# Patient Record
Sex: Male | Born: 1950 | Race: Black or African American | Hispanic: No | Marital: Married | Smoking: Current some day smoker
Health system: Southern US, Community
[De-identification: ages and names within clinical notes are randomized; demographics above are authoritative.]

## PROBLEM LIST (undated history)

## (undated) DIAGNOSIS — I2699 Other pulmonary embolism without acute cor pulmonale: Secondary | ICD-10-CM

## (undated) DIAGNOSIS — J45909 Unspecified asthma, uncomplicated: Secondary | ICD-10-CM

## (undated) DIAGNOSIS — M1990 Unspecified osteoarthritis, unspecified site: Secondary | ICD-10-CM

## (undated) DIAGNOSIS — I10 Essential (primary) hypertension: Secondary | ICD-10-CM

## (undated) DIAGNOSIS — K219 Gastro-esophageal reflux disease without esophagitis: Secondary | ICD-10-CM

## (undated) DIAGNOSIS — Z973 Presence of spectacles and contact lenses: Secondary | ICD-10-CM

## (undated) DIAGNOSIS — L309 Dermatitis, unspecified: Secondary | ICD-10-CM

## (undated) DIAGNOSIS — E785 Hyperlipidemia, unspecified: Secondary | ICD-10-CM

## (undated) DIAGNOSIS — M316 Other giant cell arteritis: Secondary | ICD-10-CM

## (undated) HISTORY — PX: WISDOM TOOTH EXTRACTION: SHX21

## (undated) HISTORY — PX: HERNIA REPAIR: SHX51

## (undated) HISTORY — DX: Hyperlipidemia, unspecified: E78.5

## (undated) HISTORY — DX: Other giant cell arteritis: M31.6

## (undated) HISTORY — PX: EYE SURGERY: SHX253

## (undated) HISTORY — DX: Dermatitis, unspecified: L30.9

## (undated) HISTORY — PX: TONSILLECTOMY: SUR1361

## (undated) HISTORY — DX: Gastro-esophageal reflux disease without esophagitis: K21.9

## (undated) HISTORY — DX: Essential (primary) hypertension: I10

---

## 1983-10-26 HISTORY — PX: TRACHEAL SURGERY: SHX1096

## 1998-10-25 HISTORY — PX: KNEE SURGERY: SHX244

## 1999-10-26 HISTORY — PX: UMBILICAL HERNIA REPAIR: SHX196

## 2001-10-16 ENCOUNTER — Ambulatory Visit (HOSPITAL_COMMUNITY): Admission: RE | Admit: 2001-10-16 | Discharge: 2001-10-16 | Payer: Self-pay | Admitting: Gastroenterology

## 2002-10-25 DIAGNOSIS — G4730 Sleep apnea, unspecified: Secondary | ICD-10-CM

## 2002-10-25 HISTORY — DX: Sleep apnea, unspecified: G47.30

## 2003-10-26 HISTORY — PX: NASAL SEPTUM SURGERY: SHX37

## 2003-11-28 ENCOUNTER — Ambulatory Visit (HOSPITAL_BASED_OUTPATIENT_CLINIC_OR_DEPARTMENT_OTHER): Admission: RE | Admit: 2003-11-28 | Discharge: 2003-11-28 | Payer: Self-pay | Admitting: Otolaryngology

## 2004-11-27 ENCOUNTER — Ambulatory Visit (HOSPITAL_COMMUNITY): Admission: RE | Admit: 2004-11-27 | Discharge: 2004-11-28 | Payer: Self-pay | Admitting: Otolaryngology

## 2005-04-19 ENCOUNTER — Ambulatory Visit: Admission: RE | Admit: 2005-04-19 | Discharge: 2005-04-19 | Payer: Self-pay | Admitting: Family Medicine

## 2005-04-19 ENCOUNTER — Ambulatory Visit (HOSPITAL_BASED_OUTPATIENT_CLINIC_OR_DEPARTMENT_OTHER): Admission: RE | Admit: 2005-04-19 | Discharge: 2005-04-19 | Payer: Self-pay | Admitting: Otolaryngology

## 2005-04-25 ENCOUNTER — Ambulatory Visit: Payer: Self-pay | Admitting: Internal Medicine

## 2010-10-25 HISTORY — PX: TOTAL KNEE ARTHROPLASTY: SHX125

## 2010-10-25 HISTORY — PX: JOINT REPLACEMENT: SHX530

## 2010-10-29 LAB — DIFFERENTIAL
Basophils Absolute: 0 10*3/uL (ref 0.0–0.1)
Basophils Relative: 1 % (ref 0–1)
Eosinophils Absolute: 0.3 10*3/uL (ref 0.0–0.7)
Eosinophils Relative: 7 % — ABNORMAL HIGH (ref 0–5)
Lymphocytes Relative: 24 % (ref 12–46)
Lymphs Abs: 1.1 10*3/uL (ref 0.7–4.0)
Monocytes Absolute: 0.4 10*3/uL (ref 0.1–1.0)
Monocytes Relative: 8 % (ref 3–12)
Neutro Abs: 2.8 10*3/uL (ref 1.7–7.7)
Neutrophils Relative %: 61 % (ref 43–77)

## 2010-10-29 LAB — COMPREHENSIVE METABOLIC PANEL
ALT: 29 U/L (ref 0–53)
AST: 27 U/L (ref 0–37)
Albumin: 4 g/dL (ref 3.5–5.2)
Alkaline Phosphatase: 64 U/L (ref 39–117)
BUN: 16 mg/dL (ref 6–23)
CO2: 26 mEq/L (ref 19–32)
Calcium: 10 mg/dL (ref 8.4–10.5)
Chloride: 107 mEq/L (ref 96–112)
Creatinine, Ser: 1.48 mg/dL (ref 0.4–1.5)
GFR calc Af Amer: 59 mL/min — ABNORMAL LOW (ref >60)
GFR calc non Af Amer: 48 mL/min — ABNORMAL LOW (ref >60)
Glucose, Bld: 87 mg/dL (ref 70–99)
Potassium: 4.2 mEq/L (ref 3.5–5.1)
Sodium: 139 mEq/L (ref 135–145)
Total Bilirubin: 1.1 mg/dL (ref 0.3–1.2)
Total Protein: 7.6 g/dL (ref 6.0–8.3)

## 2010-10-29 LAB — URINALYSIS, ROUTINE W REFLEX MICROSCOPIC
Bilirubin Urine: NEGATIVE
Hemoglobin, Urine: NEGATIVE
Ketones, ur: NEGATIVE mg/dL
Nitrite: NEGATIVE
Protein, ur: NEGATIVE mg/dL
Specific Gravity, Urine: 1.019 (ref 1.005–1.030)
Urine Glucose, Fasting: NEGATIVE mg/dL
Urobilinogen, UA: 1 mg/dL (ref 0.0–1.0)
pH: 7 (ref 5.0–8.0)

## 2010-10-29 LAB — PROTIME-INR
INR: 0.99 (ref 0.00–1.49)
Prothrombin Time: 13.3 seconds (ref 11.6–15.2)

## 2010-10-29 LAB — CBC
HCT: 43.3 % (ref 39.0–52.0)
Hemoglobin: 14.6 g/dL (ref 13.0–17.0)
MCH: 31.1 pg (ref 26.0–34.0)
MCHC: 33.7 g/dL (ref 30.0–36.0)
MCV: 92.1 fL (ref 78.0–100.0)
Platelets: 182 10*3/uL (ref 150–400)
RBC: 4.7 MIL/uL (ref 4.22–5.81)
RDW: 13.1 % (ref 11.5–15.5)
WBC: 4.6 10*3/uL (ref 4.0–10.5)

## 2010-10-29 LAB — APTT: aPTT: 29 seconds (ref 24–37)

## 2010-11-04 ENCOUNTER — Inpatient Hospital Stay (HOSPITAL_COMMUNITY)
Admission: RE | Admit: 2010-11-04 | Discharge: 2010-11-08 | Payer: Self-pay | Source: Home / Self Care | Attending: Orthopedic Surgery | Admitting: Orthopedic Surgery

## 2010-11-09 LAB — COMPREHENSIVE METABOLIC PANEL
ALT: 19 U/L (ref 0–53)
AST: 23 U/L (ref 0–37)
Albumin: 2.8 g/dL — ABNORMAL LOW (ref 3.5–5.2)
Alkaline Phosphatase: 53 U/L (ref 39–117)
BUN: 12 mg/dL (ref 6–23)
CO2: 29 mEq/L (ref 19–32)
Calcium: 9.1 mg/dL (ref 8.4–10.5)
Chloride: 97 mEq/L (ref 96–112)
Creatinine, Ser: 1.37 mg/dL (ref 0.4–1.5)
GFR calc Af Amer: >60 mL/min (ref >60)
GFR calc non Af Amer: 53 mL/min — ABNORMAL LOW (ref >60)
Glucose, Bld: 114 mg/dL — ABNORMAL HIGH (ref 70–99)
Potassium: 3.8 mEq/L (ref 3.5–5.1)
Sodium: 134 mEq/L — ABNORMAL LOW (ref 135–145)
Total Bilirubin: 1.5 mg/dL — ABNORMAL HIGH (ref 0.3–1.2)
Total Protein: 6.4 g/dL (ref 6.0–8.3)

## 2010-11-09 LAB — DIFFERENTIAL
Basophils Absolute: 0 10*3/uL (ref 0.0–0.1)
Basophils Relative: 0 % (ref 0–1)
Eosinophils Absolute: 0.1 10*3/uL (ref 0.0–0.7)
Eosinophils Relative: 1 % (ref 0–5)
Lymphocytes Relative: 4 % — ABNORMAL LOW (ref 12–46)
Lymphs Abs: 0.5 10*3/uL — ABNORMAL LOW (ref 0.7–4.0)
Monocytes Absolute: 1.2 10*3/uL — ABNORMAL HIGH (ref 0.1–1.0)
Monocytes Relative: 9 % (ref 3–12)
Neutro Abs: 11 10*3/uL — ABNORMAL HIGH (ref 1.7–7.7)
Neutrophils Relative %: 86 % — ABNORMAL HIGH (ref 43–77)

## 2010-11-09 LAB — CBC
HCT: 29.3 % — ABNORMAL LOW (ref 39.0–52.0)
HCT: 25.2 % — ABNORMAL LOW (ref 39.0–52.0)
Hemoglobin: 10.2 g/dL — ABNORMAL LOW (ref 13.0–17.0)
Hemoglobin: 8.4 g/dL — ABNORMAL LOW (ref 13.0–17.0)
MCH: 32 pg (ref 26.0–34.0)
MCH: 31 pg (ref 26.0–34.0)
MCHC: 34.8 g/dL (ref 30.0–36.0)
MCHC: 33.3 g/dL (ref 30.0–36.0)
MCV: 91.8 fL (ref 78.0–100.0)
MCV: 93 fL (ref 78.0–100.0)
Platelets: 122 10*3/uL — ABNORMAL LOW (ref 150–400)
Platelets: 156 10*3/uL (ref 150–400)
RBC: 3.19 MIL/uL — ABNORMAL LOW (ref 4.22–5.81)
RBC: 2.71 MIL/uL — ABNORMAL LOW (ref 4.22–5.81)
RDW: 13.1 % (ref 11.5–15.5)
RDW: 13.2 % (ref 11.5–15.5)
WBC: 12.7 10*3/uL — ABNORMAL HIGH (ref 4.0–10.5)
WBC: 10.8 10*3/uL — ABNORMAL HIGH (ref 4.0–10.5)

## 2010-11-09 LAB — URINE MICROSCOPIC-ADD ON

## 2010-11-09 LAB — PROTIME-INR
INR: 1.08 (ref 0.00–1.49)
INR: 1.65 — ABNORMAL HIGH (ref 0.00–1.49)
INR: 1.56 — ABNORMAL HIGH (ref 0.00–1.49)
INR: 1.37 (ref 0.00–1.49)
Prothrombin Time: 14.2 seconds (ref 11.6–15.2)
Prothrombin Time: 19.7 seconds — ABNORMAL HIGH (ref 11.6–15.2)
Prothrombin Time: 18.9 seconds — ABNORMAL HIGH (ref 11.6–15.2)
Prothrombin Time: 17.1 seconds — ABNORMAL HIGH (ref 11.6–15.2)

## 2010-11-09 LAB — URINALYSIS, ROUTINE W REFLEX MICROSCOPIC
Bilirubin Urine: NEGATIVE
Ketones, ur: NEGATIVE mg/dL
Leukocytes, UA: NEGATIVE
Nitrite: NEGATIVE
Protein, ur: NEGATIVE mg/dL
Specific Gravity, Urine: 1.024 (ref 1.005–1.030)
Urine Glucose, Fasting: NEGATIVE mg/dL
Urobilinogen, UA: 0.2 mg/dL (ref 0.0–1.0)
pH: 7.5 (ref 5.0–8.0)

## 2010-11-09 LAB — URINE CULTURE
Colony Count: NO GROWTH
Culture  Setup Time: 201201140141
Culture: NO GROWTH
Special Requests: NEGATIVE

## 2010-11-09 LAB — HEMOGLOBIN AND HEMATOCRIT, BLOOD
HCT: 36 % — ABNORMAL LOW (ref 39.0–52.0)
HCT: 30.3 % — ABNORMAL LOW (ref 39.0–52.0)
HCT: 27.2 % — ABNORMAL LOW (ref 39.0–52.0)
Hemoglobin: 12 g/dL — ABNORMAL LOW (ref 13.0–17.0)
Hemoglobin: 10.1 g/dL — ABNORMAL LOW (ref 13.0–17.0)
Hemoglobin: 9.2 g/dL — ABNORMAL LOW (ref 13.0–17.0)

## 2010-11-09 LAB — ABO/RH: ABO/RH(D): O POS

## 2010-11-09 LAB — TYPE AND SCREEN
ABO/RH(D): O POS
Antibody Screen: NEGATIVE

## 2010-11-09 LAB — TSH: TSH: 0.779 u[IU]/mL (ref 0.350–4.500)

## 2010-11-19 LAB — SURGICAL PCR SCREEN: MRSA, PCR: NEGATIVE

## 2010-11-29 NOTE — Consult Note (Signed)
NAME:  Jesse [name], Jesse [name] NO.:  [id number]  MEDICAL RECORD NO.:  [id number]          PATIENT TYPE:  INP  LOCATION:  1612                         FACILITY:  [facility]  PHYSICIAN:  Jesse Scott, MD     DATE OF BIRTH:  09-12-1951  DATE OF CONSULTATION:  11/06/2010 DATE OF DISCHARGE:                                CONSULTATION   PRIMARY CARE PHYSICIAN:  C. Duane Lope, MD  REFERRING PHYSICIAN:  Georges Lynch. Gioffre, MD  REASON FOR CONSULTATION:  Tachycardia and transient hypoxemia.  CHIEF COMPLAINT:  Right knee pain.  HISTORY OF PRESENT ILLNESS:  Jesse [name] is a pleasant 60 year old African American male patient with history of hypertension, remote pulmonary embolism after arthroscopy who completed 6 months of anticoagulation in 2000 and 2001, who had right total knee arthroplasty by Jesse [name] on the 11th of January 2012 after medical clearance.  Today, the [facility] were asked to consult because of tachycardia and transient hypoxemia.  The patient indicates that he had significant right knee pain yesterday, which was almost 10/10 and hence was receiving significant amount of pain medications, which made him "oozy" through this morning.  He, however, denies having any chest pain, dyspnea, palpitations  at any time.  He indicates that his right knee pain is significantly better to 4/10.  He was out of bed to chair and ambulated to the bathroom with physical therapy and felt slightly dizzy on ambulation, but did not pass out.  The patient was noted to have an oxygen saturation of 87% on room air with a heart rate of 116 while in bed and after sitting at the edge of the bed, his sats were 90% and heart rate went up to 130.  Subsequently, they moved him to be a recliner when his oxygen saturations were 87% with a heart rate of 121. These findings were discussed with the orthopedic nurse practitioner who consulted the [facility] for evaluation and management.   As indicated above, apart from mild right knee pain, which is significantly better than yesterday,the patient denies any complaints.  He indicates that he is urinating frequently.  He indicates his appetite is fair and has been drinking liquids.  He has passed flatus, but has not had a BM. The patient denies any cough or nausea or vomiting or dysuria.  PAST MEDICAL HISTORY: 1. Hypertension. 2. Pulmonary embolism after arthroscopy in 2000 for which the patient     completed 6 months of anticoagulation. 3. Gastroesophageal reflux disease, apparently resolved. 4. Obstructive sleep apnea, resolved after he had a nasal septoplasty     in 2005. 5. Diverticulosis on colonoscopy. 6. Mild pulmonary sarcoid in 1985.  PAST SURGICAL HISTORY: 1. Nasal septoplasty in 2005. 2. Strabismus surgery in 1959. 3. Tonsillectomy in 1969. 4. Teeth extraction followed by emergency tracheostomy with for what     looks like retropharyngeal abscess in 1986. 5. Right knee meniscectomy in 2000. 6. Umbilical hernia repair in 2001. 7. Colonoscopy in 2010, which revealed diverticulosis.  ALLERGIES:  The patient is allergic to ADHESIVE TAPES, but does tolerate paper tapes.  HOME MEDICATIONS: 1. Aspirin enteric coated  81 mg p.o. daily. 2. Zyrtec 10 mg p.o. daily p.r.n. 3. Aleve 440 mg p.o. daily. 4. Hydrochlorothiazide 12.5 mg p.o. daily.  Current medications in the hospital include: Colace, Ferrous sulfate,Heparin for DVT prophylaxis, Hydrochlorothiazide, Coumadin, Tylenol, aluminum hydroxide suspension, Dulcolax,Benadryl p.r.n, Other p.r.n. medications such as Cepacol, Robaxin, Reglan,  Zofran, Percocet, Bowel regimens.  FAMILY HISTORY:  The patient's father died at age 33 years.  He had prostate cancer.  The patient's mother died at age 60 years and she had ALS.  SOCIAL HISTORY:  The patient is married.  He is independent of activities of daily living prior to surgery.  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  He is occasionally smoked and has quit more than a year ago. He drinks social alcohol socially including scotch.  There is no history of drug abuse.  ADVANCE DIRECTIVES:  The patient is a full code.  REVIEW OF SYSTEMS:  All systems reviewed and apart from history of presenting illness is negative.  PHYSICAL EXAMINATION:  GENERAL:  Jesse [name] is a moderately built and nourished male, the patient who is in no obvious distress. VITAL SIGNS:  Temperature 99.7 degrees Fahrenheit, pulse of 121 per minute, respirations 20 per minute, blood pressure 110/75 mmHg, and saturating at 94% on room air. HEENT:  Nontraumatic normocephalic.  Pupils equally reacting to light and accommodation.  Oral mucosa is mildly dry. NECK:  Supple.  No JVD or carotid bruit. LYMPHATICS:  No lymphadenopathy. RESPIRATORY:  Clear to auscultation.  No increased work of breathing. CARDIOVASCULAR:  S1 and S2 sounds heard.  Regular tachycardic.  No murmurs, rubs, gallops, or clicks. ABDOMEN:  Nondistended, nontender, soft, and bowel sounds normally heard.  No organomegaly or mass appreciated. CENTRAL NERVOUS SYSTEM:  The patient is awake, alert, oriented x3 with no focal neurological deficits. EXTREMITIES:  With right lower extremity is in continuous passive motion.  All other extremities are grade 5/5 power with no cyanosis, clubbing, or edema.  Peripheral pulse is symmetrically felt.  The back cannot be examined secondary to patient being in the CPM.  LABORATORY DATA:  1. EKG: sinus tachycardia at 134 beats per minute with PR     interval 166, and QTC of 433 milliseconds.  Axis seems to be     normal.  Question partial right bundle-branch block, but otherwise     no acute changes. 2. Blood tests, INR is 1.65.  Hemoglobin is 10.1, hematocrit 30.3. Urinalysis on January 5th was negative and the comprehensive metabolic panel on January 5th had shown a  creatinine of 1.48, but otherwise unremarkable.  His hemoglobin on January 5th was 14.6. 3. CT Angiogram Chest:  IMPRESSION:    1.  No evidence of pulmonary embolism.   2.  Mild bilateral hilar and mediastinal lymphadenopathy with   central calcification.  Bilateral noncalcified pleural plaque, and   multiple less than 1 cm indeterminate bilateral pulmonary nodules.   Differential diagnosis includes a calcified old granulomatous   disease, sarcoidosis, pneumoconiosis such as silicosis or coal   worker's pneumoconiosis, and treated lymphoma.   ASSESSMENT AND PLAN: 1. Postoperative acute blood loss anemia.  Monitor daily CBCs.     Transfuse if hemoglobin is less than or equal to 6.9 gm/dL. 2. Sinus tachycardia, asymptomatic.  This is multifactorial secondary     to right knee pain, acute blood loss anemia.  The patient is     asymptomatic and a CT angiogram of the chest  is negative for     pulmonary embolism. The patient may also be mildly volume depleted.     We will briefly hydrate with IV fluids and hold his     hydrochlorothiazide and monitor. The patient has no overt features of     sepsis.  He has a low grade temperature, which can be attributed to     the recent surgery. Obviously if he spikes a high temperature then           consider a sepsis workup. 3. Transient hypoxemia secondary to sedation from his pain     medications.  Again, CT angiogram is negative for pulmonary     embolism.  The patient does not demonstrate any features of heart     failure.  Currently, the patient is oxygenating at 95% on room air.     We will provide incentive spirometry and minimize pain medications if     the patient becomes sedated 4. Hypertension, which is controlled, but will hold his     hydrochlorothiazide temporarily. 5. Status post right total knee arthroplasty, postoperative day #2.     The patient is on prophylactic anticoagulation as per orthopedics MD.     Management per the primary  team. 6. The patient is a full code status.   7. Pulmonary nodules and lymphadenopathy on CT chest; possibly from Sarcoidosis, but consider outpatient followup and evaluation by his primary care physician, as deemed necessary.  Time taken in coordinating this consult note was an hour.     Jesse Scott, MD     AH/MEDQ  D:  11/06/2010  T:  11/06/2010  Job:  [id number]  cc:   Jesse Lynch. Jesse [name], M.D. Fax: [phone]  Electronically Signed by Jesse Scott MD on 11/29/2010 11:41:01 AM

## 2011-03-12 NOTE — Op Note (Signed)
[facility]  Patient:    Jesse [name], Jesse [name] Visit Number: [id number] MRN: [id number]          Service Type: END Location: ENDO Attending Physician:  Louie Bun Dictated by:   Everardo All Madilyn Fireman, M.D. Proc. Date: 10/16/01 Admit Date:  10/16/2001   CC:         Dellis Anes. Idell Pickles, M.D.   Operative Report  PROCEDURE:  Colonoscopy.  INDICATIONS:  Occasional rectal bleeding a 60 year old patient with no previous colon screening.  PROCEDURE:  The patient was placed in the left lateral decubitus position and placed on the pulse monitor with continuous low flow oxygen delivered by nasal cannula. He was sedated with 80 mg IV Demerol and 6 mg IV Versed. The Olympus video colonoscope was inserted into the rectum and advanced to the cecum, confirmed by transillumination at McBurneys point and visualization of the ileocecal valve and appendiceal orifice. The prep was good. The cecum, ascending, transverse, descending, and sigmoid colon all appeared normal with no masses, polyps, diverticula or any other mucosal abnormalities. The rectum appeared normal but under the anus where only small internal hemorrhoids were seen on the retroflex view with no stigma of hemorrhage. The colonoscope was then withdrawn and the patient returned to the recovery room in stable condition. He tolerated the procedure well and there were no immediate complications.  IMPRESSION: 1. Small internal hemorrhoids. 2. Otherwise normal colonoscopy.  PLAN:  Treat hemorrhoids symptomatically and plan flexible sigmoidoscopy and Hemoccults in five years. Dictated by:   Everardo All Madilyn Fireman, M.D. Attending Physician:  Louie Bun DD:  10/16/01 TD:  10/17/01 Job: [id number]

## 2011-03-12 NOTE — Procedures (Signed)
NAME:  [name], Jesse               ACCOUNT NO.:  [id number]   MEDICAL RECORD NO.:  [id number]          PATIENT TYPE:  OUT   LOCATION:  SLEEP CENTER                 FACILITY:  [facility]   PHYSICIAN:  Clinton D. Maple Hudson, M.D. DATE OF BIRTH:  03-30-1951   DATE OF STUDY:  04/19/2005                              NOCTURNAL POLYSOMNOGRAM   REFERRING PHYSICIAN:  Onalee Hua L. Annalee Genta, M.D.   INDICATION FOR STUDY:  Hypersomnia with sleep apnea.   Epworth Sleepiness Score 9/24, BMI 31, weight 210 pounds.   Baseline diagnostic NPSG on November 28, 2003, recorded an RDI of 22 per hour  with CPAP titration to 7 CWP (RDI 5.5 per hour).  The patient is now post  surgery.   SLEEP ARCHITECTURE:  Total sleep time 339 minutes, sleep efficiency 90%.  Stage I 2%, stage II 765, stages III and IV were absent, REM 21% of total  sleep time.  Sleep latency 9 minutes, REM latency 58 minutes.  Awake after  sleep onset 18 minutes, arousal index 16.   RESPIRATORY DATA:  Respiratory disturbance index (RDI, AHI) 32.2 obstructive  events per hour indicating moderate obstructive sleep apnea/hypopnea  syndrome.  This includes 1 obstructive apnea, 1 central apnea, and 59  hypopneas before CPAP.  All sleep and all events were recorded while supine.  REM RDI 14.  CPAP titration to 11 CWP, RDI zero per hour, using a medium  Comfort Gel mask with heated humidifier.   OXYGEN DATA:  Moderate snoring with oxygen desaturation to a nadir of 63%.  After CPAP control, snoring was eliminated, and oxygen saturation held 95 to  98% on room air.   CARDIAC DATA:  Normal sinus rhythm with sinus bradycardia to 58 per minute.   MOVEMENT/PARASOMNIA:  Occasional leg jerk, insignificant.   IMPRESSION/RECOMMENDATION:  1.  Moderate obstructive sleep apnea/hypopnea syndrome, RDI 32 per hour with      moderate snoring and oxygen desaturation to 63%.  2.  Successful CPAP titration to 11 CWP, RDI zero per hour, using a medium      Comfort Gel  mask with heated      humidifier.  3.  Baseline diagnostic NPSG December 26, 2003, RDI 22 per hour.      Clinton D. Maple Hudson, M.D.  Diplomat    CDY/MEDQ  D:  04/25/2005 11:25:56  T:  04/25/2005 18:07:28  Job:  [id number]   cc:   Onalee Hua L. Annalee Genta, M.D.  [address] [place]  [place] [address]  Fax: [phone]

## 2011-03-12 NOTE — Op Note (Signed)
NAME:  [name] [name]               ACCOUNT NO.:  [id number]   MEDICAL RECORD NO.:  [id number]          PATIENT TYPE:  OIB   LOCATION:  2550                         FACILITY:  [facility]   PHYSICIAN:  Kinnie Scales. Annalee Genta, M.D.DATE OF BIRTH:  October 30, 1950   DATE OF PROCEDURE:  11/27/2004  DATE OF DISCHARGE:                                 OPERATIVE REPORT   PREOPERATIVE DIAGNOSES:  1.  Deviated nasal septum with nasal obstruction.  2.  Inferior turbinate hypertrophy.  3.  Mild obstructive sleep apnea.   POSTOPERATIVE DIAGNOSES:  1.  Deviated nasal septum with nasal obstruction.  2.  Inferior turbinate hypertrophy.  3.  Mild obstructive sleep apnea.   INDICATION FOR PROCEDURE:  1.  Deviated nasal septum with nasal obstruction.  2.  Inferior turbinate hypertrophy.  3.  Mild obstructive sleep apnea.   SURGICAL PROCEDURE:  1.  Nasal septoplasty.  2.  Bilateral inferior turbinate reduction.   SURGEON:  Kinnie Scales. Annalee Genta, M.D.   ANESTHESIA:  General endotracheal.   COMPLICATIONS:  None.   BLOOD LOSS:  Less than 50 mL.   DISPOSITION:  Patient transferred from the operating room to the recovery  room in stable condition.   BRIEF HISTORY:  [name] [name] is a 60 year old black male who is referred for  evaluation of nasal airway obstruction.  He had undergone sleep study which  showed mild obstructive sleep apnea.  The patient was unable to tolerate  CPAP use because of severely deviated septum and nasal airway obstruction  and given his history and examination, I recommended that we undertake nasal  septoplasty and turbinate reduction to improve nasal airway.  The patient  had a prior history of tracheostomy after dental extraction for dental  abscess; airway is stable and no history of stridor or respiratory distress.  Given the patient's history, examination and findings, the above surgical  procedures were recommended.  The risks, benefits and possible complications  of each of these  procedures were discussed in detail with the patient, who  understood and concurred with our plan for surgery, which was scheduled as  above.   SURGICAL PROCEDURE:  The patient was brought to the operating room at [facility] OR on November 27, 2004 and placed in a supine position on  the operating table.  General endotracheal anesthesia was established  without difficulty and the patient was adequately anesthetized.  He was  injected with a total of 8 mL of 1% lidocaine and 1:100,000 dilution of  epinephrine injected in the submucosal fashion along the nasal septum and  inferior turbinates bilaterally.  The patient was then prepped and draped in  a sterile fashion and surgical procedure was begun after packing the nose  with Afrin-soaked cottonoid pledgets, which we left in place for  approximately 10 minutes to allow for vasoconstriction and anesthesia.  The  patient's nasal cavity was examined; he was found to have a severely  deviated nasal septum with right septal spurring and near-complete  obstruction.  He also had very large inferior turbinate hypertrophy.  Beginning on the patient's right-hand side, a hemitransfixion  incision was  created and a mucoperichondrial flap was elevated from anterior to posterior  along the right-hand side.  Bony-cartilaginous junction was crossed in the  midline and a mucoperiosteal flap was elevated along the patient's left-hand  side.  Deviated bone and cartilage in the mid and posterior aspects of the  nasal septum were resected.  A large bony septal spur was resected using a 4-  mm osteotome, preserving the overlying mucosa.  The mid-septal cartilage was  morcelized and returned to the mucoperichondrial pocket and the  mucoperichondrial flaps were reapproximated with a 4-0 gut suture on a Keith  needle in a horizontal mattress suture fashion.  With the septum brought to  the midline, bilateral Doyle nasal septal splints were placed  after the  application of Bactroban ointment and sutured in position with a 3-0  Ethibond.   Attention was then turned to the patient's inferior turbinates, where  bilateral inferior turbinate reduction was undertaken.  Bipolar intramural  cautery was then used in a submucosal fashion.  Two passes were made in each  inferior turbinate at a setting of 12 watts in order to cauterize the  submucosal tissue.  The anterior and inferior aspect of the inferior  turbinates was then partially resected using through-cutting forceps to  remove excessive mucosa and bone.  The patient tolerated the procedure  without difficulty.  His nasal cavity and nasopharynx were then irrigated  and suctioned.  An orogastric tube was passed and stomach contents were  aspirated.  The patient was awakened from his anesthetic, he was extubated  and then was transferred from the operating room to the recovery room in  stable condition.  There were no complications.  Blood loss less than 50 mL.      DLS/MEDQ  D:  81/19/1478  T:  11/27/2004  Job:  [id number]

## 2011-03-12 NOTE — Discharge Summary (Signed)
NAME:  Jesse [name], Jesse [name]               ACCOUNT NO.:  [id number]   MEDICAL RECORD NO.:  [id number]          PATIENT TYPE:  OIB   LOCATION:  3302                         FACILITY:  [facility]   PHYSICIAN:  Kinnie Scales. Annalee Genta, M.D.DATE OF BIRTH:  1951-04-02   DATE OF ADMISSION:  11/27/2004  DATE OF DISCHARGE:  11/28/2004                                 DISCHARGE SUMMARY   ADMISSION DIAGNOSES:  1.  Obstructive sleep apnea.  2.  Nasal airway obstruction.  3.  Hypertension.   DISCHARGE DIAGNOSES:  1.  Obstructive sleep apnea.  2.  Nasal airway obstruction.  3.  Hypertension.   SURGICAL PROCEDURES THIS ADMISSION:  1.  Nasal septoplasty.  2.  Bilateral interior turbinate reduction performed on November 27, 2004.   The patient discharged home in stable condition in the company of his  family.   DISCHARGE MEDICATIONS:  1.  Darvocet-N 100 1-2 p.o. q.4-6 hours p.r.n. dispense 30 without refills.  2.  Augmentin 500 mg p.o. b.i.d. for 10 days.  3.  Hydrochlorothiazide 25 mg p.o. daily.  4.  Nexium 40 mg p.o. daily.   The patient is discharged home in stable condition in the company of his  family.   DIET:  No restrictions.   ACTIVITY:  Limited to no lifting, straining or nose blowing.   WOUND CARE:  Saline nasal spray four sprays in each nostril each hour while  awake.   FOLLOW UP:  The patient will follow up in my office next week for  postoperative outpatient care.   BRIEF HISTORY:  Mr. Skibinski is a 60 year old black male who was referred for  evaluation of nasal airway obstruction and obstructive sleep apnea.  The  patient attempted to wear CPAP, but was unable to wear the device secondary  to nasal airway obstructions.  Examination revealed a severely deviated  septum with turbinate hypertrophy.  Given his history, examination, and  findings, I recommended we undertake nasal septoplasty.   Turbinate reduction under general anesthesia.  The patient risks, benefits  and possible  complications of the procedure were discussed in detail.  The  patient and his family understood and concurred with our planned admission  for November 27, 2004.   HOSPITAL COURSE:  The patient was admitted to the ENT service under Dr.  Thurmon Fair care on November 27, 2004 and was taken to the Operating Room at  [facility] and under general anesthesia underwent uneventful  septoplasty and turbinate reduction.  The patient was then transferred from  the operating room to recovery and after appropriate recovery period, was  transferred to Unit 3300 for postoperative monitoring.   The patients postoperative course was uneventful, he was tolerating normal  oral diet without difficulty, complained of mild congestion with minimal  nasal discharge.  The patients oxygen saturation was stable throughout the  night on continuous pulse oximetry and cardiac monitoring, there was no  evidence of obstructive sleep apnea on humidified room air.  The patient was  discharged home in stable condition in the company of his family.  He has  normal bowel or bladder function, normal  ambulation.  He will follow up in  my office in one week for postoperative care.      DLS/MEDQ  D:  16/07/9603  T:  11/28/2004  Job:  [id number]

## 2012-11-23 ENCOUNTER — Encounter (INDEPENDENT_AMBULATORY_CARE_PROVIDER_SITE_OTHER): Payer: Self-pay | Admitting: General Surgery

## 2012-11-23 ENCOUNTER — Encounter (INDEPENDENT_AMBULATORY_CARE_PROVIDER_SITE_OTHER): Payer: Self-pay | Admitting: Surgery

## 2012-11-23 ENCOUNTER — Ambulatory Visit (INDEPENDENT_AMBULATORY_CARE_PROVIDER_SITE_OTHER): Payer: BC Managed Care – PPO | Admitting: Surgery

## 2012-11-23 VITALS — BP 128/82 | HR 80 | Temp 97.8°F | Resp 16 | Ht 69.5 in | Wt 196.0 lb

## 2012-11-23 DIAGNOSIS — K611 Rectal abscess: Secondary | ICD-10-CM

## 2012-11-23 DIAGNOSIS — K612 Anorectal abscess: Secondary | ICD-10-CM

## 2012-11-23 DIAGNOSIS — L0231 Cutaneous abscess of buttock: Secondary | ICD-10-CM | POA: Insufficient documentation

## 2012-11-23 DIAGNOSIS — L03317 Cellulitis of buttock: Secondary | ICD-10-CM

## 2012-11-23 MED ORDER — DOXYCYCLINE HYCLATE 100 MG PO TABS: 100.0000 mg | ORAL_TABLET | Freq: Two times a day (BID) | ORAL | Status: DC

## 2012-11-23 MED ORDER — HYDROCODONE-ACETAMINOPHEN 5-325 MG PO TABS: 1.0000 | ORAL_TABLET | ORAL | Status: DC | PRN

## 2012-11-23 NOTE — Progress Notes (Signed)
CC:  Chief Complaint  Patient presents with  . Abscess    rectal    HPI: This patient has a right buttock abscess that developed over a few days. It was I&D at his primary physician office 2 days ago and he was started on Augmentin. A slightly larger I&D was done today but felt that surgical consultation needed. He notes he feels a bit better but the area is still painful and hard.  PMH:  Past Medical History  Diagnosis Date  . HTN (hypertension)    Meds:  Current Outpatient Prescriptions  Medication Sig Dispense Refill  . amoxicillin-clavulanate (AUGMENTIN) 875-125 MG per tablet Take 1 tablet by mouth 2 (two) times daily.      Marland Kitchen aspirin 325 MG tablet Take 325 mg by mouth daily.      . hydrochlorothiazide (HYDRODIURIL) 25 MG tablet Take 25 mg by mouth daily.      . Misc Natural Products (PROSTATE SUPPORT PO) Take by mouth daily.      . Multiple Vitamin (MULTIVITAMIN) tablet Take 1 tablet by mouth daily.      Marland Kitchen doxycycline (VIBRA-TABS) 100 MG tablet Take 1 tablet (100 mg total) by mouth 2 (two) times daily.  14 tablet  2  . HYDROcodone-acetaminophen (NORCO) 5-325 MG per tablet Take 1 tablet by mouth every 4 (four) hours as needed for pain.  10 tablet  0   Allergy:  Allergies  Allergen Reactions  . Oxycodone Itching     EXAM:  VS: BP 128/82  Pulse 80  Temp 97.8 F (36.6 C) (Temporal)  Resp 16  Ht 5' 9.5" (1.765 m)  Wt 196 lb (88.905 kg)  BMI 28.53 kg/m2 General: Alert, NAD Skin: Open area Right buttock with some old blood extruding. Five cm area of induration noted, mildly tender.   IMP: Buttock abscess, partially drained, I am suspicious this is MRSA  PLAN: Recommended additional drainage. He agrees  PROCEDURE: The area is anesthetized with 10 cc 1% xylo with Epi. 10 minute wait for Epi effect. Took elliptical area of skiin and entered an abscess cavity. Some old clot present, no much pus. Area packed.   He will begin sitz and repck tomorrow. Will start Doxy and stop  augmentin. Gave him Rx for some Vicodin

## 2012-11-25 ENCOUNTER — Telehealth (INDEPENDENT_AMBULATORY_CARE_PROVIDER_SITE_OTHER): Payer: Self-pay | Admitting: General Surgery

## 2012-11-25 NOTE — Telephone Encounter (Signed)
He called and asked if he could shower.  He took the packing out of the right buttock wound today.  I told him he could shower and recommend it do it and clean the wound twice a day then place a pad in his underwear.

## 2012-11-26 LAB — WOUND CULTURE

## 2012-12-21 ENCOUNTER — Ambulatory Visit (INDEPENDENT_AMBULATORY_CARE_PROVIDER_SITE_OTHER): Payer: BC Managed Care – PPO | Admitting: General Surgery

## 2012-12-21 ENCOUNTER — Encounter (INDEPENDENT_AMBULATORY_CARE_PROVIDER_SITE_OTHER): Payer: BC Managed Care – PPO | Admitting: General Surgery

## 2012-12-21 ENCOUNTER — Encounter (INDEPENDENT_AMBULATORY_CARE_PROVIDER_SITE_OTHER): Payer: Self-pay | Admitting: General Surgery

## 2012-12-21 VITALS — BP 136/82 | HR 86 | Temp 98.3°F | Resp 18 | Ht 69.5 in | Wt 197.0 lb

## 2012-12-21 DIAGNOSIS — L0231 Cutaneous abscess of buttock: Secondary | ICD-10-CM

## 2012-12-21 MED ORDER — SULFAMETHOXAZOLE-TRIMETHOPRIM 400-80 MG PO TABS: 1.0000 | ORAL_TABLET | Freq: Two times a day (BID) | ORAL | Status: AC

## 2012-12-21 NOTE — Progress Notes (Signed)
Subjective:     Patient ID: Jesse [name], male   DOB: 18-Jun-1951, 62 y.o.   MRN: [id number]  HPI Patient is a 62 year old male who presents with around 4 days of perirectal pain on the right. He had a perianal abscess drained a while back by Dr. Jamey Ripa.  He has been placing ointment on it to try to get this to come surface. He did get a small amount of drainage today, but he still has a lot of pain. He is having trouble sitting on that side. He denies fevers and chills. The area that is swollen is inferior to the one that occurred before.  Review of Systems  All other systems reviewed and are negative.       Objective:   Physical Exam  Constitutional: He is oriented to person, place, and time. He appears well-developed and well-nourished. No distress.  HENT:  Head: Normocephalic and atraumatic.  Eyes: Conjunctivae are normal. Pupils are equal, round, and reactive to light. No scleral icterus.  Neck: Normal range of motion. Neck supple.  Cardiovascular: Normal rate and regular rhythm.   Pulmonary/Chest: Effort normal. No respiratory distress.  Abdominal: Soft.  Genitourinary:     3-4 cm induration.  Punctate area of drainage.  Musculoskeletal: Normal range of motion.  Neurological: He is alert and oriented to person, place, and time. Coordination normal.  Skin: Skin is warm and dry. He is not diaphoretic.  Psychiatric: He has a normal mood and affect. His behavior is normal. Judgment and thought content normal.   The area in question was cleaned, anesthetized, and incised with #11 blade. Small amount of pus expressed. The wound was probed with a hemostat.  1/4" Nugauze packed into wound.       Assessment/Plan:     Abscess of right buttock I&D, culture, Bactrim.    Follow up in 2-4 weeks. Advised the patient to remove the packing in 48 hours.

## 2012-12-21 NOTE — Assessment & Plan Note (Addendum)
I&D, culture, Bactrim.    Follow up in 2-4 weeks. Advised the patient to remove the packing in 48 hours.

## 2012-12-21 NOTE — Patient Instructions (Signed)
Remove packing in 48 hours.  Bathe/shower daily.  Soak wound.    Take 10 days antibiotics.  We will let you know about culture.  Follow up 2-4 weeks.

## 2012-12-24 LAB — WOUND CULTURE

## 2012-12-26 ENCOUNTER — Telehealth (INDEPENDENT_AMBULATORY_CARE_PROVIDER_SITE_OTHER): Payer: Self-pay

## 2012-12-26 NOTE — Telephone Encounter (Signed)
Pt notified of culture results.  He is doing much better.  I recommended he call if any changes, or he has any questions.  Reminded of f/u appt.

## 2013-01-11 ENCOUNTER — Telehealth (INDEPENDENT_AMBULATORY_CARE_PROVIDER_SITE_OTHER): Payer: Self-pay

## 2013-01-11 ENCOUNTER — Ambulatory Visit (INDEPENDENT_AMBULATORY_CARE_PROVIDER_SITE_OTHER): Payer: BC Managed Care – PPO | Admitting: General Surgery

## 2013-01-11 ENCOUNTER — Encounter (INDEPENDENT_AMBULATORY_CARE_PROVIDER_SITE_OTHER): Payer: Self-pay | Admitting: General Surgery

## 2013-01-11 VITALS — BP 106/86 | HR 72 | Temp 98.6°F | Resp 16 | Ht 69.0 in | Wt 198.2 lb

## 2013-01-11 DIAGNOSIS — L0231 Cutaneous abscess of buttock: Secondary | ICD-10-CM

## 2013-01-11 NOTE — Patient Instructions (Signed)
Follow up as needed.  If you start to feel a recurrent area, do neosporin and warm water baths.

## 2013-01-11 NOTE — Telephone Encounter (Signed)
LMOV pt's appt moved to 3:15 today w/ Dr. Donell Beers.  Message left on home VM and cell VM.

## 2013-01-16 NOTE — Assessment & Plan Note (Signed)
Appears to be resolved.    Follow up as needed.  No further therapy at this time.

## 2013-01-16 NOTE — Progress Notes (Signed)
HISTORY: Pt returns for follow up after I&D of perirectal abscess.  He is doing better.  He denies pain.  He has a small firm area in the site of the I&D.  He has no further drainage    EXAM: General:  Alert and oriented.   Incision:  Well healed.  Minimal palpable knot at incision.  Non tender.     ASSESSMENT AND PLAN:   Abscess of right buttock Appears to be resolved.    Follow up as needed.  No further therapy at this time.        Maudry Diego, MD Surgical Oncology, General & Endocrine Surgery [facility]  Gretel Acre, MD Gretel Acre, MD

## 2013-02-22 HISTORY — PX: ABCESS DRAINAGE: SHX399

## 2013-03-01 ENCOUNTER — Other Ambulatory Visit: Payer: Self-pay | Admitting: Family Medicine

## 2013-03-01 DIAGNOSIS — R109 Unspecified abdominal pain: Secondary | ICD-10-CM

## 2013-03-02 ENCOUNTER — Other Ambulatory Visit: Payer: Self-pay | Admitting: Family Medicine

## 2013-03-02 ENCOUNTER — Ambulatory Visit
Admission: RE | Admit: 2013-03-02 | Discharge: 2013-03-02 | Disposition: A | Discharge status: Home / Self Care | Payer: BC Managed Care – PPO | Source: Ambulatory Visit | Attending: Family Medicine | Admitting: Family Medicine

## 2013-03-02 DIAGNOSIS — R109 Unspecified abdominal pain: Secondary | ICD-10-CM

## 2013-03-05 ENCOUNTER — Encounter (INDEPENDENT_AMBULATORY_CARE_PROVIDER_SITE_OTHER): Payer: Self-pay | Admitting: Surgery

## 2013-03-06 ENCOUNTER — Ambulatory Visit (INDEPENDENT_AMBULATORY_CARE_PROVIDER_SITE_OTHER): Payer: BC Managed Care – PPO | Admitting: Surgery

## 2013-03-06 ENCOUNTER — Encounter (INDEPENDENT_AMBULATORY_CARE_PROVIDER_SITE_OTHER): Payer: Self-pay | Admitting: Surgery

## 2013-03-06 VITALS — BP 152/98 | HR 68 | Temp 97.6°F | Ht 69.5 in | Wt 196.4 lb

## 2013-03-06 DIAGNOSIS — K4090 Unilateral inguinal hernia, without obstruction or gangrene, not specified as recurrent: Secondary | ICD-10-CM | POA: Insufficient documentation

## 2013-03-06 NOTE — Progress Notes (Signed)
Patient ID: Jesse [name], male   DOB: 04-30-1951, 62 y.o.   MRN: [id number]  Chief Complaint  Patient presents with  . New Evaluation    eval RIH    HPI Jesse [name] is a 62 y.o. male.  Referred by Dr. Ihor Dow for evaluation of right inguinal hernia  HPI This is a healthy 62 year old male who presents with a one-month history of pressure in both groins with fullness of the right groin. This seems to improve after he urinates. He underwent workup for a kidney stone and this was negative. However a CT scan of the abdomen and pelvis without contrast on 03/02/13 did show a right inguinal hernia containing fat. He presents now to discuss surgical repair.  He denies any obstructive symptoms. Past Medical History  Diagnosis Date  . HTN (hypertension)   . GERD (gastroesophageal reflux disease)   . TA (temporal arteritis)   . Eczema     Past Surgical History  Procedure Laterality Date  . Eye surgery  age 32    left  . Nasal septum surgery  2005  . Knee surgery  2000    right  . Tonsillectomy  62 years old  . Umbilical hernia repair  2001  . Hernia repair    . Tracheal surgery      Family History  Problem Relation Age of Onset  . Prostate cancer Father   . Cancer Father     prostate  . Heart disease Father   . ALS Mother   . Lymphoma Son     Social History History  Substance Use Topics  . Smoking status: Light Tobacco Smoker    Last Attempt to Quit: 01/24/2012  . Smokeless tobacco: Not on file     Comment: smokes few cigs each year  . Alcohol Use: Yes     Comment: occasionally    Allergies  Allergen Reactions  . Oxycodone Itching    Current Outpatient Prescriptions  Medication Sig Dispense Refill  . albuterol (PROVENTIL HFA;VENTOLIN HFA) 108 (90 BASE) MCG/ACT inhaler Inhale 2 puffs into the lungs every 6 (six) hours as needed for wheezing.      . cetirizine (ZYRTEC) 10 MG tablet Take 10 mg by mouth daily.      . hydrochlorothiazide (HYDRODIURIL) 25 MG tablet Take  25 mg by mouth daily.      . Misc Natural Products (PROSTATE SUPPORT PO) Take by mouth daily.      . Multiple Vitamin (MULTIVITAMIN) tablet Take 1 tablet by mouth daily.       No current facility-administered medications for this visit.    Review of Systems Review of Systems  Constitutional: Negative for fever, chills and unexpected weight change.  HENT: Negative for hearing loss, congestion, sore throat, trouble swallowing and voice change.   Eyes: Negative for visual disturbance.  Respiratory: Negative for cough and wheezing.   Cardiovascular: Negative for chest pain, palpitations and leg swelling.  Gastrointestinal: Negative for nausea, vomiting, abdominal pain, diarrhea, constipation, blood in stool, abdominal distention, anal bleeding and rectal pain.  Genitourinary: Negative for hematuria and difficulty urinating.  Musculoskeletal: Negative for arthralgias.  Skin: Negative for rash and wound.  Neurological: Negative for seizures, syncope, weakness and headaches.  Hematological: Negative for adenopathy. Does not bruise/bleed easily.  Psychiatric/Behavioral: Negative for confusion.    Blood pressure 152/98, pulse 68, temperature 97.6 F (36.4 C), temperature source Temporal, height 5' 9.5" (1.765 m), weight 196 lb 6.4 oz (89.086 kg), SpO2 98.00%.  Physical Exam  Physical Exam WDWN in NAD HEENT:  EOMI, sclera anicteric Neck:  No masses, no thyromegaly Lungs:  CTA bilaterally; normal respiratory effort CV:  Regular rate and rhythm; no murmurs Abd:  +bowel sounds, soft, non-tender, no masses GU;  Bilateral descended testes; no testicular masses; right inguinal hernia - reducible; No sign of left inguinal hernia Ext:  Well-perfused; no edema Skin:  Warm, dry; no sign of jaundice  Data Reviewed CT scan 03/02/13  Assessment    Right inguinal hernia - reducible     Plan    Right inguinal hernia repair with mesh.  The surgical procedure has been discussed with the patient.   Potential risks, benefits, alternative treatments, and expected outcomes have been explained.  All of the patient's questions at this time have been answered.  The likelihood of reaching the patient's treatment goal is good.  The patient understand the proposed surgical procedure and wishes to proceed.         Vaunda Gutterman K. 03/06/2013, 12:48 PM

## 2013-05-21 ENCOUNTER — Encounter (HOSPITAL_BASED_OUTPATIENT_CLINIC_OR_DEPARTMENT_OTHER): Payer: Self-pay | Admitting: *Deleted

## 2013-05-21 NOTE — Progress Notes (Signed)
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

## 2013-05-23 ENCOUNTER — Encounter (HOSPITAL_BASED_OUTPATIENT_CLINIC_OR_DEPARTMENT_OTHER): Payer: Self-pay | Admitting: *Deleted

## 2013-05-23 ENCOUNTER — Encounter (HOSPITAL_BASED_OUTPATIENT_CLINIC_OR_DEPARTMENT_OTHER)
Admission: RE | Admit: 2013-05-23 | Discharge: 2013-05-23 | Disposition: A | Discharge status: Home / Self Care | Payer: BC Managed Care – PPO | Source: Ambulatory Visit | Attending: Surgery | Admitting: Surgery

## 2013-05-24 NOTE — H&P (Signed)
Jesse [name] is a 62 y.o. male. Referred by Dr. Ihor Dow for evaluation of right inguinal hernia  HPI  This is a healthy 62 year old male who presents with a one-month history of pressure in both groins with fullness of the right groin. This seems to improve after he urinates. He underwent workup for a kidney stone and this was negative. However a CT scan of the abdomen and pelvis without contrast on 03/02/13 did show a right inguinal hernia containing fat. He presents now to discuss surgical repair. He denies any obstructive symptoms.  Past Medical History   Diagnosis  Date   .  HTN (hypertension)    .  GERD (gastroesophageal reflux disease)    .  TA (temporal arteritis)    .  Eczema     Past Surgical History   Procedure  Laterality  Date   .  Eye surgery   age 22     left   .  Nasal septum surgery   2005   .  Knee surgery   2000     right   .  Tonsillectomy   62 years old   .  Umbilical hernia repair   2001   .  Hernia repair     .  Tracheal surgery      Family History   Problem  Relation  Age of Onset   .  Prostate cancer  Father    .  Cancer  Father      prostate   .  Heart disease  Father    .  ALS  Mother    .  Lymphoma  Son    Social History  History   Substance Use Topics   .  Smoking status:  Light Tobacco Smoker     Last Attempt to Quit:  01/24/2012   .  Smokeless tobacco:  Not on file      Comment: smokes few cigs each year   .  Alcohol Use:  Yes      Comment: occasionally    Allergies   Allergen  Reactions   .  Oxycodone  Itching    Current Outpatient Prescriptions   Medication  Sig  Dispense  Refill   .  albuterol (PROVENTIL HFA;VENTOLIN HFA) 108 (90 BASE) MCG/ACT inhaler  Inhale 2 puffs into the lungs every 6 (six) hours as needed for wheezing.     .  cetirizine (ZYRTEC) 10 MG tablet  Take 10 mg by mouth daily.     .  hydrochlorothiazide (HYDRODIURIL) 25 MG tablet  Take 25 mg by mouth daily.     .  Misc Natural Products (PROSTATE SUPPORT PO)  Take by  mouth daily.     .  Multiple Vitamin (MULTIVITAMIN) tablet  Take 1 tablet by mouth daily.      No current facility-administered medications for this visit.   Review of Systems  Review of Systems  Constitutional: Negative for fever, chills and unexpected weight change.  HENT: Negative for hearing loss, congestion, sore throat, trouble swallowing and voice change.  Eyes: Negative for visual disturbance.  Respiratory: Negative for cough and wheezing.  Cardiovascular: Negative for chest pain, palpitations and leg swelling.  Gastrointestinal: Negative for nausea, vomiting, abdominal pain, diarrhea, constipation, blood in stool, abdominal distention, anal bleeding and rectal pain.  Genitourinary: Negative for hematuria and difficulty urinating.  Musculoskeletal: Negative for arthralgias.  Skin: Negative for rash and wound.  Neurological: Negative for seizures, syncope, weakness and headaches.  Hematological: Negative  for adenopathy. Does not bruise/bleed easily.  Psychiatric/Behavioral: Negative for confusion.  Blood pressure 152/98, pulse 68, temperature 97.6 F (36.4 C), temperature source Temporal, height 5' 9.5" (1.765 m), weight 196 lb 6.4 oz (89.086 kg), SpO2 98.00%.  Physical Exam  Physical Exam  WDWN in NAD  HEENT: EOMI, sclera anicteric  Neck: No masses, no thyromegaly  Lungs: CTA bilaterally; normal respiratory effort  CV: Regular rate and rhythm; no murmurs  Abd: +bowel sounds, soft, non-tender, no masses  GU; Bilateral descended testes; no testicular masses; right inguinal hernia - reducible;  No sign of left inguinal hernia  Ext: Well-perfused; no edema  Skin: Warm, dry; no sign of jaundice  Data Reviewed  CT scan 03/02/13  Assessment  Right inguinal hernia - reducible  Plan  Right inguinal hernia repair with mesh. The surgical procedure has been discussed with the patient. Potential risks, benefits, alternative treatments, and expected outcomes have been explained. All of  the patient's questions at this time have been answered. The likelihood of reaching the patient's treatment goal is good. The patient understand the proposed surgical procedure and wishes to proceed.    Wilmon Arms. Corliss Skains, MD, [facility]  General/ Trauma Surgery  05/24/2013 10:15 PM

## 2013-05-25 ENCOUNTER — Encounter (HOSPITAL_BASED_OUTPATIENT_CLINIC_OR_DEPARTMENT_OTHER): Payer: Self-pay

## 2013-05-25 ENCOUNTER — Ambulatory Visit (HOSPITAL_BASED_OUTPATIENT_CLINIC_OR_DEPARTMENT_OTHER): Payer: BC Managed Care – PPO | Admitting: Anesthesiology

## 2013-05-25 ENCOUNTER — Ambulatory Visit (HOSPITAL_BASED_OUTPATIENT_CLINIC_OR_DEPARTMENT_OTHER)
Admission: RE | Admit: 2013-05-25 | Discharge: 2013-05-25 | Disposition: A | Discharge status: Home / Self Care | Payer: BC Managed Care – PPO | Source: Ambulatory Visit | Attending: Surgery | Admitting: Surgery

## 2013-05-25 ENCOUNTER — Encounter (HOSPITAL_BASED_OUTPATIENT_CLINIC_OR_DEPARTMENT_OTHER)
Admission: RE | Disposition: A | Discharge status: Home / Self Care | Payer: Self-pay | Source: Ambulatory Visit | Attending: Surgery

## 2013-05-25 ENCOUNTER — Encounter (HOSPITAL_BASED_OUTPATIENT_CLINIC_OR_DEPARTMENT_OTHER): Payer: Self-pay | Admitting: Anesthesiology

## 2013-05-25 DIAGNOSIS — F17200 Nicotine dependence, unspecified, uncomplicated: Secondary | ICD-10-CM | POA: Insufficient documentation

## 2013-05-25 DIAGNOSIS — Z79899 Other long term (current) drug therapy: Secondary | ICD-10-CM | POA: Insufficient documentation

## 2013-05-25 DIAGNOSIS — M316 Other giant cell arteritis: Secondary | ICD-10-CM | POA: Insufficient documentation

## 2013-05-25 DIAGNOSIS — K4090 Unilateral inguinal hernia, without obstruction or gangrene, not specified as recurrent: Secondary | ICD-10-CM | POA: Insufficient documentation

## 2013-05-25 DIAGNOSIS — D176 Benign lipomatous neoplasm of spermatic cord: Secondary | ICD-10-CM | POA: Insufficient documentation

## 2013-05-25 DIAGNOSIS — L259 Unspecified contact dermatitis, unspecified cause: Secondary | ICD-10-CM | POA: Insufficient documentation

## 2013-05-25 DIAGNOSIS — I10 Essential (primary) hypertension: Secondary | ICD-10-CM | POA: Insufficient documentation

## 2013-05-25 DIAGNOSIS — Z885 Allergy status to narcotic agent status: Secondary | ICD-10-CM | POA: Insufficient documentation

## 2013-05-25 DIAGNOSIS — K219 Gastro-esophageal reflux disease without esophagitis: Secondary | ICD-10-CM | POA: Insufficient documentation

## 2013-05-25 HISTORY — DX: Unspecified osteoarthritis, unspecified site: M19.90

## 2013-05-25 HISTORY — DX: Unspecified asthma, uncomplicated: J45.909

## 2013-05-25 HISTORY — PX: INSERTION OF MESH: SHX5868

## 2013-05-25 HISTORY — PX: INGUINAL HERNIA REPAIR: SHX194

## 2013-05-25 HISTORY — DX: Other pulmonary embolism without acute cor pulmonale: I26.99

## 2013-05-25 HISTORY — DX: Presence of spectacles and contact lenses: Z97.3

## 2013-05-25 SURGERY — REPAIR, HERNIA, INGUINAL, ADULT
Anesthesia: Regional | Site: Groin | Laterality: Right | Wound class: Clean

## 2013-05-25 MED ORDER — MORPHINE SULFATE 2 MG/ML IJ SOLN: 2.0000 mg | INTRAMUSCULAR | Status: DC | PRN

## 2013-05-25 MED ORDER — FENTANYL CITRATE 0.05 MG/ML IJ SOLN
50.0000 ug | INTRAMUSCULAR | Status: DC | PRN
  Administered 2013-05-25: 100 ug via INTRAVENOUS

## 2013-05-25 MED ORDER — CEFAZOLIN SODIUM-DEXTROSE 2-3 GM-% IV SOLR
2.0000 g | INTRAVENOUS | Status: AC
  Administered 2013-05-25: 2 g via INTRAVENOUS

## 2013-05-25 MED ORDER — HYDROCODONE-ACETAMINOPHEN 5-325 MG PO TABS: 1.0000 | ORAL_TABLET | ORAL | Status: DC | PRN

## 2013-05-25 MED ORDER — DEXAMETHASONE SODIUM PHOSPHATE 4 MG/ML IJ SOLN
INTRAMUSCULAR | Status: DC | PRN
  Administered 2013-05-25: 10 mg via INTRAVENOUS
  Administered 2013-05-25: 5 mg via INTRAVENOUS

## 2013-05-25 MED ORDER — BUPIVACAINE-EPINEPHRINE 0.25% -1:200000 IJ SOLN
INTRAMUSCULAR | Status: DC | PRN
  Administered 2013-05-25: 10 mL

## 2013-05-25 MED ORDER — BUPIVACAINE-EPINEPHRINE PF 0.5-1:200000 % IJ SOLN
INTRAMUSCULAR | Status: DC | PRN
  Administered 2013-05-25: 30 mL

## 2013-05-25 MED ORDER — MIDAZOLAM HCL 2 MG/2ML IJ SOLN
1.0000 mg | INTRAMUSCULAR | Status: DC | PRN
  Administered 2013-05-25: 2 mg via INTRAVENOUS

## 2013-05-25 MED ORDER — FENTANYL CITRATE 0.05 MG/ML IJ SOLN
INTRAMUSCULAR | Status: DC | PRN
  Administered 2013-05-25: 50 ug via INTRAVENOUS

## 2013-05-25 MED ORDER — CHLORHEXIDINE GLUCONATE 4 % EX LIQD: 1.0000 "application " | Freq: Once | CUTANEOUS | Status: DC

## 2013-05-25 MED ORDER — LACTATED RINGERS IV SOLN
INTRAVENOUS | Status: DC
  Administered 2013-05-25 (×2): via INTRAVENOUS

## 2013-05-25 MED ORDER — HYDROMORPHONE HCL PF 1 MG/ML IJ SOLN
0.2500 mg | INTRAMUSCULAR | Status: DC | PRN
  Administered 2013-05-25 (×2): 0.5 mg via INTRAVENOUS

## 2013-05-25 MED ORDER — LIDOCAINE HCL (CARDIAC) 20 MG/ML IV SOLN
INTRAVENOUS | Status: DC | PRN
  Administered 2013-05-25: 40 mg via INTRAVENOUS

## 2013-05-25 MED ORDER — PROPOFOL 10 MG/ML IV BOLUS
INTRAVENOUS | Status: DC | PRN
  Administered 2013-05-25: 200 mg via INTRAVENOUS

## 2013-05-25 MED ORDER — ONDANSETRON HCL 4 MG/2ML IJ SOLN: 4.0000 mg | INTRAMUSCULAR | Status: DC | PRN

## 2013-05-25 SURGICAL SUPPLY — 52 items
APL SKNCLS STERI-STRIP NONHPOA (GAUZE/BANDAGES/DRESSINGS) ×1
BENZOIN TINCTURE PRP APPL 2/3 (GAUZE/BANDAGES/DRESSINGS) ×2 IMPLANT
BLADE HEX COATED 2.75 (ELECTRODE) ×2 IMPLANT
BLADE SURG 15 STRL LF DISP TIS (BLADE) ×1 IMPLANT
BLADE SURG 15 STRL SS (BLADE) ×2
BLADE SURG ROTATE 9660 (MISCELLANEOUS) IMPLANT
CANISTER SUCTION 1200CC (MISCELLANEOUS) IMPLANT
CHLORAPREP W/TINT 26ML (MISCELLANEOUS) ×2 IMPLANT
CLOTH BEACON ORANGE TIMEOUT ST (SAFETY) ×2 IMPLANT
COVER MAYO STAND STRL (DRAPES) ×2 IMPLANT
COVER TABLE BACK 60X90 (DRAPES) ×2 IMPLANT
DECANTER SPIKE VIAL GLASS SM (MISCELLANEOUS) ×2 IMPLANT
DRAIN PENROSE 1/2X12 LTX STRL (WOUND CARE) ×2 IMPLANT
DRAPE LAPAROTOMY TRNSV 102X78 (DRAPE) ×2 IMPLANT
DRAPE UTILITY XL STRL (DRAPES) ×2 IMPLANT
DRSG TEGADERM 4X4.75 (GAUZE/BANDAGES/DRESSINGS) ×2 IMPLANT
ELECT REM PT RETURN 9FT ADLT (ELECTROSURGICAL) ×2
ELECTRODE REM PT RTRN 9FT ADLT (ELECTROSURGICAL) ×1 IMPLANT
GAUZE SPONGE 4X4 12PLY STRL LF (GAUZE/BANDAGES/DRESSINGS) ×2 IMPLANT
GLOVE BIO SURGEON STRL SZ7 (GLOVE) ×2 IMPLANT
GLOVE BIOGEL PI IND STRL 7.5 (GLOVE) ×1 IMPLANT
GLOVE BIOGEL PI INDICATOR 7.5 (GLOVE) ×1
GLOVE ECLIPSE 6.5 STRL STRAW (GLOVE) ×1 IMPLANT
GOWN PREVENTION PLUS XLARGE (GOWN DISPOSABLE) ×4 IMPLANT
MESH PARIETEX PROGRIP RIGHT (Mesh General) ×1 IMPLANT
NDL HYPO 25X1 1.5 SAFETY (NEEDLE) ×1 IMPLANT
NEEDLE HYPO 25X1 1.5 SAFETY (NEEDLE) ×2 IMPLANT
NS IRRIG 1000ML POUR BTL (IV SOLUTION) IMPLANT
PACK BASIN DAY SURGERY FS (CUSTOM PROCEDURE TRAY) ×2 IMPLANT
PAIN PUMP ON-Q 100MLX2ML 2.5IN (PAIN MANAGEMENT) IMPLANT
PENCIL BUTTON HOLSTER BLD 10FT (ELECTRODE) ×2 IMPLANT
SLEEVE SCD COMPRESS KNEE MED (MISCELLANEOUS) ×2 IMPLANT
SPONGE INTESTINAL PEANUT (DISPOSABLE) ×2 IMPLANT
SPONGE LAP 18X18 X RAY DECT (DISPOSABLE) IMPLANT
STRIP CLOSURE SKIN 1/2X4 (GAUZE/BANDAGES/DRESSINGS) ×2 IMPLANT
SUT MON AB 4-0 PC3 18 (SUTURE) ×2 IMPLANT
SUT PDS AB 0 CT 36 (SUTURE) IMPLANT
SUT PROLENE 2 0 SH DA (SUTURE) ×2 IMPLANT
SUT SILK 2 0 SH (SUTURE) IMPLANT
SUT SILK 3 0 SH 30 (SUTURE) IMPLANT
SUT SILK 3 0 TIES 17X18 (SUTURE)
SUT SILK 3-0 18XBRD TIE BLK (SUTURE) IMPLANT
SUT VIC AB 0 SH 27 (SUTURE) IMPLANT
SUT VIC AB 2-0 SH 27 (SUTURE) ×2
SUT VIC AB 2-0 SH 27XBRD (SUTURE) ×1 IMPLANT
SUT VIC AB 3-0 SH 27 (SUTURE) ×2
SUT VIC AB 3-0 SH 27X BRD (SUTURE) ×1 IMPLANT
SYR CONTROL 10ML LL (SYRINGE) ×2 IMPLANT
TOWEL OR 17X24 6PK STRL BLUE (TOWEL DISPOSABLE) ×2 IMPLANT
TOWEL OR NON WOVEN STRL DISP B (DISPOSABLE) ×2 IMPLANT
TUBE CONNECTING 20X1/4 (TUBING) IMPLANT
YANKAUER SUCT BULB TIP NO VENT (SUCTIONS) IMPLANT

## 2013-05-25 NOTE — Interval H&P Note (Signed)
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 After consideration of risks, benefits and other options for treatment, the patient has consented to  Procedure(s): RIGHT INGUINAL HERNIA REPAIR  WITH MESH (Right) INSERTION OF MESH (Right) as a surgical intervention .  The patient's history has been reviewed, patient examined, no change in status, stable for surgery.  I have reviewed the patient's chart and labs.  Questions were answered to the patient's satisfaction.     Charlottie Peragine K.

## 2013-05-25 NOTE — Anesthesia Preprocedure Evaluation (Signed)
Anesthesia Evaluation  Patient identified by MRN, date of birth, ID band Patient awake    Reviewed: Allergy & Precautions, H&P , NPO status , Patient's Chart, lab work & pertinent test results  Airway Mallampati: III TM Distance: >3 FB Neck ROM: Full    Dental no notable dental hx. (+) Teeth Intact and Dental Advisory Given   Pulmonary asthma ,  breath sounds clear to auscultation  Pulmonary exam normal       Cardiovascular hypertension, On Medications + Peripheral Vascular Disease Rhythm:Regular Rate:Normal     Neuro/Psych negative neurological ROS  negative psych ROS   GI/Hepatic Neg liver ROS, GERD-  Controlled,  Endo/Other  negative endocrine ROS  Renal/GU negative Renal ROS  negative genitourinary   Musculoskeletal   Abdominal   Peds  Hematology negative hematology ROS (+)   Anesthesia Other Findings   Reproductive/Obstetrics negative OB ROS                           Anesthesia Physical Anesthesia Plan  ASA: II  Anesthesia Plan: General and Regional   Post-op Pain Management:    Induction: Intravenous  Airway Management Planned: LMA  Additional Equipment:   Intra-op Plan:   Post-operative Plan: Extubation in OR  Informed Consent: I have reviewed the patients History and Physical, chart, labs and discussed the procedure including the risks, benefits and alternatives for the proposed anesthesia with the patient or authorized representative who has indicated his/her understanding and acceptance.   Dental advisory given  Plan Discussed with: CRNA  Anesthesia Plan Comments:         Anesthesia Quick Evaluation

## 2013-05-25 NOTE — Transfer of Care (Signed)
Immediate Anesthesia Transfer of Care Note  Patient: Jesse [name]  Procedure(s) Performed: Procedure(s): RIGHT INGUINAL HERNIA REPAIR  WITH MESH (Right) INSERTION OF MESH (Right)  Patient Location: PACU  Anesthesia Type:GA combined with regional for post-op pain  Level of Consciousness: awake, sedated and responds to stimulation  Airway & Oxygen Therapy: Patient Spontanous Breathing and Patient connected to face mask oxygen  Post-op Assessment: Report given to PACU RN, Post -op Vital signs reviewed and stable and Patient moving all extremities  Post vital signs: Reviewed and stable  Complications: No apparent anesthesia complications

## 2013-05-25 NOTE — Anesthesia Postprocedure Evaluation (Signed)
  Anesthesia Post-op Note  Patient: Jesse [name]  Procedure(s) Performed: Procedure(s): RIGHT INGUINAL HERNIA REPAIR  WITH MESH (Right) INSERTION OF MESH (Right)  Patient Location: PACU  Anesthesia Type:GA combined with regional for post-op pain  Level of Consciousness: awake and alert   Airway and Oxygen Therapy: Patient Spontanous Breathing  Post-op Pain: mild  Post-op Assessment: Post-op Vital signs reviewed, Patient's Cardiovascular Status Stable, Respiratory Function Stable, Patent Airway and No signs of Nausea or vomiting  Post-op Vital Signs: Reviewed and stable  Complications: No apparent anesthesia complications

## 2013-05-25 NOTE — Anesthesia Procedure Notes (Addendum)
Anesthesia Regional Block:  TAP block  Pre-Anesthetic Checklist: ,, timeout performed, Correct Patient, Correct Site, Correct Laterality, Correct Procedure, Correct Position, site marked, Risks and benefits discussed, pre-op evaluation, post-op pain management  Laterality: Right  Prep: Maximum Sterile Barrier Precautions used and chloraprep       Needles:  Injection technique: Single-shot  Needle Type: Echogenic Stimulator Needle          Additional Needles:  Procedures: ultrasound guided (picture in chart) TAP block Narrative:  Start time: 05/25/2013 6:55 AM End time: 05/25/2013 7:17 AM Injection made incrementally with aspirations every 5 mL. Anesthesiologist: Fitzgerald,MD  Additional Notes: 2% Lidocaine skin wheel.  TAP block Procedure Name: LMA Insertion Date/Time: 05/25/2013 7:44 AM Performed by: Meyer Russel Pre-anesthesia Checklist: Patient identified, Emergency Drugs available, Suction available and Patient being monitored Patient Re-evaluated:Patient Re-evaluated prior to inductionOxygen Delivery Method: Circle System Utilized Preoxygenation: Pre-oxygenation with 100% oxygen Intubation Type: IV induction Ventilation: Mask ventilation without difficulty LMA: LMA inserted LMA Size: 5.0 Number of attempts: 1 Airway Equipment and Method: bite block Placement Confirmation: positive ETCO2 and breath sounds checked- equal and bilateral Tube secured with: Tape Dental Injury: Teeth and Oropharynx as per pre-operative assessment

## 2013-05-25 NOTE — Op Note (Signed)
Hernia, Open, Procedure Note  Indications: The patient presented with a history of a right, reducible inguinal hernia.    Pre-operative Diagnosis: right reducible inguinal hernia Post-operative Diagnosis: same  Surgeon: Wynona Luna.   Assistants: none  Anesthesia: General LMA anesthesia and TAP block  ASA Class: 2  Procedure Details  The patient was seen again in the Holding Room. The risks, benefits, complications, treatment options, and expected outcomes were discussed with the patient. The possibilities of reaction to medication, pulmonary aspiration, perforation of viscus, bleeding, recurrent infection, the need for additional procedures, and development of a complication requiring transfusion or further operation were discussed with the patient and/or family. The likelihood of success in repairing the hernia and returning the patient to their previous functional status is good.  There was concurrence with the proposed plan, and informed consent was obtained. The site of surgery was properly noted/marked. The patient was taken to the Operating Room, identified as Lieutenant Diego, and the procedure verified as right inguinal hernia repair. A Time Out was held and the above information confirmed.  The patient was placed in the supine position and underwent induction of anesthesia. The lower abdomen and groin was prepped with Chloraprep and draped in the standard fashion, and 0.5% Marcaine with epinephrine was used to anesthetize the skin over the mid-portion of the inguinal canal. An oblique incision was made. Dissection was carried down through the subcutaneous tissue with cautery to the external oblique fascia.  We opened the external oblique fascia along the direction of its fibers to the external ring.  The spermatic cord was circumferentially dissected bluntly and retracted with a Penrose drain.  The floor of the inguinal canal was inspected and was intact.  We skeletonized the spermatic  cord and reduced a fairly large indirect hernia sac and cord lipoma.  We used right side Progrip mesh.  This was secured with 2-0 Prolene at the pubic tubercle. The mesh was deployed and tucked under the external oblique fascia.  The external oblique fascia was reapproximated with 2-0 Vicryl.  3-0 Vicryl was used to close the subcutaneous tissues and 4-0 Monocryl was used to close the skin in subcuticular fashion.  Benzoin and steri-strips were used to seal the incision.  A clean dressing was applied.  The patient was then extubated and brought to the recovery room in stable condition.  All sponge, instrument, and needle counts were correct prior to closure and at the conclusion of the case.   Estimated Blood Loss: Minimal                 Complications: None; patient tolerated the procedure well.         Disposition: PACU - hemodynamically stable.         Condition: stable  Wilmon Arms. Corliss Skains, MD, [facility]  General/ Trauma Surgery  05/25/2013 8:48 AM

## 2013-05-25 NOTE — Progress Notes (Signed)
Assisted Dr. Sampson Goon with right, ultrasound guided block. Side rails up, monitors on throughout procedure. See vital signs in flow sheet. Tolerated Procedure well.

## 2013-05-29 ENCOUNTER — Encounter (HOSPITAL_BASED_OUTPATIENT_CLINIC_OR_DEPARTMENT_OTHER): Payer: Self-pay | Admitting: Surgery

## 2013-06-08 ENCOUNTER — Encounter (INDEPENDENT_AMBULATORY_CARE_PROVIDER_SITE_OTHER): Payer: Self-pay | Admitting: Surgery

## 2013-06-08 ENCOUNTER — Ambulatory Visit (INDEPENDENT_AMBULATORY_CARE_PROVIDER_SITE_OTHER): Payer: BC Managed Care – PPO | Admitting: Surgery

## 2013-06-08 VITALS — BP 124/82 | HR 78 | Temp 98.1°F | Resp 18 | Ht 69.0 in | Wt 197.5 lb

## 2013-06-08 DIAGNOSIS — K4090 Unilateral inguinal hernia, without obstruction or gangrene, not specified as recurrent: Secondary | ICD-10-CM

## 2013-06-08 NOTE — Progress Notes (Signed)
Status post right inguinal hernia repair with mesh on 05/25/13. The patient had a large indirect hernia. He is doing quite well. Minimal swelling. His incision is healing well no sign of infection. No sign of recurrent hernia. Appetite bowel movements are normal. We discussed his level of activity. He should refrain from any heavy lifting or strenuous activity for at least 2 more weeks. Followup as needed.  Jesse [name]. Corliss Skains, MD, [facility]  General/ Trauma Surgery  06/08/2013 9:57 AM

## 2014-04-29 ENCOUNTER — Encounter: Payer: Self-pay | Admitting: *Deleted

## 2014-05-07 ENCOUNTER — Encounter: Payer: Self-pay | Admitting: *Deleted

## 2014-05-07 ENCOUNTER — Ambulatory Visit (INDEPENDENT_AMBULATORY_CARE_PROVIDER_SITE_OTHER): Payer: BC Managed Care – PPO | Admitting: Cardiology

## 2014-05-07 ENCOUNTER — Encounter: Payer: Self-pay | Admitting: Cardiology

## 2014-05-07 VITALS — BP 120/64 | HR 75 | Ht 69.0 in | Wt 199.0 lb

## 2014-05-07 DIAGNOSIS — R011 Cardiac murmur, unspecified: Secondary | ICD-10-CM

## 2014-05-07 DIAGNOSIS — I4510 Unspecified right bundle-branch block: Secondary | ICD-10-CM

## 2014-05-07 NOTE — Patient Instructions (Signed)
Your physician has requested that you have an echocardiogram. Echocardiography is a painless test that uses sound waves to create images of your heart. It provides your doctor with information about the size and shape of your heart and how well your heart's chambers and valves are working. This procedure takes approximately one hour. There are no restrictions for this procedure. July 15,2015 at [place] recommends that you schedule a follow-up appointment as needed with Dr Aundra Dubin.

## 2014-05-08 ENCOUNTER — Ambulatory Visit (HOSPITAL_COMMUNITY): Payer: BC Managed Care – PPO | Attending: Cardiovascular Disease | Admitting: Cardiology

## 2014-05-08 DIAGNOSIS — R011 Cardiac murmur, unspecified: Secondary | ICD-10-CM

## 2014-05-08 DIAGNOSIS — I379 Nonrheumatic pulmonary valve disorder, unspecified: Secondary | ICD-10-CM | POA: Insufficient documentation

## 2014-05-08 DIAGNOSIS — I4510 Unspecified right bundle-branch block: Secondary | ICD-10-CM | POA: Insufficient documentation

## 2014-05-08 DIAGNOSIS — I079 Rheumatic tricuspid valve disease, unspecified: Secondary | ICD-10-CM | POA: Insufficient documentation

## 2014-05-08 NOTE — Progress Notes (Signed)
Echo performed. 

## 2014-05-08 NOTE — Progress Notes (Signed)
Patient ID: Jesse [name], male   DOB: September 18, 1951, 63 y.o.   MRN: [id number] PCP: Dr. Orland Penman  63 yo with history of HTN and PE presents for evaluation of heart murmur. Patient had a heart murmur heard initially by his PCP at a recent physical.  He had not been told in the past that he has a murmur.  Symptomatically, he has been doing well.  No exertional chest pain or dyspnea.  Rare palpitations.  No lightheadedness or syncope.  He golfs regularly.  He had a PE in 2001 in the setting of knee surgery.  He took coumadin for 6 months.  He was also given a diagnosis of sarcoidosis in 1985, apparently after a chest X-ray.  He never had a tissue diagnosis and has had no pulmonary symptoms.    ECG: NSR, RBBB, LAFB  PMH: 1. Allergic rhinitis 2. HTN 3. GERD 4. ?Pulmonary sarcoidosis: Questionable diagnosis in 1985, never biopsied. 5. PE: In 2001, after arthroscopy.  He took coumadin x 6 months.  6. Umbilical hernia repair 7. Right inguinal hernia repair.  8. H/o peri-rectal abscess 9. Right TKR  SH: Nonsmoker, married, retired principal at [employer]  FH: Father with CAD, CABG at 64  ROS: All systems reviewed and negative except as per HPI.   Current Outpatient Prescriptions  Medication Sig Dispense Refill  . albuterol (PROVENTIL HFA;VENTOLIN HFA) 108 (90 BASE) MCG/ACT inhaler Inhale 2 puffs into the lungs every 6 (six) hours as needed for wheezing (TAKES IN [place]).       Marland Kitchen aspirin 325 MG EC tablet Take 325 mg by mouth daily.      . cetirizine (ZYRTEC) 10 MG tablet Take 10 mg by mouth daily.      . fluticasone (FLOVENT HFA) 110 MCG/ACT inhaler Inhale 1 puff into the lungs as needed (TAKES IN [place]).       . hydrochlorothiazide (HYDRODIURIL) 25 MG tablet Take 25 mg by mouth daily.      . Misc Natural Products (PROSTATE SUPPORT PO) Take by mouth daily.      . Multiple Vitamin (MULTIVITAMIN) tablet Take 1 tablet by mouth daily.       No current facility-administered medications for  this visit.    BP 120/64  Pulse 75  Ht 5\' 9"  (1.753 m)  Wt 199 lb (90.266 kg)  BMI 29.37 kg/m2 General: NAD Neck: No JVD, no thyromegaly or thyroid nodule.  Lungs: Clear to auscultation bilaterally with normal respiratory effort. CV: Nondisplaced PMI.  Heart regular S1/S2, widely split S2, no S3/S4, 1/6 early SEM RUSB.  No peripheral edema.  No carotid bruit.  Normal pedal pulses.  Abdomen: Soft, nontender, no hepatosplenomegaly, no distention.  Skin: Intact without lesions or rashes.  Neurologic: Alert and oriented x 3.  Psych: Normal affect. Extremities: No clubbing or cyanosis.  HEENT: Normal.   Assessment/Plan: 1. Murmur: Patient has an aortic-area systolic murmur.  It is heard in early systole and is not particularly loud.  No concerning symptoms.  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  I will have him get an echo.  2. RBBB: Patient had a prior echo with incomplete RBBB.  I suspect this is benign.  However, will assess for any cardiac abnormalities with echo.   Loralie Champagne 05/08/2014

## 2014-05-13 ENCOUNTER — Telehealth: Payer: Self-pay | Admitting: Cardiology

## 2014-05-13 NOTE — Telephone Encounter (Signed)
New message ° ° ° ° ° °Returning a nurses call to get echo results °

## 2014-05-13 NOTE — Telephone Encounter (Signed)
Spoke with patient about recent echo results 

## 2015-06-26 HISTORY — PX: COLONOSCOPY: SHX174

## 2016-02-25 ENCOUNTER — Encounter (HOSPITAL_COMMUNITY): Payer: Self-pay | Admitting: Emergency Medicine

## 2016-02-25 ENCOUNTER — Emergency Department (HOSPITAL_COMMUNITY): Payer: Medicare Other

## 2016-02-25 ENCOUNTER — Emergency Department (HOSPITAL_COMMUNITY)
Admission: EM | Admit: 2016-02-25 | Discharge: 2016-02-25 | Disposition: A | Discharge status: Home / Self Care | Payer: Medicare Other | Attending: Emergency Medicine | Admitting: Emergency Medicine

## 2016-02-25 DIAGNOSIS — F17200 Nicotine dependence, unspecified, uncomplicated: Secondary | ICD-10-CM | POA: Diagnosis not present

## 2016-02-25 DIAGNOSIS — Z7982 Long term (current) use of aspirin: Secondary | ICD-10-CM | POA: Diagnosis not present

## 2016-02-25 DIAGNOSIS — Z79899 Other long term (current) drug therapy: Secondary | ICD-10-CM | POA: Insufficient documentation

## 2016-02-25 DIAGNOSIS — R05 Cough: Secondary | ICD-10-CM

## 2016-02-25 DIAGNOSIS — Z86711 Personal history of pulmonary embolism: Secondary | ICD-10-CM | POA: Insufficient documentation

## 2016-02-25 DIAGNOSIS — Z7952 Long term (current) use of systemic steroids: Secondary | ICD-10-CM | POA: Diagnosis not present

## 2016-02-25 DIAGNOSIS — I10 Essential (primary) hypertension: Secondary | ICD-10-CM | POA: Diagnosis not present

## 2016-02-25 DIAGNOSIS — Z872 Personal history of diseases of the skin and subcutaneous tissue: Secondary | ICD-10-CM | POA: Insufficient documentation

## 2016-02-25 DIAGNOSIS — R062 Wheezing: Secondary | ICD-10-CM

## 2016-02-25 DIAGNOSIS — Z8719 Personal history of other diseases of the digestive system: Secondary | ICD-10-CM | POA: Diagnosis not present

## 2016-02-25 DIAGNOSIS — R059 Cough, unspecified: Secondary | ICD-10-CM

## 2016-02-25 DIAGNOSIS — T7840XA Allergy, unspecified, initial encounter: Secondary | ICD-10-CM

## 2016-02-25 DIAGNOSIS — J309 Allergic rhinitis, unspecified: Secondary | ICD-10-CM | POA: Insufficient documentation

## 2016-02-25 DIAGNOSIS — J9801 Acute bronchospasm: Secondary | ICD-10-CM

## 2016-02-25 DIAGNOSIS — M1990 Unspecified osteoarthritis, unspecified site: Secondary | ICD-10-CM | POA: Insufficient documentation

## 2016-02-25 MED ORDER — ALBUTEROL SULFATE (2.5 MG/3ML) 0.083% IN NEBU: 5.0000 mg | INHALATION_SOLUTION | Freq: Once | RESPIRATORY_TRACT | Status: AC

## 2016-02-25 MED ORDER — PREDNISONE 20 MG PO TABS
40.0000 mg | ORAL_TABLET | Freq: Once | ORAL | Status: AC
  Administered 2016-02-25: 40 mg via ORAL
  Filled 2016-02-25: qty 2

## 2016-02-25 MED ORDER — ALBUTEROL SULFATE (2.5 MG/3ML) 0.083% IN NEBU
5.0000 mg | INHALATION_SOLUTION | Freq: Once | RESPIRATORY_TRACT | Status: AC
  Administered 2016-02-25: 5 mg via RESPIRATORY_TRACT
  Filled 2016-02-25: qty 6

## 2016-02-25 MED ORDER — PREDNISONE 20 MG PO TABS: 40.0000 mg | ORAL_TABLET | Freq: Every day | ORAL | Status: DC

## 2016-02-25 MED ORDER — ALBUTEROL SULFATE (2.5 MG/3ML) 0.083% IN NEBU
INHALATION_SOLUTION | RESPIRATORY_TRACT | Status: AC
  Administered 2016-02-25: 5 mg via RESPIRATORY_TRACT
  Filled 2016-02-25: qty 6

## 2016-02-25 NOTE — ED Notes (Signed)
Pt. reports persistent dry cough , chest congestion , wheezing and SOB onset yesterday unrelieved by MDI , pt; stated his allergies are acting up - worked at yard yesterday .

## 2016-02-25 NOTE — ED Provider Notes (Signed)
CSN: [id number]     Arrival date & time 02/25/16  0404 History   First MD Initiated Contact with Patient 02/25/16 [phone]     Chief Complaint  Patient presents with  . Cough  . Wheezing  . Allergies      Patient is a 65 y.o. male presenting with cough and wheezing. The history is provided by the patient.  Cough Associated symptoms: shortness of breath and wheezing   Associated symptoms: no chest pain, no headaches and no rash   Wheezing Associated symptoms: cough and shortness of breath   Associated symptoms: no chest pain, no chest tightness, no headaches and no rash    patient presents with cough and wheezing. He's had for last few days. 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 Has a history of seasonal allergies states stomach area always gets worse. Also history of asthma. No relief with his albuterol. Poorly got rather short of breath last night. In waiting room had albuterol and feels somewhat better now. No chest pain. Slight congestion with his coughing episode and breathing treatment. No fevers or chills no abdominal pain. No swelling in his legs. He does occasionally smoke.  Past Medical History  Diagnosis Date  . HTN (hypertension)   . GERD (gastroesophageal reflux disease)   . Eczema   . Asthma   . Arthritis   . Pulmonary emboli ([place])     post op knee scope for a few months  . Wears glasses   . TA (temporal arteritis) [facility])    Past Surgical History  Procedure Laterality Date  . Nasal septum surgery  2005  . Knee surgery  2000    right  . Tonsillectomy  65 years old  . Umbilical hernia repair  2001  . Hernia repair    . Tracheal surgery  1985    emergency trach from throat infection  . Wisdom tooth extraction    . Eye surgery  age 6    left  . Joint replacement  10/2010    rt total knee  . Abcess drainage  5/14    i/d-rectal  . Inguinal hernia repair Right 05/25/2013    Procedure: RIGHT INGUINAL HERNIA REPAIR  WITH MESH;  Surgeon: Imogene Burn. Georgette Dover, MD;  Location:  [place];  Service: General;  Laterality: Right;  . Insertion of mesh Right 05/25/2013    Procedure: INSERTION OF MESH;  Surgeon: Imogene Burn. Georgette Dover, MD;  Location: [place];  Service: General;  Laterality: Right;   Family History  Problem Relation Age of Onset  . Prostate cancer Father   . Heart disease Father   . ALS Mother   . Lymphoma Son    Social History  Substance Use Topics  . Smoking status: Light Tobacco Smoker    Last Attempt to Quit: 01/24/2012  . Smokeless tobacco: None     Comment: smokes few cigs each year  . Alcohol Use: Yes     Comment: occasionally    Review of Systems  Constitutional: Negative for activity change and appetite change.  HENT: Negative for voice change.   Eyes: Negative for pain.  Respiratory: Positive for cough, shortness of breath and wheezing. Negative for chest tightness.   Cardiovascular: Negative for chest pain and leg swelling.  Gastrointestinal: Negative for nausea, vomiting, abdominal pain and diarrhea.  Genitourinary: Negative for flank pain.  Musculoskeletal: Negative for back pain and neck stiffness.  Skin: Negative for rash.  Neurological: Negative for weakness, numbness and headaches.  Psychiatric/Behavioral: Negative for behavioral problems.      Allergies  Adhesive and Oxycodone  Home Medications   Prior to Admission medications   Medication Sig Start Date End Date Taking? Authorizing Provider  albuterol (PROVENTIL HFA;VENTOLIN HFA) 108 (90 BASE) MCG/ACT inhaler Inhale 2 puffs into the lungs every 6 (six) hours as needed for wheezing (TAKES IN [place]).    Yes Historical Provider, MD  aspirin 325 MG EC tablet Take 325 mg by mouth daily.   Yes Historical Provider, MD  cetirizine (ZYRTEC) 10 MG tablet Take 10 mg by mouth daily.   Yes Historical Provider, MD  fluticasone (FLOVENT HFA) 110 MCG/ACT inhaler Inhale 1 puff into the lungs as needed (TAKES IN [place]).    Yes Historical Provider, MD   hydrochlorothiazide (HYDRODIURIL) 25 MG tablet Take 25 mg by mouth daily.   Yes Historical Provider, MD  predniSONE (DELTASONE) 20 MG tablet Take 2 tablets (40 mg total) by mouth daily. 02/26/16   Davonna Belling, MD   BP 150/113 mmHg  Pulse 76  Temp(Src) 97.8 F (36.6 C) (Oral)  Resp 16  Ht 5\' 10"  (1.778 m)  Wt 199 lb (90.266 kg)  BMI 28.55 kg/m2  SpO2 94% Physical Exam  Constitutional: He appears well-developed and well-nourished.  HENT:  Head: Normocephalic and atraumatic.  Neck: No JVD present.  Cardiovascular: Normal rate and regular rhythm.   Pulmonary/Chest: Effort normal. He has wheezes.  Mild wheezing without rales or respiratory distress.  Abdominal: Soft. Bowel sounds are normal. He exhibits no distension. There is no rebound.  Musculoskeletal: He exhibits no edema.  Neurological: He is alert.  Skin: Skin is warm and dry.  Psychiatric: He has a normal mood and affect.  Nursing note and vitals reviewed.   ED Course  Procedures (including critical care time) Labs Review Labs Reviewed - No data to display  Imaging Review Dg Chest 2 View  02/25/2016  CLINICAL DATA:  Cough and wheeze after working in the yard yesterday. EXAM: CHEST  2 VIEW COMPARISON:  CT chest 11/06/2010.  Chest 10/29/2010 FINDINGS: Normal heart size and pulmonary vascularity. Multiple small pulmonary nodules demonstrated mostly in the upper lungs. Appearance is similar to previous study and previous chest CT. No focal airspace disease or consolidation. No blunting of costophrenic angles. No pneumothorax. Surgical clips at the thoracic inlet. IMPRESSION: Multiple pulmonary nodules, similar to previous study. Electronically Signed   By: Lucienne Capers M.D.   On: 02/25/2016 05:33   I have personally reviewed and evaluated these images and lab results as part of my medical decision-making.   EKG Interpretation None      MDM   Final diagnoses:  Allergy, initial encounter  Bronchospasm     Patient was shortness of breath and cough. History of asthma. Is wheezing now. X-ray reassuring. Likely allergic with combination of his asthma. Not hypoxic here. Feels better after breathing treatments. Will discharge home with a short course of steroids.    Davonna Belling, MD 02/25/16 [phone]

## 2016-02-25 NOTE — Discharge Instructions (Signed)
Allergies An allergy is an abnormal reaction to a substance by the body's defense system (immune system). Allergies can develop at any age. WHAT CAUSES ALLERGIES? An allergic reaction happens when the immune system mistakenly reacts to a normally harmless substance, called an allergen, as if it were harmful. The immune system releases antibodies to fight the substance. Antibodies eventually release a chemical called histamine into the bloodstream. The release of histamine is meant to protect the body from infection, but it also causes discomfort. An allergic reaction can be triggered by:  Eating an allergen.  Inhaling an allergen.  Touching an allergen. WHAT TYPES OF ALLERGIES ARE THERE? There are many types of allergies. Common types include:  Seasonal allergies. People with this type of allergy are usually allergic to substances that are only present during certain seasons, such as molds and pollens.  Food allergies.  Drug allergies.  Insect allergies.  Animal dander allergies. WHAT ARE SYMPTOMS OF ALLERGIES? Possible allergy symptoms include:  Swelling of the lips, face, tongue, mouth, or throat.  Sneezing, coughing, or wheezing.  Nasal congestion.  Tingling in the mouth.  Rash.  Itching.  Itchy, red, swollen areas of skin (hives).  Watery eyes.  Vomiting.  Diarrhea.  Dizziness.  Lightheadedness.  Fainting.  Trouble breathing or swallowing.  Chest tightness.  Rapid heartbeat. HOW ARE ALLERGIES DIAGNOSED? Allergies are diagnosed with a medical and family history and one or more of the following:  Skin tests.  Blood tests.  A food diary. A food diary is a record of all the foods and drinks you have in a day and of all the symptoms you experience.  The results of an elimination diet. An elimination diet involves eliminating foods from your diet and then adding them back in one by one to find out if a certain food causes an allergic reaction. HOW ARE  ALLERGIES TREATED? There is no cure for allergies, but allergic reactions can be treated with medicine. Severe reactions usually need to be treated at a hospital. HOW CAN REACTIONS BE PREVENTED? The best way to prevent an allergic reaction is by avoiding the substance you are allergic to. Allergy shots and medicines can also help prevent reactions in some cases. People with severe allergic reactions may be able to prevent a life-threatening reaction called anaphylaxis with a medicine given right after exposure to the allergen.   This information is not intended to replace advice given to you by your health care provider. Make sure you discuss any questions you have with your health care provider.   Document Released: 01/04/2003 Document Revised: 11/01/2014 Document Reviewed: 07/23/2014 Elsevier Interactive Patient Education 2016 [employer].  Asthma, Acute Bronchospasm Acute bronchospasm caused by asthma is also referred to as an asthma attack. Bronchospasm means your air passages become narrowed. The narrowing is caused by inflammation and tightening of the muscles in the air tubes (bronchi) in your lungs. This can make it hard to breathe or cause you to wheeze and cough. CAUSES Possible triggers are:  Animal dander from the skin, hair, or feathers of animals.  Dust mites contained in house dust.  Cockroaches.  Pollen from trees or grass.  Mold.  Cigarette or tobacco smoke.  Air pollutants such as dust, household cleaners, hair sprays, aerosol sprays, paint fumes, strong chemicals, or strong odors.  Cold air or weather changes. Cold air may trigger inflammation. Winds increase molds and pollens in the air.  Strong emotions such as crying or laughing hard.  Stress.  Certain medicines such  as aspirin or beta-blockers.  Sulfites in foods and drinks, such as dried fruits and wine.  Infections or inflammatory conditions, such as a flu, cold, or inflammation of the nasal membranes  (rhinitis).  Gastroesophageal reflux disease (GERD). GERD is a condition where stomach acid backs up into your esophagus.  Exercise or strenuous activity. SIGNS AND SYMPTOMS   Wheezing.  Excessive coughing, particularly at night.  Chest tightness.  Shortness of breath. DIAGNOSIS  Your health care provider will ask you about your medical history and perform a physical exam. A chest X-ray or blood testing may be performed to look for other causes of your symptoms or other conditions that may have triggered your asthma attack. TREATMENT  Treatment is aimed at reducing inflammation and opening up the airways in your lungs. Most asthma attacks are treated with inhaled medicines. These include quick relief or rescue medicines (such as bronchodilators) and controller medicines (such as inhaled corticosteroids). These medicines are sometimes given through an inhaler or a nebulizer. Systemic steroid medicine taken by mouth or given through an IV tube also can be used to reduce the inflammation when an attack is moderate or severe. Antibiotic medicines are only used if a bacterial infection is present.  HOME CARE INSTRUCTIONS   Rest.  Drink plenty of liquids. This helps the mucus to remain thin and be easily coughed up. Only use caffeine in moderation and do not use alcohol until you have recovered from your illness.  Do not smoke. Avoid being exposed to secondhand smoke.  You play a critical role in keeping yourself in good health. Avoid exposure to things that cause you to wheeze or to have breathing problems.  Keep your medicines up-to-date and available. Carefully follow your health care provider's treatment plan.  Take your medicine exactly as prescribed.  When pollen or pollution is bad, keep windows closed and use an air conditioner or go to places with air conditioning.  Asthma requires careful medical care. See your health care provider for a follow-up as advised. If you are more  than [redacted] weeks pregnant and you were prescribed any new medicines, let your obstetrician know about the visit and how you are doing. Follow up with your health care provider as directed.  After you have recovered from your asthma attack, make an appointment with your outpatient doctor to talk about ways to reduce the likelihood of future attacks. If you do not have a doctor who manages your asthma, make an appointment with a primary care doctor to discuss your asthma. SEEK IMMEDIATE MEDICAL CARE IF:   You are getting worse.  You have trouble breathing. If severe, call your local emergency services (911 in the [place]  You develop chest pain or discomfort.  You are vomiting.  You are not able to keep fluids down.  You are coughing up yellow, green, brown, or bloody sputum.  You have a fever and your symptoms suddenly get worse.  You have trouble swallowing. MAKE SURE YOU:   Understand these instructions.  Will watch your condition.  Will get help right away if you are not doing well or get worse.   This information is not intended to replace advice given to you by your health care provider. Make sure you discuss any questions you have with your health care provider.   Document Released: 01/26/2007 Document Revised: 10/16/2013 Document Reviewed: 04/18/2013 Elsevier Interactive Patient Education [employer].

## 2018-09-04 ENCOUNTER — Encounter: Payer: Self-pay | Admitting: Podiatry

## 2018-09-04 ENCOUNTER — Ambulatory Visit: Payer: Medicare Other | Admitting: Podiatry

## 2018-09-04 ENCOUNTER — Ambulatory Visit (INDEPENDENT_AMBULATORY_CARE_PROVIDER_SITE_OTHER): Payer: Medicare Other

## 2018-09-04 VITALS — BP 137/90 | HR 75 | Resp 18

## 2018-09-04 DIAGNOSIS — R200 Anesthesia of skin: Secondary | ICD-10-CM

## 2018-09-04 DIAGNOSIS — M7741 Metatarsalgia, right foot: Secondary | ICD-10-CM | POA: Diagnosis not present

## 2018-09-04 DIAGNOSIS — M7742 Metatarsalgia, left foot: Secondary | ICD-10-CM

## 2018-09-04 DIAGNOSIS — M79671 Pain in right foot: Secondary | ICD-10-CM

## 2018-09-04 DIAGNOSIS — B351 Tinea unguium: Secondary | ICD-10-CM

## 2018-09-05 ENCOUNTER — Ambulatory Visit: Payer: Self-pay

## 2018-09-05 DIAGNOSIS — B351 Tinea unguium: Secondary | ICD-10-CM

## 2018-09-05 NOTE — Progress Notes (Signed)
Pt presents with mycotic infection of nails 1-5 bilateral.  All other systems are negative  Laser therapy administered to affected nails and tolerated well. All safety precautions were in place.  2nd treatment.  Follow up in 4 weeks     

## 2018-09-06 NOTE — Progress Notes (Signed)
Subjective:   Patient ID: Jesse [name], male   DOB: 67 y.o.   MRN: [id number]   HPI 67 year old male presents the office today for concerns of a slight numbness type feeling to the ball of his right foot.  He says it goes across the ball of his foot he feels that he is walking on a pad.  He states that this is mild he is starting to get on the left foot as well but not as significant.  Denies any significant pain.  Is been ongoing about 6 to 7 months.  Denies any numbness or tingling or any burning or sharp pains.  The pain does not wake him up at night.  He is also concerned as his toenails becoming thickened discolored mostly to his big toenails.  Denies any pain in the nails and denies any redness or drainage or any swelling.  He has no other concerns.   Review of Systems  All other systems reviewed and are negative.  Past Medical History:  Diagnosis Date  . Arthritis   . Asthma   . Eczema   . GERD (gastroesophageal reflux disease)   . HTN (hypertension)   . Pulmonary emboli ([place])    post op knee scope for a few months  . TA (temporal arteritis) ([place])   . Wears glasses     Past Surgical History:  Procedure Laterality Date  . ABCESS DRAINAGE  5/14   i/d-rectal  . EYE SURGERY  age 55   left  . HERNIA REPAIR    . INGUINAL HERNIA REPAIR Right 05/25/2013   Procedure: RIGHT INGUINAL HERNIA REPAIR  WITH MESH;  Surgeon: Imogene Burn. Georgette Dover, MD;  Location: [place];  Service: General;  Laterality: Right;  . INSERTION OF MESH Right 05/25/2013   Procedure: INSERTION OF MESH;  Surgeon: Imogene Burn. Georgette Dover, MD;  Location: [place];  Service: General;  Laterality: Right;  . JOINT REPLACEMENT  10/2010   rt total knee  . KNEE SURGERY  2000   right  . NASAL SEPTUM SURGERY  2005  . TONSILLECTOMY  67 years old  . [place]   emergency trach from throat infection  . UMBILICAL HERNIA REPAIR  2001  . WISDOM TOOTH EXTRACTION       Current Outpatient  Medications:  .  NON FORMULARY, [facility]  Onychomycosis Nail Lacquer -  Fluconazole 2%, Terbinafine 1% DMSO/UNDECYLENIC ACID 25% Apply to affected nail once daily Qty. 120 gm 3 refills, Disp: , Rfl:  .  albuterol (PROVENTIL HFA;VENTOLIN HFA) 108 (90 BASE) MCG/ACT inhaler, Inhale 2 puffs into the lungs every 6 (six) hours as needed for wheezing (TAKES IN [place]). , Disp: , Rfl:  .  amoxicillin (AMOXIL) 500 MG tablet, TAKE 4 TABLETS BY MOUTH 1 HOUR PRIOR TO PROCEDURE, Disp: , Rfl: 1 .  aspirin 325 MG EC tablet, Take 325 mg by mouth daily., Disp: , Rfl:  .  cetirizine (ZYRTEC) 10 MG tablet, Take 10 mg by mouth daily., Disp: , Rfl:  .  fluticasone (FLOVENT HFA) 110 MCG/ACT inhaler, Inhale 1 puff into the lungs as needed (TAKES IN [place]). , Disp: , Rfl:  .  hydrochlorothiazide (HYDRODIURIL) 25 MG tablet, Take 25 mg by mouth daily., Disp: , Rfl:  .  predniSONE (DELTASONE) 20 MG tablet, Take 2 tablets (40 mg total) by mouth daily., Disp: 4 tablet, Rfl: 0  Allergies  Allergen Reactions  . Adhesive [Tape] Other (See Comments)  Skin bumps use paper tape  . Oxycodone Itching          Objective:  Physical Exam  General: AAO x3, NAD  Dermatological: Bilateral hallux nails are mildly hypertrophic, dystrophic with yellow to brown discoloration.  No pain in the nails there is no surrounding redness or drainage or any signs of infection.  No open lesions.  Vascular: Dorsalis Pedis artery and Posterior Tibial artery pedal pulses are 2/4 bilateral with immedate capillary fill time. There is no pain with calf compression, swelling, warmth, erythema.   Neruologic: Grossly intact via light touch bilateral. Protective threshold with Semmes Wienstein monofilament intact to all pedal sites bilateral.   Musculoskeletal: Mild prominence of metatarsal heads plantarly with atrophy of the fat pad.  No area pinpoint bony tenderness or pain to vibratory sensation.  No palpable neuroma identified.  There  is mild fluid submetatarsal 2 area there is no erythema or increase in warmth.  This appears to be almost like a bursitis.  Muscular strength 5/5 in all groups tested bilateral.  Gait: Unassisted, Nonantalgic.      Assessment:   Metatarsalgia right side worse than left, onychomycosis     Plan:  -Treatment options discussed including all alternatives, risks, and complications -Etiology of symptoms were discussed -X-rays were obtained and reviewed with the patient.  No evidence of acute fracture or stress fracture.  Discussed metatarsal position. -Dispensed metatarsal offloading pads.  Discussed shoe modifications and we can consider inserts if needed. -Ordered a compound cream for onychomycosis.  Discussed other treatment options as well please insert the topical.  Also he wants to do laser.  I will have him follow-up with Janett Billow for lasering of his toenails.  No promises or guarantees were given discussed success rates.    Trula Slade DPM

## 2018-10-03 ENCOUNTER — Ambulatory Visit: Payer: Self-pay

## 2018-10-03 DIAGNOSIS — B351 Tinea unguium: Secondary | ICD-10-CM

## 2018-10-05 NOTE — Progress Notes (Signed)
Pt presents with mycotic infection of nails 1-5 bilateral.  All other systems are negative  Laser therapy administered to affected nails and tolerated well. All safety precautions were in place.  3rd treatment.  Follow up in 4 weeks     

## 2018-10-30 ENCOUNTER — Ambulatory Visit: Payer: Self-pay

## 2018-10-30 DIAGNOSIS — B351 Tinea unguium: Secondary | ICD-10-CM

## 2018-11-07 NOTE — Progress Notes (Signed)
Pt presents with mycotic infection of nails 1-5 bilateral  All other systems are negative  Laser therapy administered to affected nails and tolerated well. All safety precautions were in place. 4th treatment.  Follow up in 4 weeks     

## 2018-11-27 ENCOUNTER — Ambulatory Visit (INDEPENDENT_AMBULATORY_CARE_PROVIDER_SITE_OTHER): Payer: Medicare Other

## 2018-11-27 DIAGNOSIS — B351 Tinea unguium: Secondary | ICD-10-CM

## 2018-11-28 NOTE — Progress Notes (Signed)
Pt presents with mycotic infection of nails 1-5 bilateral All other systems are negative  Laser therapy administered to affected nails and tolerated well. All safety precautions were in place. 5th treatment.  Follow up in 4 weeks     

## 2018-12-25 ENCOUNTER — Other Ambulatory Visit: Payer: Medicare Other

## 2018-12-26 ENCOUNTER — Ambulatory Visit: Payer: Self-pay

## 2018-12-26 DIAGNOSIS — B351 Tinea unguium: Secondary | ICD-10-CM

## 2018-12-26 DIAGNOSIS — M7741 Metatarsalgia, right foot: Secondary | ICD-10-CM

## 2018-12-26 DIAGNOSIS — M7742 Metatarsalgia, left foot: Principal | ICD-10-CM

## 2018-12-26 NOTE — Progress Notes (Signed)
Pt presents with mycotic infection of nails 1-5 bilateral  All other systems are negative  Laser therapy administered to affected nails and tolerated well. All safety precautions were in place.  6th treatment.  Follow up in 4 weeks     

## 2019-01-26 ENCOUNTER — Other Ambulatory Visit: Payer: Medicare Other

## 2019-01-30 ENCOUNTER — Other Ambulatory Visit: Payer: Medicare Other

## 2019-02-12 ENCOUNTER — Other Ambulatory Visit: Payer: Medicare Other

## 2019-02-16 ENCOUNTER — Other Ambulatory Visit: Payer: Self-pay

## 2019-02-16 ENCOUNTER — Ambulatory Visit: Payer: Self-pay

## 2019-02-16 DIAGNOSIS — B351 Tinea unguium: Secondary | ICD-10-CM

## 2019-02-16 DIAGNOSIS — M7741 Metatarsalgia, right foot: Secondary | ICD-10-CM

## 2019-02-16 DIAGNOSIS — M7742 Metatarsalgia, left foot: Principal | ICD-10-CM

## 2019-02-16 NOTE — Progress Notes (Signed)
Pt presents with mycotic infection of nails 1-5 bilateral  All other systems are negative  Laser therapy administered to affected nails and tolerated well. All safety precautions were in place. Follow up with Dr Jacqualyn Posey in 2 months

## 2019-04-19 ENCOUNTER — Ambulatory Visit: Payer: Medicare Other | Admitting: Podiatry

## 2019-04-19 ENCOUNTER — Encounter: Payer: Self-pay | Admitting: Podiatry

## 2019-04-19 ENCOUNTER — Other Ambulatory Visit: Payer: Self-pay

## 2019-04-19 VITALS — Temp 97.8°F

## 2019-04-19 DIAGNOSIS — M7742 Metatarsalgia, left foot: Secondary | ICD-10-CM

## 2019-04-19 DIAGNOSIS — M7741 Metatarsalgia, right foot: Secondary | ICD-10-CM | POA: Diagnosis not present

## 2019-04-19 DIAGNOSIS — B351 Tinea unguium: Secondary | ICD-10-CM | POA: Diagnosis not present

## 2019-04-23 NOTE — Progress Notes (Signed)
Subjective: 68 year old male presents the office today for follow-up evaluation of nail fungus.  He states that overall the nails have improved.  There is much better with a wear but not completely resolved.  He has no pain in the nails no redness or drainage or any swelling.  He still gets some discomfort in the balls of his feet.  Some occasional numbness but no radiating pain.  Pain does not wake him up at night. Denies any systemic complaints such as fevers, chills, nausea, vomiting. No acute changes since last appointment, and no other complaints at this time.   Objective: AAO x3, NAD DP/PT pulses palpable bilaterally, CRT less than 3 seconds Overall nails are improved there still some discoloration mostly toward the distal aspect.  There is no pain in the nails there is no redness or drainage or any swelling. Subjectively there is discomfort submetatarsal area bilaterally there is no specific area tenderness identified today.  There is no neuroma palpable.  Prominence the metatarsal heads plantarly with atrophy of fat pad. No open lesions or pre-ulcerative lesions.  No pain with calf compression, swelling, warmth, erythema  Assessment: Onychomycosis, metatarsalgia  Plan: -All treatment options discussed with the patient including all alternatives, risks, complications.  -Dispensed formula 3.  I would continue with treating the nail fungus. -Dispensed gel metatarsal offloading pads per discussion modifications as well.take pressure off the metatarsal area.  If there is any worsening numbness or any radiating pain or any progression to let me know.  We will monitor. -Patient encouraged to call the office with any questions, concerns, change in symptoms.   Trula Slade DPM

## 2019-09-24 ENCOUNTER — Ambulatory Visit
Admission: RE | Admit: 2019-09-24 | Discharge: 2019-09-24 | Disposition: A | Discharge status: Home / Self Care | Payer: Medicare Other | Source: Ambulatory Visit | Attending: Family Medicine | Admitting: Family Medicine

## 2019-09-24 ENCOUNTER — Other Ambulatory Visit: Payer: Self-pay | Admitting: Family Medicine

## 2019-09-24 DIAGNOSIS — R109 Unspecified abdominal pain: Secondary | ICD-10-CM

## 2019-11-18 ENCOUNTER — Ambulatory Visit: Payer: Medicare PPO | Attending: Internal Medicine

## 2019-11-18 DIAGNOSIS — Z23 Encounter for immunization: Secondary | ICD-10-CM

## 2019-11-19 NOTE — Progress Notes (Signed)
   Covid-19 Vaccination Clinic  Name:  [name]    MRN: [id number] DOB: 09-04-1951  11/18/2019  Mr. Linehan was observed post Covid-19 immunization for 15 minutes without incidence. He was provided with Vaccine Information Sheet and instruction to access the V-Safe system.   Mr. Rough was instructed to call 911 with any severe reactions post vaccine: Marland Kitchen Difficulty breathing  . Swelling of your face and throat  . A fast heartbeat  . A bad rash all over your body  . Dizziness and weakness    Immunizations Administered    Name Date Dose VIS Date Route   Moderna COVID-19 Vaccine 11/18/2019  3:55 PM 0.5 mL 09/25/2019 Intramuscular   Manufacturer: Levan Hurst   Lot: [id number]   LydiaPO:[id number]      Documented on behalf of: K. Quentin Cornwall

## 2019-12-16 ENCOUNTER — Ambulatory Visit: Payer: Medicare PPO | Attending: Internal Medicine

## 2019-12-16 DIAGNOSIS — Z23 Encounter for immunization: Secondary | ICD-10-CM | POA: Insufficient documentation

## 2019-12-16 NOTE — Progress Notes (Signed)
   Covid-19 Vaccination Clinic  Name:  Jesse [name]    MRN: [id number] DOB: 1951/03/18  12/16/2019  Jesse [name] was observed post Covid-19 immunization for 15 minutes without incidence. He was provided with Vaccine Information Sheet and instruction to access the V-Safe system.   Jesse [name] was instructed to call 911 with any severe reactions post vaccine: Marland Kitchen Difficulty breathing  . Swelling of your face and throat  . A fast heartbeat  . A bad rash all over your body  . Dizziness and weakness    Immunizations Administered    Name Date Dose VIS Date Route   Moderna COVID-19 Vaccine 12/16/2019 12:25 PM 0.5 mL 09/25/2019 Intramuscular   Manufacturer: Moderna   Lot: [id number]   [place]uburnVO:[id number]

## 2020-02-25 ENCOUNTER — Encounter (HOSPITAL_COMMUNITY): Payer: Self-pay

## 2020-02-25 NOTE — Patient Instructions (Signed)
DUE TO COVID-19 ONLY ONE VISITOR IS ALLOWED TO COME WITH YOU AND STAY IN THE WAITING ROOM ONLY DURING PRE OP AND PROCEDURE DAY OF SURGERY. THE 2 VISITORS MAY VISIT WITH YOU AFTER SURGERY IN YOUR PRIVATE ROOM DURING VISITING HOURS ONLY!  YOU NEED TO HAVE A COVID 19 TEST ON_5/6______ @__11 :00_____, THIS TEST MUST BE DONE BEFORE SURGERY, COME  [address], [place] , [address].  ([place]) ONCE YOUR COVID TEST IS COMPLETED, PLEASE BEGIN THE QUARANTINE INSTRUCTIONS AS OUTLINED IN YOUR HANDOUT.                Cordella Register   Your procedure is scheduled on: 03/03/20   Report to [facility] Main  Entrance   Report to Short Stay at 5:55 AM     Call this number if you have problems the morning of surgery [phone]    . BRUSH YOUR TEETH MORNING OF SURGERY AND RINSE YOUR MOUTH OUT, NO CHEWING GUM CANDY OR MINTS.   Do not eat food After Midnight.   YOU MAY HAVE CLEAR LIQUIDS FROM MIDNIGHT UNTIL 4:30 AM.   At 4:30 AM Please finish the prescribed Pre-Surgery  drink.   Nothing by mouth after you finish the  drink !   Take these medicines the morning of surgery with A SIP OF WATER: Zyrtec, Use your eydrops and use your inhalers and bring them to the hospital with you                                 You may not have any metal on your body including              piercings  Do not wear jewelry, lotions, powders or  deodorant                     Men may shave face and neck.   Do not bring valuables to the hospital. [place].  Contacts, dentures or bridgework may not be worn into surgery.      Patients discharged the day of surgery will not be allowed to drive home.   IF YOU ARE HAVING SURGERY AND GOING HOME THE SAME DAY, YOU MUST HAVE AN ADULT TO DRIVE YOU HOME AND BE WITH YOU FOR 24 HOURS. YOU MAY GO HOME BY TAXI OR UBER OR ORTHERWISE, BUT AN ADULT MUST ACCOMPANY YOU HOME AND STAY WITH YOU FOR 24  HOURS.  Name and phone number of your driver:  Special Instructions: N/A              Please read over the following fact sheets you were given: _____________________________________________________________________             [facility] - Preparing for Surgery Before surgery, you can play an important role.   Because skin is not sterile, your skin needs to be as free of germs as possible .  You can reduce the number of germs on your skin by washing with CHG (chlorahexidine gluconate) soap before surgery.   CHG is an antiseptic cleaner which kills germs and bonds with the skin to continue killing germs even after washing. Please DO NOT use if you have an allergy to CHG or antibacterial soaps.   If your skin becomes reddened/irritated stop using the  CHG and inform your nurse when you arrive at Short Stay. You may shave your face/neck.  Please follow these instructions carefully:  1.  Shower with CHG Soap the night before surgery and the  morning of Surgery.  2.  If you choose to wash your hair, wash your hair first as usual with your  normal  shampoo.  3.  After you shampoo, rinse your hair and body thoroughly to remove the  shampoo.                                        4.  Use CHG as you would any other liquid soap.  You can apply chg directly  to the skin and wash                       Gently with a scrungie or clean washcloth.  5.  Apply the CHG Soap to your body ONLY FROM THE NECK DOWN.   Do not use on face/ open                           Wound or open sores. Avoid contact with eyes, ears mouth and genitals (private parts).                       Wash face,  Genitals (private parts) with your normal soap.             6.  Wash thoroughly, paying special attention to the area where your surgery  will be performed.  7.  Thoroughly rinse your body with warm water from the neck down.  8.  DO NOT shower/wash with your normal soap after using and rinsing off  the CHG Soap.             9.   Pat yourself dry with a clean towel.            10.  Wear clean pajamas.            11.  Place clean sheets on your bed the night of your first shower and do not  sleep with pets. Day of Surgery : Do not apply any lotions/deodorants the morning of surgery.  Please wear clean clothes to the hospital/surgery center.  FAILURE TO FOLLOW THESE INSTRUCTIONS MAY RESULT IN THE CANCELLATION OF YOUR SURGERY PATIENT SIGNATURE_________________________________  NURSE SIGNATURE__________________________________  ________________________________________________________________________   Adam Phenix  An incentive spirometer is a tool that can help keep your lungs clear and active. This tool measures how well you are filling your lungs with each breath. Taking long deep breaths may help reverse or decrease the chance of developing breathing (pulmonary) problems (especially infection) following:  A long period of time when you are unable to move or be active. BEFORE THE PROCEDURE   If the spirometer includes an indicator to show your best effort, your nurse or respiratory therapist will set it to a desired goal.  If possible, sit up straight or lean slightly forward. Try not to slouch.  Hold the incentive spirometer in an upright position. INSTRUCTIONS FOR USE  1. Sit on the edge of your bed if possible, or sit up as far as you can in bed or on a chair. 2. Hold the incentive spirometer in an upright position. 3. Breathe out normally. 4. Place  the mouthpiece in your mouth and seal your lips tightly around it. 5. Breathe in slowly and as deeply as possible, raising the piston or the ball toward the top of the column. 6. Hold your breath for 3-5 seconds or for as long as possible. Allow the piston or ball to fall to the bottom of the column. 7. Remove the mouthpiece from your mouth and breathe out normally. 8. Rest for a few seconds and repeat Steps 1 through 7 at least 10 times every 1-2  hours when you are awake. Take your time and take a few normal breaths between deep breaths. 9. The spirometer may include an indicator to show your best effort. Use the indicator as a goal to work toward during each repetition. 10. After each set of 10 deep breaths, practice coughing to be sure your lungs are clear. If you have an incision (the cut made at the time of surgery), support your incision when coughing by placing a pillow or rolled up towels firmly against it. Once you are able to get out of bed, walk around indoors and cough well. You may stop using the incentive spirometer when instructed by your caregiver.  RISKS AND COMPLICATIONS  Take your time so you do not get dizzy or light-headed.  If you are in pain, you may need to take or ask for pain medication before doing incentive spirometry. It is harder to take a deep breath if you are having pain. AFTER USE  Rest and breathe slowly and easily.  It can be helpful to keep track of a log of your progress. Your caregiver can provide you with a simple table to help with this. If you are using the spirometer at home, follow these instructions: [place] IF:   You are having difficultly using the spirometer.  You have trouble using the spirometer as often as instructed.  Your pain medication is not giving enough relief while using the spirometer.  You develop fever of 100.5 F (38.1 C) or higher. SEEK IMMEDIATE MEDICAL CARE IF:   You cough up bloody sputum that had not been present before.  You develop fever of 102 F (38.9 C) or greater.  You develop worsening pain at or near the incision site. MAKE SURE YOU:   Understand these instructions.  Will watch your condition.  Will get help right away if you are not doing well or get worse. Document Released: 02/21/2007 Document Revised: 01/03/2012 Document Reviewed: 04/24/2007 [facility] Patient Information 2014 [place],  [place].   ________________________________________________________________________

## 2020-02-26 ENCOUNTER — Encounter (HOSPITAL_COMMUNITY)
Admission: RE | Admit: 2020-02-26 | Discharge: 2020-02-26 | Disposition: A | Discharge status: Home / Self Care | Payer: Medicare PPO | Source: Ambulatory Visit | Attending: Orthopedic Surgery | Admitting: Orthopedic Surgery

## 2020-02-26 ENCOUNTER — Other Ambulatory Visit: Payer: Self-pay

## 2020-02-26 ENCOUNTER — Encounter (HOSPITAL_COMMUNITY): Payer: Self-pay

## 2020-02-26 DIAGNOSIS — Z01818 Encounter for other preprocedural examination: Secondary | ICD-10-CM | POA: Insufficient documentation

## 2020-02-26 DIAGNOSIS — I10 Essential (primary) hypertension: Secondary | ICD-10-CM | POA: Insufficient documentation

## 2020-02-26 LAB — COMPREHENSIVE METABOLIC PANEL
ALT: 22 U/L (ref 0–44)
AST: 22 U/L (ref 15–41)
Albumin: 4.1 g/dL (ref 3.5–5.0)
Alkaline Phosphatase: 67 U/L (ref 38–126)
Anion gap: 5 (ref 5–15)
BUN: 16 mg/dL (ref 8–23)
CO2: 29 mmol/L (ref 22–32)
Calcium: 9.3 mg/dL (ref 8.9–10.3)
Chloride: 102 mmol/L (ref 98–111)
Creatinine, Ser: 1.22 mg/dL (ref 0.61–1.24)
GFR calc Af Amer: >60 mL/min (ref >60)
GFR calc non Af Amer: >60 mL/min (ref >60)
Glucose, Bld: 97 mg/dL (ref 70–99)
Potassium: 3.6 mmol/L (ref 3.5–5.1)
Sodium: 136 mmol/L (ref 135–145)
Total Bilirubin: 1.3 mg/dL — ABNORMAL HIGH (ref 0.3–1.2)
Total Protein: 7.5 g/dL (ref 6.5–8.1)

## 2020-02-26 LAB — CBC
HCT: 43.3 % (ref 39.0–52.0)
Hemoglobin: 14.6 g/dL (ref 13.0–17.0)
MCH: 31.3 pg (ref 26.0–34.0)
MCHC: 33.7 g/dL (ref 30.0–36.0)
MCV: 92.7 fL (ref 80.0–100.0)
Platelets: 163 K/uL (ref 150–400)
RBC: 4.67 MIL/uL (ref 4.22–5.81)
RDW: 13.2 % (ref 11.5–15.5)
WBC: 4.1 K/uL (ref 4.0–10.5)
nRBC: 0 % (ref 0.0–0.2)

## 2020-02-26 LAB — APTT: aPTT: 27 seconds (ref 24–36)

## 2020-02-26 LAB — PROTIME-INR
INR: 0.9 (ref 0.8–1.2)
Prothrombin Time: 12.2 seconds (ref 11.4–15.2)

## 2020-02-26 LAB — SURGICAL PCR SCREEN
MRSA, PCR: NEGATIVE
Staphylococcus aureus: POSITIVE — AB

## 2020-02-26 NOTE — H&P (Signed)
TOTAL KNEE ADMISSION H&P  Patient is being admitted for left total knee arthroplasty.  Subjective:  Chief Complaint:left knee pain.  HPI: Jesse [name], 69 y.o. male, has a history of pain and functional disability in the left knee due to arthritis and has failed non-surgical conservative treatments for greater than 12 weeks to includecorticosteriod injections, viscosupplementation injections and activity modification.  Onset of symptoms was gradual, starting 4 years ago with gradually worsening course since that time. The patient noted prior procedures on the knee to include  menisectomy on the left knee(s).  Patient currently rates pain in the left knee(s) at 7 out of 10 with activity. Patient has worsening of pain with activity and weight bearing and pain that interferes with activities of daily living.  Patient has evidence of joint space narrowing by imaging studies. There is no active infection.  Patient Active Problem List   Diagnosis Date Noted  . Murmur, cardiac 05/08/2014  . RBBB 05/08/2014  . Right inguinal hernia 03/06/2013  . Abscess of right buttock 11/23/2012   Past Medical History:  Diagnosis Date  . Arthritis    knees  . Asthma    seasonal  . Eczema   . GERD (gastroesophageal reflux disease)   . HTN (hypertension)   . Pulmonary emboli ([place])    post op knee scope for a few months  . TA (temporal arteritis) ([employer])   . Wears glasses     Past Surgical History:  Procedure Laterality Date  . ABCESS DRAINAGE  5/14   i/d-rectal  . EYE SURGERY  age 5   left  . HERNIA REPAIR    . INGUINAL HERNIA REPAIR Right 05/25/2013   Procedure: RIGHT INGUINAL HERNIA REPAIR  WITH MESH;  Surgeon: Imogene Burn. Georgette Dover, MD;  Location: [facility];  Service: General;  Laterality: Right;  . INSERTION OF MESH Right 05/25/2013   Procedure: INSERTION OF MESH;  Surgeon: Imogene Burn. Georgette Dover, MD;  Location: [facility];  Service: General;  Laterality: Right;  . JOINT  REPLACEMENT  10/2010   rt total knee  . KNEE SURGERY  2000   right  . NASAL SEPTUM SURGERY  2005  . TONSILLECTOMY  69 years old  . [place]   emergency trach from throat infection  . UMBILICAL HERNIA REPAIR  2001  . WISDOM TOOTH EXTRACTION      No current facility-administered medications for this encounter.   Current Outpatient Medications  Medication Sig Dispense Refill Last Dose  . albuterol (PROVENTIL HFA;VENTOLIN HFA) 108 (90 BASE) MCG/ACT inhaler Inhale 2 puffs into the lungs every 6 (six) hours as needed for wheezing.      Marland Kitchen amoxicillin (AMOXIL) 500 MG tablet Take 2,000 mg by mouth See admin instructions. Take 2000 mg 1 hour prior to dental work  1   . aspirin 325 MG EC tablet Take 325 mg by mouth daily.     Marland Kitchen atorvastatin (LIPITOR) 10 MG tablet Take 10 mg by mouth daily.     . Brimonidine Tartrate (LUMIFY) 0.025 % SOLN Place 1 drop into both eyes daily as needed (redness).     . cetirizine (ZYRTEC) 10 MG tablet Take 10 mg by mouth daily.     . fluocinonide cream (LIDEX) [id number] % Apply 1 application topically 2 (two) times daily as needed (outbreaks).     . fluticasone (FLOVENT HFA) 110 MCG/ACT inhaler Inhale 1 puff into the lungs daily as needed (allergies).      Marland Kitchen  hydrochlorothiazide (HYDRODIURIL) 25 MG tablet Take 25 mg by mouth daily.     . Multiple Vitamin (MULTIVITAMIN WITH MINERALS) TABS tablet Take 1 tablet by mouth 2 (two) times a week.     . naproxen sodium (ALEVE) 220 MG tablet Take 220 mg by mouth daily as needed (pain).     . Propylene Glycol 0.6 % SOLN Place 1 drop into both eyes daily as needed (dryness).     . sildenafil (REVATIO) 20 MG tablet Take 60 mg by mouth daily as needed for erectile dysfunction.      Allergies  Allergen Reactions  . Adhesive [Tape] Other (See Comments)    Skin bumps use paper tape  . Oxycodone Itching    Social History   Tobacco Use  . Smoking status: Light Tobacco Smoker    Last attempt to quit: 01/24/2012    Years  since quitting: 8.0  . Smokeless tobacco: Never Used  . Tobacco comment: smokes few cigs each year  Substance Use Topics  . Alcohol use: Yes    Comment: occasionally    Family History  Problem Relation Age of Onset  . Prostate cancer Father   . Heart disease Father   . ALS Mother   . Lymphoma Son      Review of Systems  Constitutional: Negative for chills and fever.  Respiratory: Negative for cough, chest tightness and shortness of breath.   Cardiovascular: Negative for chest pain.  Gastrointestinal: Negative for nausea and vomiting.  Musculoskeletal: Positive for arthralgias.    Objective:  Physical Exam Patient is a 68 year old male.  Well nourished and well developed. General: Alert and oriented x3, cooperative and pleasant, no acute distress. Head: normocephalic, atraumatic, neck supple. Eyes: EOMI. Respiratory: breath sounds clear in all fields, no wheezing, rales, or rhonchi. Cardiovascular: Regular rate and rhythm, no murmurs, gallops or rubs. Abdomen: non-tender to palpation and soft, normoactive bowel sounds.  Musculoskeletal: Left knee exam: Slight varus deformity. Tenderness medial greater than lateral. Range of motion is from 5 to 125 degrees. Marked crepitus with range of motion. Knee is stable.  Calves soft and nontender. Motor function intact in LE. Strength 5/5 LE bilaterally. Neuro: Distal pulses 2+. Sensation to light touch intact in LE. Vital signs in last 24 hours: Temp:  [98.4 F (36.9 C)] 98.4 F (36.9 C) (05/04 1022) Pulse Rate:  [85] 85 (05/04 1022) Resp:  [16] 16 (05/04 1022) BP: (136)/(97) 136/97 (05/04 1022) SpO2:  [100 %] 100 % (05/04 1022) Weight:  [88.5 kg] 88.5 kg (05/04 1022)  Labs:   Estimated body mass index is 29.65 kg/m as calculated from the following:   Height as of 02/26/20: 5\' 8"  (1.727 m).   Weight as of 02/26/20: 88.5 kg.   Imaging Review Plain radiographs demonstrate severe degenerative joint disease of the left  knee(s). The overall alignment isneutral. The bone quality appears to be adequate for age and reported activity level.  Assessment/Plan:  End stage arthritis, left knee   The patient history, physical examination, clinical judgment of the provider and imaging studies are consistent with end stage degenerative joint disease of the left knee(s) and total knee arthroplasty is deemed medically necessary. The treatment options including medical management, injection therapy arthroscopy and arthroplasty were discussed at length. The risks and benefits of total knee arthroplasty were presented and reviewed. The risks due to aseptic loosening, infection, stiffness, patella tracking problems, thromboembolic complications and other imponderables were discussed. The patient acknowledged the explanation, agreed to proceed with the  plan and consent was signed. Patient is being admitted for inpatient treatment for surgery, pain control, PT, OT, prophylactic antibiotics, VTE prophylaxis, progressive ambulation and ADL's and discharge planning. The patient is planning to be discharged home.   Therapy Plans: outpatient therapy at [facility] Disposition: Home with wife Planned DVT Prophylaxis: Xarelto 10mg  daily (hx PE) DME needed: walker PCP: Dr. London Pepper TXA: topical Allergies: Oxycodone - itching Anesthesia Concerns: none BMI: 28.5 Not diabetic.  Other: Planning on same day discharge. Hydrocodone does well for him. Hx of PE, s/p right knee scope in 2001, takes ASA 325 daily.  Patient's anticipated LOS is less than 2 midnights, meeting these requirements: - Younger than 49 - Lives within 1 hour of care - Has a competent adult at home to recover with post-op recover - NO history of  - Chronic pain requiring opiods  - Diabetes  - Coronary Artery Disease  - Heart failure  - Heart attack  - Stroke  - DVT/VTE  - Cardiac arrhythmia  - Respiratory Failure/COPD  - Renal failure  -  Anemia  - Advanced Liver disease  - Patient was instructed on what medications to stop prior to surgery. - Follow-up visit in 2 weeks with Dr. Wynelle Link - Begin physical therapy following surgery - Pre-operative lab work as pre-surgical testing - Prescriptions will be provided in hospital at time of discharge  Griffith Citron, PA-C Orthopedic Surgery [facility] [place] [phone]

## 2020-02-26 NOTE — Progress Notes (Signed)
PCP - Dr. Leia Alf Cardiologist - none  Chest x-ray - 11/02/19 EKG - 02/26/20 Stress Test - no ECHO - 2015 Cardiac Cath - no  Sleep Study - yes CPAP - no  Fasting Blood Sugar - NA Checks Blood Sugar _____ times a day  Blood Thinner Instructions:ASA Aspirin Instructions:Dr. Aluisio said to stop it 7days prior to DOS Last Dose:5/3  Anesthesia review:   Patient denies shortness of breath, fever, cough and chest pain at PAT appointment  yes Patient verbalized understanding of instructions that were given to them at the PAT appointment. Patient was also instructed that they will need to review over the PAT instructions again at home before surgery. yes

## 2020-02-28 ENCOUNTER — Other Ambulatory Visit (HOSPITAL_COMMUNITY)
Admission: RE | Admit: 2020-02-28 | Discharge: 2020-02-28 | Disposition: A | Discharge status: Home / Self Care | Payer: Medicare PPO | Source: Ambulatory Visit | Attending: Orthopedic Surgery | Admitting: Orthopedic Surgery

## 2020-02-28 DIAGNOSIS — Z20822 Contact with and (suspected) exposure to covid-19: Secondary | ICD-10-CM | POA: Diagnosis not present

## 2020-02-28 DIAGNOSIS — Z01812 Encounter for preprocedural laboratory examination: Secondary | ICD-10-CM | POA: Insufficient documentation

## 2020-02-28 LAB — SARS CORONAVIRUS 2 (TAT 6-24 HRS): SARS Coronavirus 2: NEGATIVE

## 2020-03-02 MED ORDER — BUPIVACAINE LIPOSOME 1.3 % IJ SUSP
20.0000 mL | Freq: Once | INTRAMUSCULAR | Status: DC
  Filled 2020-03-02: qty 20

## 2020-03-02 NOTE — Anesthesia Preprocedure Evaluation (Addendum)
Anesthesia Evaluation  Patient identified by MRN, date of birth, ID band Patient awake    Reviewed: Allergy & Precautions, NPO status , Patient's Chart, lab work & pertinent test results  History of Anesthesia Complications Negative for: history of anesthetic complications  Airway Mallampati: I  TM Distance: >3 FB Neck ROM: Full    Dental  (+) Dental Advisory Given   Pulmonary COPD, Current Smoker and Patient abstained from smoking.,  02/28/2020 SARS coronavirus NEG   breath sounds clear to auscultation       Cardiovascular hypertension, Pt. on medications (-) angina+ Peripheral Vascular Disease (temporal arteritis)   Rhythm:Regular Rate:Normal  '15 ECHO: EF 55-60%, valves OK   Neuro/Psych negative neurological ROS     GI/Hepatic Neg liver ROS, GERD  Controlled,  Endo/Other  negative endocrine ROS  Renal/GU negative Renal ROS     Musculoskeletal  (+) Arthritis ,   Abdominal   Peds  Hematology negative hematology ROS (+)   Anesthesia Other Findings   Reproductive/Obstetrics                            Anesthesia Physical Anesthesia Plan  ASA: II  Anesthesia Plan: Spinal   Post-op Pain Management:  Regional for Post-op pain   Induction:   PONV Risk Score and Plan: 0  Airway Management Planned: Natural Airway and Simple Face Mask  Additional Equipment: None  Intra-op Plan:   Post-operative Plan:   Informed Consent: I have reviewed the patients History and Physical, chart, labs and discussed the procedure including the risks, benefits and alternatives for the proposed anesthesia with the patient or authorized representative who has indicated his/her understanding and acceptance.     Dental advisory given  Plan Discussed with: CRNA and Surgeon  Anesthesia Plan Comments: (Plan routine monitors, SAB with adductor canal block for post op analgesia)       Anesthesia Quick  Evaluation

## 2020-03-03 ENCOUNTER — Encounter (HOSPITAL_COMMUNITY)
Admission: RE | Disposition: A | Discharge status: Home / Self Care | Payer: Self-pay | Source: Home / Self Care | Attending: Orthopedic Surgery

## 2020-03-03 ENCOUNTER — Ambulatory Visit (HOSPITAL_COMMUNITY)
Admission: RE | Admit: 2020-03-03 | Discharge: 2020-03-03 | Disposition: A | Discharge status: Home / Self Care | Payer: Medicare PPO | Attending: Orthopedic Surgery | Admitting: Orthopedic Surgery

## 2020-03-03 ENCOUNTER — Ambulatory Visit (HOSPITAL_COMMUNITY): Payer: Medicare PPO | Admitting: Anesthesiology

## 2020-03-03 ENCOUNTER — Other Ambulatory Visit: Payer: Self-pay

## 2020-03-03 ENCOUNTER — Encounter (HOSPITAL_COMMUNITY): Payer: Self-pay | Admitting: Orthopedic Surgery

## 2020-03-03 DIAGNOSIS — M1710 Unilateral primary osteoarthritis, unspecified knee: Secondary | ICD-10-CM | POA: Diagnosis present

## 2020-03-03 DIAGNOSIS — Z86711 Personal history of pulmonary embolism: Secondary | ICD-10-CM | POA: Insufficient documentation

## 2020-03-03 DIAGNOSIS — M1712 Unilateral primary osteoarthritis, left knee: Secondary | ICD-10-CM | POA: Insufficient documentation

## 2020-03-03 DIAGNOSIS — I739 Peripheral vascular disease, unspecified: Secondary | ICD-10-CM | POA: Insufficient documentation

## 2020-03-03 DIAGNOSIS — Z885 Allergy status to narcotic agent status: Secondary | ICD-10-CM | POA: Insufficient documentation

## 2020-03-03 DIAGNOSIS — I10 Essential (primary) hypertension: Secondary | ICD-10-CM | POA: Diagnosis not present

## 2020-03-03 DIAGNOSIS — F17290 Nicotine dependence, other tobacco product, uncomplicated: Secondary | ICD-10-CM | POA: Insufficient documentation

## 2020-03-03 DIAGNOSIS — Z79899 Other long term (current) drug therapy: Secondary | ICD-10-CM | POA: Diagnosis not present

## 2020-03-03 DIAGNOSIS — K219 Gastro-esophageal reflux disease without esophagitis: Secondary | ICD-10-CM | POA: Diagnosis not present

## 2020-03-03 DIAGNOSIS — J449 Chronic obstructive pulmonary disease, unspecified: Secondary | ICD-10-CM | POA: Diagnosis not present

## 2020-03-03 DIAGNOSIS — Z96651 Presence of right artificial knee joint: Secondary | ICD-10-CM | POA: Insufficient documentation

## 2020-03-03 DIAGNOSIS — M179 Osteoarthritis of knee, unspecified: Secondary | ICD-10-CM | POA: Diagnosis present

## 2020-03-03 HISTORY — PX: TOTAL KNEE ARTHROPLASTY: SHX125

## 2020-03-03 LAB — TYPE AND SCREEN
ABO/RH(D): O POS
Antibody Screen: NEGATIVE

## 2020-03-03 SURGERY — ARTHROPLASTY, KNEE, TOTAL
Anesthesia: Spinal | Site: Knee | Laterality: Left

## 2020-03-03 MED ORDER — BUPIVACAINE LIPOSOME 1.3 % IJ SUSP
INTRAMUSCULAR | Status: DC | PRN
  Administered 2020-03-03: 20 mL

## 2020-03-03 MED ORDER — HYDROMORPHONE HCL 1 MG/ML IJ SOLN
INTRAMUSCULAR | Status: AC
  Filled 2020-03-03: qty 1

## 2020-03-03 MED ORDER — DEXAMETHASONE SODIUM PHOSPHATE 10 MG/ML IJ SOLN: 8.0000 mg | Freq: Once | INTRAMUSCULAR | Status: DC

## 2020-03-03 MED ORDER — MIDAZOLAM HCL 2 MG/2ML IJ SOLN: 0.5000 mg | Freq: Once | INTRAMUSCULAR | Status: DC | PRN

## 2020-03-03 MED ORDER — CEFAZOLIN SODIUM-DEXTROSE 2-4 GM/100ML-% IV SOLN
INTRAVENOUS | Status: AC
  Administered 2020-03-03: 2 g via INTRAVENOUS
  Filled 2020-03-03: qty 100

## 2020-03-03 MED ORDER — TRAMADOL HCL 50 MG PO TABS: 50.0000 mg | ORAL_TABLET | Freq: Four times a day (QID) | ORAL | 0 refills | Status: AC | PRN

## 2020-03-03 MED ORDER — LACTATED RINGERS IV BOLUS
250.0000 mL | Freq: Once | INTRAVENOUS | Status: AC
  Administered 2020-03-03: 250 mL via INTRAVENOUS

## 2020-03-03 MED ORDER — METHOCARBAMOL 500 MG PO TABS: 500.0000 mg | ORAL_TABLET | Freq: Four times a day (QID) | ORAL | 0 refills | Status: DC | PRN

## 2020-03-03 MED ORDER — PROPOFOL 10 MG/ML IV BOLUS
INTRAVENOUS | Status: AC
  Filled 2020-03-03: qty 20

## 2020-03-03 MED ORDER — LIDOCAINE HCL (CARDIAC) PF 100 MG/5ML IV SOSY
PREFILLED_SYRINGE | INTRAVENOUS | Status: DC | PRN
  Administered 2020-03-03: 80 mg via INTRAVENOUS

## 2020-03-03 MED ORDER — SODIUM CHLORIDE 0.9 % IR SOLN
Status: DC | PRN
  Administered 2020-03-03: 1000 mL

## 2020-03-03 MED ORDER — SODIUM CHLORIDE (PF) 0.9 % IJ SOLN
INTRAMUSCULAR | Status: AC
  Filled 2020-03-03: qty 10

## 2020-03-03 MED ORDER — SODIUM CHLORIDE (PF) 0.9 % IJ SOLN
INTRAMUSCULAR | Status: DC | PRN
  Administered 2020-03-03: 60 mL

## 2020-03-03 MED ORDER — TRAMADOL HCL 50 MG PO TABS
50.0000 mg | ORAL_TABLET | Freq: Four times a day (QID) | ORAL | Status: DC | PRN
  Administered 2020-03-03: 50 mg via ORAL

## 2020-03-03 MED ORDER — SODIUM CHLORIDE (PF) 0.9 % IJ SOLN
INTRAMUSCULAR | Status: AC
  Filled 2020-03-03: qty 50

## 2020-03-03 MED ORDER — PROPOFOL 1000 MG/100ML IV EMUL
INTRAVENOUS | Status: AC
  Filled 2020-03-03: qty 100

## 2020-03-03 MED ORDER — ACETAMINOPHEN 10 MG/ML IV SOLN
1000.0000 mg | Freq: Four times a day (QID) | INTRAVENOUS | Status: DC
  Administered 2020-03-03: 08:00:00 1000 mg via INTRAVENOUS
  Filled 2020-03-03: qty 100

## 2020-03-03 MED ORDER — HYDROMORPHONE HCL 1 MG/ML IJ SOLN
0.2500 mg | INTRAMUSCULAR | Status: DC | PRN
  Administered 2020-03-03 (×2): 0.5 mg via INTRAVENOUS

## 2020-03-03 MED ORDER — LACTATED RINGERS IV SOLN: INTRAVENOUS | Status: DC

## 2020-03-03 MED ORDER — TRANEXAMIC ACID-NACL 1000-0.7 MG/100ML-% IV SOLN
INTRAVENOUS | Status: AC
  Filled 2020-03-03: qty 100

## 2020-03-03 MED ORDER — GABAPENTIN 300 MG PO CAPS
ORAL_CAPSULE | ORAL | 0 refills | Status: DC
Start: 2020-03-03 — End: 2021-05-28

## 2020-03-03 MED ORDER — TRANEXAMIC ACID 1000 MG/10ML IV SOLN
2000.0000 mg | Freq: Once | INTRAVENOUS | Status: DC
  Filled 2020-03-03: qty 20

## 2020-03-03 MED ORDER — MEPERIDINE HCL 50 MG/ML IJ SOLN: 6.2500 mg | INTRAMUSCULAR | Status: DC | PRN

## 2020-03-03 MED ORDER — LACTATED RINGERS IV BOLUS
500.0000 mL | Freq: Once | INTRAVENOUS | Status: AC
  Administered 2020-03-03: 500 mL via INTRAVENOUS

## 2020-03-03 MED ORDER — TRANEXAMIC ACID-NACL 1000-0.7 MG/100ML-% IV SOLN
INTRAVENOUS | Status: DC | PRN
  Administered 2020-03-03: 2000 mg via INTRAVENOUS

## 2020-03-03 MED ORDER — LACTATED RINGERS IV SOLN: INTRAVENOUS | Status: DC | PRN

## 2020-03-03 MED ORDER — STERILE WATER FOR IRRIGATION IR SOLN
Status: DC | PRN
  Administered 2020-03-03: 1000 mL

## 2020-03-03 MED ORDER — PHENYLEPHRINE HCL (PRESSORS) 10 MG/ML IV SOLN
INTRAVENOUS | Status: AC
  Filled 2020-03-03: qty 2

## 2020-03-03 MED ORDER — METHOCARBAMOL 500 MG PO TABS: 500.0000 mg | ORAL_TABLET | Freq: Four times a day (QID) | ORAL | Status: DC | PRN

## 2020-03-03 MED ORDER — MEPIVACAINE HCL (PF) 2 % IJ SOLN
INTRAMUSCULAR | Status: DC | PRN
  Administered 2020-03-03: 60 mg via INTRATHECAL

## 2020-03-03 MED ORDER — MIDAZOLAM HCL 2 MG/2ML IJ SOLN
1.0000 mg | INTRAMUSCULAR | Status: DC
  Administered 2020-03-03: 2 mg via INTRAVENOUS
  Filled 2020-03-03: qty 2

## 2020-03-03 MED ORDER — HYDROCODONE-ACETAMINOPHEN 7.5-325 MG PO TABS: 1.0000 | ORAL_TABLET | Freq: Four times a day (QID) | ORAL | 0 refills | Status: DC | PRN

## 2020-03-03 MED ORDER — FENTANYL CITRATE (PF) 100 MCG/2ML IJ SOLN
50.0000 ug | INTRAMUSCULAR | Status: DC
  Administered 2020-03-03: 100 ug via INTRAVENOUS
  Filled 2020-03-03: qty 2

## 2020-03-03 MED ORDER — DEXAMETHASONE SODIUM PHOSPHATE 10 MG/ML IJ SOLN
INTRAMUSCULAR | Status: DC | PRN
  Administered 2020-03-03: 10 mg via INTRAVENOUS

## 2020-03-03 MED ORDER — ROPIVACAINE HCL 7.5 MG/ML IJ SOLN
INTRAMUSCULAR | Status: DC | PRN
  Administered 2020-03-03: 20 mL via PERINEURAL

## 2020-03-03 MED ORDER — EPHEDRINE SULFATE-NACL 50-0.9 MG/10ML-% IV SOSY
PREFILLED_SYRINGE | INTRAVENOUS | Status: DC | PRN
  Administered 2020-03-03: 10 mg via INTRAVENOUS

## 2020-03-03 MED ORDER — METHOCARBAMOL 500 MG IVPB - SIMPLE MED
INTRAVENOUS | Status: AC
  Filled 2020-03-03: qty 50

## 2020-03-03 MED ORDER — PHENYLEPHRINE HCL-NACL 10-0.9 MG/250ML-% IV SOLN
INTRAVENOUS | Status: DC | PRN
Start: 2020-03-03 — End: 2020-03-03
  Administered 2020-03-03: 50 ug/min via INTRAVENOUS

## 2020-03-03 MED ORDER — PROPOFOL 10 MG/ML IV BOLUS
INTRAVENOUS | Status: DC | PRN
  Administered 2020-03-03: 20 mg via INTRAVENOUS

## 2020-03-03 MED ORDER — TRAMADOL HCL 50 MG PO TABS
ORAL_TABLET | ORAL | Status: AC
  Filled 2020-03-03: qty 1

## 2020-03-03 MED ORDER — PROPOFOL 500 MG/50ML IV EMUL
INTRAVENOUS | Status: DC | PRN
  Administered 2020-03-03: 100 ug/kg/min via INTRAVENOUS

## 2020-03-03 MED ORDER — CEFAZOLIN SODIUM-DEXTROSE 2-4 GM/100ML-% IV SOLN
2.0000 g | INTRAVENOUS | Status: AC
  Administered 2020-03-03: 2 g via INTRAVENOUS
  Filled 2020-03-03: qty 100

## 2020-03-03 MED ORDER — PROMETHAZINE HCL 25 MG/ML IJ SOLN: 6.2500 mg | INTRAMUSCULAR | Status: DC | PRN

## 2020-03-03 MED ORDER — METHOCARBAMOL 500 MG IVPB - SIMPLE MED
500.0000 mg | Freq: Four times a day (QID) | INTRAVENOUS | Status: DC | PRN
  Administered 2020-03-03: 500 mg via INTRAVENOUS

## 2020-03-03 MED ORDER — CEFAZOLIN SODIUM-DEXTROSE 2-4 GM/100ML-% IV SOLN: 2.0000 g | Freq: Four times a day (QID) | INTRAVENOUS | Status: DC

## 2020-03-03 MED ORDER — POVIDONE-IODINE 10 % EX SWAB
2.0000 "application " | Freq: Once | CUTANEOUS | Status: AC
  Administered 2020-03-03: 2 via TOPICAL

## 2020-03-03 MED ORDER — RIVAROXABAN 10 MG PO TABS
10.0000 mg | ORAL_TABLET | Freq: Every day | ORAL | 0 refills | Status: DC
Start: 2020-03-03 — End: 2021-05-28

## 2020-03-03 SURGICAL SUPPLY — 60 items
ATTUNE MED DOME PAT 41 KNEE (Knees) ×1 IMPLANT
ATTUNE PS FEM LT SZ 7 CEM KNEE (Femur) ×1 IMPLANT
ATTUNE PSRP INSR SZ7 10 KNEE (Insert) ×1 IMPLANT
BAG SPEC THK2 15X12 ZIP CLS (MISCELLANEOUS) ×1
BAG ZIPLOCK 12X15 (MISCELLANEOUS) ×2 IMPLANT
BASE TIBIAL ROT PLAT SZ 7 KNEE (Knees) IMPLANT
BLADE SAG 18X100X1.27 (BLADE) ×2 IMPLANT
BLADE SAW SGTL 11.0X1.19X90.0M (BLADE) ×2 IMPLANT
BLADE SURG SZ10 CARB STEEL (BLADE) ×4 IMPLANT
BNDG CMPR MED 10X6 ELC LF (GAUZE/BANDAGES/DRESSINGS) ×1
BNDG ELASTIC 6X10 VLCR STRL LF (GAUZE/BANDAGES/DRESSINGS) ×1 IMPLANT
BNDG ELASTIC 6X5.8 VLCR STR LF (GAUZE/BANDAGES/DRESSINGS) ×2 IMPLANT
BOWL SMART MIX CTS (DISPOSABLE) ×2 IMPLANT
BSPLAT TIB 7 CMNT ROT PLAT STR (Knees) ×1 IMPLANT
CEMENT HV SMART SET (Cement) ×3 IMPLANT
CLSR STERI-STRIP ANTIMIC 1/2X4 (GAUZE/BANDAGES/DRESSINGS) ×1 IMPLANT
COVER SURGICAL LIGHT HANDLE (MISCELLANEOUS) ×2 IMPLANT
COVER WAND RF STERILE (DRAPES) IMPLANT
CUFF TOURN SGL QUICK 34 (TOURNIQUET CUFF) ×2
CUFF TRNQT CYL 34X4.125X (TOURNIQUET CUFF) ×1 IMPLANT
DECANTER SPIKE VIAL GLASS SM (MISCELLANEOUS) ×2 IMPLANT
DRAPE U-SHAPE 47X51 STRL (DRAPES) ×2 IMPLANT
DRSG AQUACEL AG ADV 3.5X10 (GAUZE/BANDAGES/DRESSINGS) ×2 IMPLANT
DURAPREP 26ML APPLICATOR (WOUND CARE) ×2 IMPLANT
ELECT REM PT RETURN 15FT ADLT (MISCELLANEOUS) ×2 IMPLANT
EVACUATOR 1/8 PVC DRAIN (DRAIN) IMPLANT
GAUZE SPONGE 2X2 8PLY STRL LF (GAUZE/BANDAGES/DRESSINGS) ×1 IMPLANT
GLOVE BIO SURGEON STRL SZ7 (GLOVE) ×2 IMPLANT
GLOVE BIO SURGEON STRL SZ8 (GLOVE) ×2 IMPLANT
GLOVE BIOGEL PI IND STRL 7.0 (GLOVE) ×1 IMPLANT
GLOVE BIOGEL PI IND STRL 8 (GLOVE) ×1 IMPLANT
GLOVE BIOGEL PI INDICATOR 7.0 (GLOVE) ×1
GLOVE BIOGEL PI INDICATOR 8 (GLOVE) ×1
GOWN STRL REUS W/TWL LRG LVL3 (GOWN DISPOSABLE) ×4 IMPLANT
HANDPIECE INTERPULSE COAX TIP (DISPOSABLE) ×2
HOLDER FOLEY CATH W/STRAP (MISCELLANEOUS) IMPLANT
IMMOBILIZER KNEE 20 (SOFTGOODS) ×2
IMMOBILIZER KNEE 20 THIGH 36 (SOFTGOODS) ×1 IMPLANT
KIT TURNOVER KIT A (KITS) IMPLANT
MANIFOLD NEPTUNE II (INSTRUMENTS) ×2 IMPLANT
NS IRRIG 1000ML POUR BTL (IV SOLUTION) ×2 IMPLANT
PACK TOTAL KNEE CUSTOM (KITS) ×2 IMPLANT
PADDING CAST COTTON 6X4 STRL (CAST SUPPLIES) ×3 IMPLANT
PENCIL SMOKE EVACUATOR (MISCELLANEOUS) IMPLANT
PIN DRILL FIX HALF THREAD (BIT) ×1 IMPLANT
PIN STEINMAN FIXATION KNEE (PIN) ×1 IMPLANT
PROTECTOR NERVE ULNAR (MISCELLANEOUS) ×2 IMPLANT
SET HNDPC FAN SPRY TIP SCT (DISPOSABLE) ×1 IMPLANT
SPONGE GAUZE 2X2 STER 10/PKG (GAUZE/BANDAGES/DRESSINGS) ×1
STRIP CLOSURE SKIN 1/2X4 (GAUZE/BANDAGES/DRESSINGS) ×4 IMPLANT
SUT MNCRL AB 4-0 PS2 18 (SUTURE) ×2 IMPLANT
SUT STRATAFIX 0 PDS 27 VIOLET (SUTURE) ×2
SUT VIC AB 2-0 CT1 27 (SUTURE) ×6
SUT VIC AB 2-0 CT1 TAPERPNT 27 (SUTURE) ×3 IMPLANT
SUTURE STRATFX 0 PDS 27 VIOLET (SUTURE) ×1 IMPLANT
TIBIAL BASE ROT PLAT SZ 7 KNEE (Knees) ×2 IMPLANT
TRAY FOLEY MTR SLVR 16FR STAT (SET/KITS/TRAYS/PACK) ×2 IMPLANT
WATER STERILE IRR 1000ML POUR (IV SOLUTION) ×4 IMPLANT
WRAP KNEE MAXI GEL POST OP (GAUZE/BANDAGES/DRESSINGS) ×2 IMPLANT
YANKAUER SUCT BULB TIP 10FT TU (MISCELLANEOUS) ×2 IMPLANT

## 2020-03-03 NOTE — Anesthesia Procedure Notes (Signed)
Spinal  Patient location during procedure: OR End time: 03/03/2020 8:15 AM Staffing Performed: resident/CRNA  Resident/CRNA: Caryl Pina T, CRNA Preanesthetic Checklist Completed: patient identified, IV checked, site marked, risks and benefits discussed, surgical consent, monitors and equipment checked, pre-op evaluation and timeout performed Spinal Block Patient position: sitting Prep: ChloraPrep Patient monitoring: heart rate, cardiac monitor, continuous pulse ox and blood pressure Approach: midline Location: L3-4 Injection technique: single-shot Needle Needle type: Pencan  Needle gauge: 24 G Needle length: 9 cm Assessment Sensory level: T4 Additional Notes Expiration date of kit checked and confirmed. Patient tolerated procedure well, without complications.

## 2020-03-03 NOTE — Evaluation (Signed)
Physical Therapy Evaluation Patient Details Name: Jesse [name] MRN: [id number] DOB: Oct 22, 1951 Today's Date: 03/03/2020   History of Present Illness  s/p L TKA. PMH: R TKA, HTN  Clinical Impression  Patient evaluated by Physical Therapy with no further acute PT needs identified. All education has been completed and the patient has no further questions.   Ready to d/c with wife assist from PT standpoint. See below for any follow-up Physical Therapy or equipment needs. PT is signing off. Thank you for this referral.     Follow Up Recommendations Follow surgeon's recommendation for DC plan and follow-up therapies    Equipment Recommendations  Rolling walker with 5" wheels    Recommendations for Other Services       Precautions / Restrictions Precautions Precautions: Fall;Knee Restrictions Weight Bearing Restrictions: No Other Position/Activity Restrictions: WBAT      Mobility  Bed Mobility Overal bed mobility: Needs Assistance Bed Mobility: Supine to Sit;Sit to Supine     Supine to sit: Supervision Sit to supine: Supervision   General bed mobility comments: for safety  Transfers Overall transfer level: Needs assistance Equipment used: Rolling walker (2 wheeled) Transfers: Sit to/from Stand Sit to Stand: Supervision;Min guard         General transfer comment: cues for hand placement and LLE position  Ambulation/Gait Ambulation/Gait assistance: Min guard Gait Distance (Feet): 100 Feet Assistive device: Rolling walker (2 wheeled) Gait Pattern/deviations: Step-to pattern     General Gait Details: cues for sequence and RW position  Stairs        up down 2 steps with RW and min assist, wife present     Wheelchair Mobility    Modified Rankin (Stroke Patients Only)       Balance                                             Pertinent Vitals/Pain Pain Assessment: 0-10 Pain Score: 3  Pain Location: left knee Pain Descriptors /  Indicators: Grimacing;Sore Pain Intervention(s): Monitored during session;Limited activity within patient's tolerance    Home Living Family/patient expects to be discharged to:: Private residence Living Arrangements: Spouse/significant other Available Help at Discharge: Family Type of Home: House     Entrance [place] of Steps: 2 Home Layout: One level Home Equipment: Cane - single point;None      Prior Function Level of Independence: Independent               Hand Dominance        Extremity/Trunk Assessment   Upper Extremity Assessment Upper Extremity Assessment: Overall WFL for tasks assessed    Lower Extremity Assessment Lower Extremity Assessment: LLE deficits/detail LLE Deficits / Details: AAROM knee flexion ~ 5 to 60 degrees. ankle WFL. knee and hip grossly 2+/5 to 3/5       Communication      Cognition Arousal/Alertness: Awake/alert Behavior During Therapy: WFL for tasks assessed/performed Overall Cognitive Status: Within Functional Limits for tasks assessed                                        General Comments      Exercises Total Joint Exercises Ankle Circles/Pumps: AROM;10 reps;Left Quad Sets: AROM;10 reps;Left Heel Slides: AROM;AAROM;Left;10 reps Hip ABduction/ADduction: AROM;Left;10 reps Straight Leg Raises: AROM;10 reps;Left  Assessment/Plan    PT Assessment All further PT needs can be met in the next venue of care  PT Problem List         PT Treatment Interventions      PT Goals (Current goals can be found in the Care Plan section)  Acute Rehab PT Goals Patient Stated Goal: home PT Goal Formulation: All assessment and education complete, DC therapy    Frequency     Barriers to discharge        Co-evaluation               AM-PAC PT "6 Clicks" Mobility  Outcome Measure Help needed turning from your back to your side while in a flat bed without using bedrails?: A Little Help needed moving from  lying on your back to sitting on the side of a flat bed without using bedrails?: A Little Help needed moving to and from a bed to a chair (including a wheelchair)?: A Little Help needed standing up from a chair using your arms (e.g., wheelchair or bedside chair)?: A Little Help needed to walk in hospital room?: A Little Help needed climbing 3-5 steps with a railing? : A Little 6 Click Score: 18    End of Session Equipment Utilized During Treatment: Gait belt Activity Tolerance: Patient tolerated treatment well Patient left: with call bell/phone within reach;in bed;with family/visitor present Nurse Communication: Mobility status PT Visit Diagnosis: Difficulty in walking, not elsewhere classified (R26.2)    Time: 7619-5093 PT Time Calculation (min) (ACUTE ONLY): 39 min   Charges:   PT Evaluation $PT Eval Low Complexity: 1 Low PT Treatments $Gait Training: 8-22 mins $Therapeutic Exercise: 8-22 mins        Baxter Flattery, PT   Acute Rehab Dept [facility]): [phone]   03/03/2020   [facility] 03/03/2020, 1:37 PM

## 2020-03-03 NOTE — Interval H&P Note (Signed)
History and Physical Interval Note:  03/03/2020 6:32 AM  Jesse [name]  has presented today for surgery, with the diagnosis of left knee osteoarthritis.  The various methods of treatment have been discussed with the patient and family. After consideration of risks, benefits and other options for treatment, the patient has consented to  Procedure(s) with comments: TOTAL KNEE ARTHROPLASTY (Left) - 42min as a surgical intervention.  The patient's history has been reviewed, patient examined, no change in status, stable for surgery.  I have reviewed the patient's chart and labs.  Questions were answered to the patient's satisfaction.     Pilar Plate Vitaly Wanat

## 2020-03-03 NOTE — Anesthesia Procedure Notes (Signed)
Anesthesia Regional Block: Adductor canal block   Pre-Anesthetic Checklist: ,, timeout performed, Correct Patient, Correct Site, Correct Laterality, Correct Procedure, Correct Position, site marked, Risks and benefits discussed,  Surgical consent,  Pre-op evaluation,  At surgeon's request and post-op pain management  Laterality: Left and Lower  Prep: chloraprep       Needles:  Injection technique: Single-shot  Needle Type: Echogenic Needle     Needle Length: 9cm  Needle Gauge: 21     Additional Needles:   Procedures:,,,, ultrasound used (permanent image in chart),,,,  Narrative:  Start time: 03/03/2020 7:44 AM End time: 03/03/2020 7:50 AM Injection made incrementally with aspirations every 5 mL.  Performed by: Personally  Anesthesiologist: Annye Asa, MD  Additional Notes: Pt identified in Holding room.  Monitors applied. Working IV access confirmed. Sterile prep L thigh.  #21ga ECHOgenic needle into adductor canal.  20cc 0.75% Ropivacaine injected incrementally after negative test dose.  Patient asymptomatic, VSS, no heme aspirated, tolerated well.  Jenita Seashore, MD

## 2020-03-03 NOTE — Transfer of Care (Signed)
Immediate Anesthesia Transfer of Care Note  Patient: Jesse [name]  Procedure(s) Performed: TOTAL KNEE ARTHROPLASTY (Left Knee)  Patient Location: PACU  Anesthesia Type:Spinal  Level of Consciousness: awake, alert , oriented and patient cooperative  Airway & Oxygen Therapy: Patient Spontanous Breathing and Patient connected to face mask oxygen  Post-op Assessment: Report given to RN and Post -op Vital signs reviewed and stable  Post vital signs: Reviewed and stable  Last Vitals:  Vitals Value Taken Time  BP 120/78 03/03/20 0939  Temp    Pulse 71 03/03/20 0939  Resp 8 03/03/20 0939  SpO2 100 % 03/03/20 0939  Vitals shown include unvalidated device data.  Last Pain:  Vitals:   03/03/20 0622  TempSrc:   PainSc: 4       Patients Stated Pain Goal: 5 ([phone] [phone])  Complications: No apparent anesthesia complications

## 2020-03-03 NOTE — Progress Notes (Signed)
Assisted Dr. Carswell Jackson with left, ultrasound guided, adductor canal block. Side rails up, monitors on throughout procedure. See vital signs in flow sheet. Tolerated Procedure well.  

## 2020-03-03 NOTE — Anesthesia Postprocedure Evaluation (Signed)
Anesthesia Post Note  Patient: Cordella Register  Procedure(s) Performed: TOTAL KNEE ARTHROPLASTY (Left Knee)     Patient location during evaluation: PACU Anesthesia Type: Spinal Level of consciousness: awake and alert, oriented and patient cooperative Pain management: pain level controlled Vital Signs Assessment: post-procedure vital signs reviewed and stable Respiratory status: spontaneous breathing, nonlabored ventilation and respiratory function stable Cardiovascular status: blood pressure returned to baseline and stable Postop Assessment: no apparent nausea or vomiting and spinal receding Anesthetic complications: no    Last Vitals:  Vitals:   03/03/20 1030 03/03/20 1045  BP: (!) 121/94 125/89  Pulse: 77 62  Resp: 12 11  Temp:    SpO2: 100% 100%    Last Pain:  Vitals:   03/03/20 1045  TempSrc:   PainSc: Asleep                 Shanicqua Coldren,E. Adiana Smelcer

## 2020-03-03 NOTE — Discharge Instructions (Signed)
Jesse Arabian, MD Total Joint Specialist [facility] [address] [place], [place] [address] [phone]    TOTAL KNEE REPLACEMENT POSTOPERATIVE DIRECTIONS    Knee Rehabilitation, Guidelines Following Surgery  Results after knee surgery are often greatly improved when you follow the exercise, range of motion and muscle strengthening exercises prescribed by your doctor. Safety measures are also important to protect the knee from further injury. If any of these exercises cause you to have increased pain or swelling in your knee joint, decrease the amount until you are comfortable again and slowly increase them. If you have problems or questions, call your caregiver or physical therapist for advice.   BLOOD CLOT PREVENTION . Take a 10 mg Xarelto once a day for three weeks following surgery. Then resume one 325 mg Aspirin once a day. . You may resume your vitamins/supplements once you have discontinued the Xarelto.    HOME CARE INSTRUCTIONS  . Remove items at home which could result in a fall. This includes throw rugs or furniture in walking pathways.   ICE to the affected knee as much as tolerated. Icing helps control swelling. If the swelling is well controlled you will be more comfortable and rehab easier. Continue to use ice on the knee for pain and swelling from surgery. You may notice swelling that will progress down to the foot and ankle. This is normal after surgery. Elevate the leg when you are not up walking on it.    Continue to use the breathing machine which will help keep your temperature down.  It is common for your temperature to cycle up and down following surgery, especially at night when you are not up moving around and exerting yourself.  The breathing machine keeps your lungs expanded and your temperature down.  Do not place pillow under knee, focus on keeping the knee straight while resting  PAIN CONTROL Achieving adequate pain control  can be challenging in the first 48-72 hours after the nerve block wears off. During this time it is best to stay ahead of the pain by taking your prescribed pain meds every 4-6 hours. Once the pain is well controlled (you will not be pain free but the goal is having it under control) you may begin slowly weaning off the medications  DIET You may resume your previous home diet once you are discharged from the hospital.  DRESSING / [place] / SHOWERING . Keep your bulky bandage on for 2 days. On the third post-operative day you may remove the Ace bandage and gauze. There is a waterproof adhesive bandage on your skin which will stay in place until your first follow-up appointment. Once you remove this you will not need to place another bandage . You may begin showering 3 days following surgery, but do not submerge the incision under water.  ACTIVITY For the first 5 days the key is rest and control of pain and swelling . You should rest, ice and elevate the leg for 50 minutes out of every hour. Get up and walk/stretch for 10 minutes per hour. After 5 days you can increase your activity slowly as tolerated . Walk with your walker as instructed. Use the walker until you are comfortable transitioning to a cane. Walk with the cane in the opposite hand of the operative leg. You may discontinue the cane once you are comfortable. . You may discontinue the knee immobilizer once you are able to perform a straight leg raise while lying down .  Avoid periods of inactivity such as sitting longer than an hour when not asleep. This helps prevent blood clots.  . Do your home exercises twice a day starting on post-operative day 3. On the days you go to physical therapy, just do the home exercises once that day. . Do not drive a car until released by your surgeon.  . Do not drive while taking narcotics.  TED HOSE STOCKINGS Wear the elastic stockings on both legs for three weeks following surgery during the day. You  may remove them at night for sleeping.  WEIGHT BEARING You may bear weight as tolerated on the operative leg.  POSTOPERATIVE CONSTIPATION PROTOCOL Constipation - defined medically as fewer than three stools per week and severe constipation as less than one stool per week.  One of the most common issues patients have following surgery is constipation.  Even if you have a regular bowel pattern at home, your normal regimen is likely to be disrupted due to multiple reasons following surgery.  Combination of anesthesia, postoperative narcotics, change in appetite and fluid intake all can affect your bowels.  In order to avoid complications following surgery, here are some recommendations in order to help you during your recovery period.  Colace (docusate) - Pick up an over-the-counter form of Colace or another stool softener and take twice a day as long as you are requiring postoperative pain medications.  Take with a full glass of water daily.  If you experience loose stools or diarrhea, hold the colace until you stool forms back up.  If your symptoms do not get better within 1 week or if they get worse, check with your doctor.  MiraLax (polyethylene glycol) - Pick up over-the-counter to have on hand.  MiraLax is a solution that will increase the amount of water in your bowels to assist with bowel movements.  Take as directed and can mix with a glass of water, juice, soda, coffee, or tea.  Take if you go more than two days without a movement. Do not use MiraLax more than once per day. Call your doctor if you are still constipated or irregular after using this medication for 7 days in a row.  If you continue to have problems with postoperative constipation, please contact the office for further assistance and recommendations.  If you experience "the worst abdominal pain ever" or develop nausea or vomiting, please contact the office immediatly for further recommendations for treatment.  ITCHING  If you  experience itching with your medications, try taking only a single pain pill, or even half a pain pill at a time.  You can also use Benadryl over the counter for itching or also to help with sleep.   MEDICATIONS See your medication summary on the "After Visit Summary" that the nursing staff will review with you prior to discharge.  You may have some home medications which will be placed on hold until you complete the course of blood thinner medication.  It is important for you to complete the blood thinner medication as prescribed by your surgeon.  Continue your approved medications as instructed at time of discharge.  PRECAUTIONS If you experience chest pain or shortness of breath - call 911 immediately for transfer to the hospital emergency department.  If you develop a fever greater that 101 F, purulent drainage from wound, increased redness or drainage from wound, foul odor from the wound/dressing, or calf pain - CONTACT YOUR SURGEON.  FOLLOW-UP APPOINTMENTS Make sure you keep all of your appointments after your operation with your surgeon and caregivers. You should call the office at the above phone number and make an appointment for approximately two weeks after the date of your surgery or on the date instructed by your surgeon outlined in the "After Visit Summary".  MAKE SURE YOU:  . Understand these instructions.  . Get help right away if you are not doing well or get worse.   DENTAL ANTIBIOTICS:  In most cases prophylactic antibiotics for Dental procdeures after total joint surgery are not necessary.  Exceptions are as follows:  1. History of prior total joint infection  2. Severely immunocompromised (Organ Transplant, cancer chemotherapy, Rheumatoid biologic meds such as Gay)  3. Poorly controlled diabetes (A1C &gt; 8.0, blood glucose over 200)  If you have one of these conditions, contact your surgeon for an antibiotic  prescription, prior to your dental procedure.    Pick up stool softner and laxative for home use following surgery while on pain medications. May shower starting three days after surgery. Please use a clean towel to pat the incision dry following showers. Continue to use ice for pain and swelling after surgery. Do not use any lotions or creams on the incision until instructed by your surgeon.

## 2020-03-03 NOTE — Op Note (Signed)
OPERATIVE REPORT-TOTAL KNEE ARTHROPLASTY   Pre-operative diagnosis- Osteoarthritis  Left knee(s)  Post-operative diagnosis- Osteoarthritis Left knee(s)  Procedure-  Left  Total Knee Arthroplasty  Surgeon- Jesse Plover. Tigerlily Christine, MD  Assistant- Theresa Duty, PA-C   Anesthesia-  Adductor canal block and spinal  EBL-25 mL   Drains Hemovac  Tourniquet time- 38 minutes @ [id number] mm Hg    Complications- None  Condition-PACU - hemodynamically stable.   Brief Clinical Note   Jesse [name] is a 69 y.o. year old male with end stage OA of his left knee with progressively worsening pain and dysfunction. He has constant pain, with activity and at rest and significant functional deficits with difficulties even with ADLs. He has had extensive non-op management including analgesics, injections of cortisone and viscosupplements, and home exercise program, but remains in significant pain with significant dysfunction. Radiographs show bone on bone arthritis medial and patellofemoral. He presents now for left Total Knee Arthroplasty.     Procedure in detail---   The patient is brought into the operating room and positioned supine on the operating table. After successful administration of  Adductor canal block and spina,   a tourniquet is placed high on the  Left thigh(s) and the lower extremity is prepped and draped in the usual sterile fashion. Time out is performed by the operating team and then the  Left lower extremity is wrapped in Esmarch, knee flexed and the tourniquet inflated to 300 mmHg.       A midline incision is made with a ten blade through the subcutaneous tissue to the level of the extensor mechanism. A fresh blade is used to make a medial parapatellar arthrotomy. Soft tissue over the proximal medial tibia is subperiosteally elevated to the joint line with a knife and into the semimembranosus bursa with a Cobb elevator. Soft tissue over the proximal lateral tibia is elevated with attention  being paid to avoiding the patellar tendon on the tibial tubercle. The patella is everted, knee flexed 90 degrees and the ACL and PCL are removed. Findings are bone on bone medial and patellofemoral with lage global osteophytes.        The drill is used to create a starting hole in the distal femur and the canal is thoroughly irrigated with sterile saline to remove the fatty contents. The 5 degree Left  valgus alignment guide is placed into the femoral canal and the distal femoral cutting block is pinned to remove 9 mm off the distal femur. Resection is made with an oscillating saw.      The tibia is subluxed forward and the menisci are removed. The extramedullary alignment guide is placed referencing proximally at the medial aspect of the tibial tubercle and distally along the second metatarsal axis and tibial crest. The block is pinned to remove 66mm off the more deficient medial  side. Resection is made with an oscillating saw. Size 7is the most appropriate size for the tibia and the proximal tibia is prepared with the modular drill and keel punch for that size.      The femoral sizing guide is placed and size 7 is most appropriate. Rotation is marked off the epicondylar axis and confirmed by creating a rectangular flexion gap at 90 degrees. The size 7 cutting block is pinned in this rotation and the anterior, posterior and chamfer cuts are made with the oscillating saw. The intercondylar block is then placed and that cut is made.      Trial size 7 tibial component,  trial size 7 posterior stabilized femur and a 10  mm posterior stabilized rotating platform insert trial is placed. Full extension is achieved with excellent varus/valgus and anterior/posterior balance throughout full range of motion. The patella is everted and thickness measured to be 27  mm. Free hand resection is taken to 15 mm, a 41 template is placed, lug holes are drilled, trial patella is placed, and it tracks normally. Osteophytes are  removed off the posterior femur with the trial in place. All trials are removed and the cut bone surfaces prepared with pulsatile lavage. Cement is mixed and once ready for implantation, the size 7 tibial implant, size  7 posterior stabilized femoral component, and the size 41 patella are cemented in place and the patella is held with the clamp. The trial insert is placed and the knee held in full extension. The Exparel (20 ml mixed with 60 ml saline) is injected into the extensor mechanism, posterior capsule, medial and lateral gutters and subcutaneous tissues.  All extruded cement is removed and once the cement is hard the permanent 10 mm posterior stabilized rotating platform insert is placed into the tibial tray.      The wound is copiously irrigated with saline solution and the extensor mechanism closed over a hemovac drain with #1 V-loc suture. The tourniquet is released for a total tourniquet time of 38  minutes. Flexion against gravity is 140 degrees and the patella tracks normally. Subcutaneous tissue is closed with 2.0 vicryl and subcuticular with running 4.0 Monocryl. The incision is cleaned and dried and steri-strips and a bulky sterile dressing are applied. The limb is placed into a knee immobilizer and the patient is awakened and transported to recovery in stable condition.      Please note that a surgical assistant was a medical necessity for this procedure in order to perform it in a safe and expeditious manner. Surgical assistant was necessary to retract the ligaments and vital neurovascular structures to prevent injury to them and also necessary for proper positioning of the limb to allow for anatomic placement of the prosthesis.   Jesse Plover Promise Bushong, MD    03/03/2020, 9:16 AM

## 2020-03-04 ENCOUNTER — Encounter: Payer: Self-pay | Admitting: *Deleted

## 2020-03-25 DIAGNOSIS — M6281 Muscle weakness (generalized): Secondary | ICD-10-CM | POA: Diagnosis not present

## 2020-03-25 DIAGNOSIS — M25562 Pain in left knee: Secondary | ICD-10-CM | POA: Diagnosis not present

## 2020-03-25 DIAGNOSIS — Z96652 Presence of left artificial knee joint: Secondary | ICD-10-CM | POA: Diagnosis not present

## 2020-03-25 DIAGNOSIS — R269 Unspecified abnormalities of gait and mobility: Secondary | ICD-10-CM | POA: Diagnosis not present

## 2020-03-25 DIAGNOSIS — M25662 Stiffness of left knee, not elsewhere classified: Secondary | ICD-10-CM | POA: Diagnosis not present

## 2020-03-27 DIAGNOSIS — M6281 Muscle weakness (generalized): Secondary | ICD-10-CM | POA: Diagnosis not present

## 2020-03-27 DIAGNOSIS — M25562 Pain in left knee: Secondary | ICD-10-CM | POA: Diagnosis not present

## 2020-03-27 DIAGNOSIS — R269 Unspecified abnormalities of gait and mobility: Secondary | ICD-10-CM | POA: Diagnosis not present

## 2020-03-27 DIAGNOSIS — M25662 Stiffness of left knee, not elsewhere classified: Secondary | ICD-10-CM | POA: Diagnosis not present

## 2020-03-27 DIAGNOSIS — Z96652 Presence of left artificial knee joint: Secondary | ICD-10-CM | POA: Diagnosis not present

## 2020-03-31 DIAGNOSIS — M25562 Pain in left knee: Secondary | ICD-10-CM | POA: Diagnosis not present

## 2020-03-31 DIAGNOSIS — Z96652 Presence of left artificial knee joint: Secondary | ICD-10-CM | POA: Diagnosis not present

## 2020-03-31 DIAGNOSIS — R269 Unspecified abnormalities of gait and mobility: Secondary | ICD-10-CM | POA: Diagnosis not present

## 2020-03-31 DIAGNOSIS — M6281 Muscle weakness (generalized): Secondary | ICD-10-CM | POA: Diagnosis not present

## 2020-03-31 DIAGNOSIS — M25662 Stiffness of left knee, not elsewhere classified: Secondary | ICD-10-CM | POA: Diagnosis not present

## 2020-04-03 DIAGNOSIS — M25562 Pain in left knee: Secondary | ICD-10-CM | POA: Diagnosis not present

## 2020-04-03 DIAGNOSIS — Z96652 Presence of left artificial knee joint: Secondary | ICD-10-CM | POA: Diagnosis not present

## 2020-04-03 DIAGNOSIS — R269 Unspecified abnormalities of gait and mobility: Secondary | ICD-10-CM | POA: Diagnosis not present

## 2020-04-03 DIAGNOSIS — M25662 Stiffness of left knee, not elsewhere classified: Secondary | ICD-10-CM | POA: Diagnosis not present

## 2020-04-03 DIAGNOSIS — M6281 Muscle weakness (generalized): Secondary | ICD-10-CM | POA: Diagnosis not present

## 2020-04-07 DIAGNOSIS — M25562 Pain in left knee: Secondary | ICD-10-CM | POA: Diagnosis not present

## 2020-04-07 DIAGNOSIS — M25662 Stiffness of left knee, not elsewhere classified: Secondary | ICD-10-CM | POA: Diagnosis not present

## 2020-04-07 DIAGNOSIS — Z96652 Presence of left artificial knee joint: Secondary | ICD-10-CM | POA: Diagnosis not present

## 2020-04-07 DIAGNOSIS — R269 Unspecified abnormalities of gait and mobility: Secondary | ICD-10-CM | POA: Diagnosis not present

## 2020-04-07 DIAGNOSIS — M6281 Muscle weakness (generalized): Secondary | ICD-10-CM | POA: Diagnosis not present

## 2020-04-08 DIAGNOSIS — Z471 Aftercare following joint replacement surgery: Secondary | ICD-10-CM | POA: Diagnosis not present

## 2020-04-08 DIAGNOSIS — Z96652 Presence of left artificial knee joint: Secondary | ICD-10-CM | POA: Diagnosis not present

## 2020-04-10 DIAGNOSIS — M25562 Pain in left knee: Secondary | ICD-10-CM | POA: Diagnosis not present

## 2020-04-10 DIAGNOSIS — R269 Unspecified abnormalities of gait and mobility: Secondary | ICD-10-CM | POA: Diagnosis not present

## 2020-04-10 DIAGNOSIS — Z96652 Presence of left artificial knee joint: Secondary | ICD-10-CM | POA: Diagnosis not present

## 2020-04-10 DIAGNOSIS — M25662 Stiffness of left knee, not elsewhere classified: Secondary | ICD-10-CM | POA: Diagnosis not present

## 2020-04-10 DIAGNOSIS — M6281 Muscle weakness (generalized): Secondary | ICD-10-CM | POA: Diagnosis not present

## 2020-04-14 DIAGNOSIS — M25562 Pain in left knee: Secondary | ICD-10-CM | POA: Diagnosis not present

## 2020-04-14 DIAGNOSIS — Z96652 Presence of left artificial knee joint: Secondary | ICD-10-CM | POA: Diagnosis not present

## 2020-04-14 DIAGNOSIS — M6281 Muscle weakness (generalized): Secondary | ICD-10-CM | POA: Diagnosis not present

## 2020-04-14 DIAGNOSIS — R269 Unspecified abnormalities of gait and mobility: Secondary | ICD-10-CM | POA: Diagnosis not present

## 2020-04-14 DIAGNOSIS — M25662 Stiffness of left knee, not elsewhere classified: Secondary | ICD-10-CM | POA: Diagnosis not present

## 2020-04-17 DIAGNOSIS — R269 Unspecified abnormalities of gait and mobility: Secondary | ICD-10-CM | POA: Diagnosis not present

## 2020-04-17 DIAGNOSIS — M25662 Stiffness of left knee, not elsewhere classified: Secondary | ICD-10-CM | POA: Diagnosis not present

## 2020-04-17 DIAGNOSIS — M6281 Muscle weakness (generalized): Secondary | ICD-10-CM | POA: Diagnosis not present

## 2020-04-17 DIAGNOSIS — M25562 Pain in left knee: Secondary | ICD-10-CM | POA: Diagnosis not present

## 2020-04-17 DIAGNOSIS — Z96652 Presence of left artificial knee joint: Secondary | ICD-10-CM | POA: Diagnosis not present

## 2020-04-21 DIAGNOSIS — M25662 Stiffness of left knee, not elsewhere classified: Secondary | ICD-10-CM | POA: Diagnosis not present

## 2020-04-21 DIAGNOSIS — R269 Unspecified abnormalities of gait and mobility: Secondary | ICD-10-CM | POA: Diagnosis not present

## 2020-04-21 DIAGNOSIS — M6281 Muscle weakness (generalized): Secondary | ICD-10-CM | POA: Diagnosis not present

## 2020-04-21 DIAGNOSIS — M25562 Pain in left knee: Secondary | ICD-10-CM | POA: Diagnosis not present

## 2020-04-21 DIAGNOSIS — Z96652 Presence of left artificial knee joint: Secondary | ICD-10-CM | POA: Diagnosis not present

## 2020-04-23 DIAGNOSIS — M25562 Pain in left knee: Secondary | ICD-10-CM | POA: Diagnosis not present

## 2020-04-23 DIAGNOSIS — M6281 Muscle weakness (generalized): Secondary | ICD-10-CM | POA: Diagnosis not present

## 2020-04-23 DIAGNOSIS — R269 Unspecified abnormalities of gait and mobility: Secondary | ICD-10-CM | POA: Diagnosis not present

## 2020-04-23 DIAGNOSIS — M25662 Stiffness of left knee, not elsewhere classified: Secondary | ICD-10-CM | POA: Diagnosis not present

## 2020-04-23 DIAGNOSIS — Z96652 Presence of left artificial knee joint: Secondary | ICD-10-CM | POA: Diagnosis not present

## 2020-05-05 DIAGNOSIS — R269 Unspecified abnormalities of gait and mobility: Secondary | ICD-10-CM | POA: Diagnosis not present

## 2020-05-05 DIAGNOSIS — M25562 Pain in left knee: Secondary | ICD-10-CM | POA: Diagnosis not present

## 2020-05-05 DIAGNOSIS — Z96652 Presence of left artificial knee joint: Secondary | ICD-10-CM | POA: Diagnosis not present

## 2020-05-05 DIAGNOSIS — M25662 Stiffness of left knee, not elsewhere classified: Secondary | ICD-10-CM | POA: Diagnosis not present

## 2020-05-05 DIAGNOSIS — M6281 Muscle weakness (generalized): Secondary | ICD-10-CM | POA: Diagnosis not present

## 2020-05-08 DIAGNOSIS — R269 Unspecified abnormalities of gait and mobility: Secondary | ICD-10-CM | POA: Diagnosis not present

## 2020-05-08 DIAGNOSIS — Z96652 Presence of left artificial knee joint: Secondary | ICD-10-CM | POA: Diagnosis not present

## 2020-05-08 DIAGNOSIS — M25562 Pain in left knee: Secondary | ICD-10-CM | POA: Diagnosis not present

## 2020-05-08 DIAGNOSIS — M25662 Stiffness of left knee, not elsewhere classified: Secondary | ICD-10-CM | POA: Diagnosis not present

## 2020-05-08 DIAGNOSIS — M6281 Muscle weakness (generalized): Secondary | ICD-10-CM | POA: Diagnosis not present

## 2020-06-03 DIAGNOSIS — J029 Acute pharyngitis, unspecified: Secondary | ICD-10-CM | POA: Diagnosis not present

## 2020-06-04 DIAGNOSIS — J029 Acute pharyngitis, unspecified: Secondary | ICD-10-CM | POA: Diagnosis not present

## 2020-06-04 DIAGNOSIS — Z20822 Contact with and (suspected) exposure to covid-19: Secondary | ICD-10-CM | POA: Diagnosis not present

## 2020-06-04 DIAGNOSIS — R0981 Nasal congestion: Secondary | ICD-10-CM | POA: Diagnosis not present

## 2020-06-07 DIAGNOSIS — Z809 Family history of malignant neoplasm, unspecified: Secondary | ICD-10-CM | POA: Diagnosis not present

## 2020-06-07 DIAGNOSIS — E785 Hyperlipidemia, unspecified: Secondary | ICD-10-CM | POA: Diagnosis not present

## 2020-06-07 DIAGNOSIS — Z791 Long term (current) use of non-steroidal anti-inflammatories (NSAID): Secondary | ICD-10-CM | POA: Diagnosis not present

## 2020-06-07 DIAGNOSIS — M1990 Unspecified osteoarthritis, unspecified site: Secondary | ICD-10-CM | POA: Diagnosis not present

## 2020-06-07 DIAGNOSIS — Z7951 Long term (current) use of inhaled steroids: Secondary | ICD-10-CM | POA: Diagnosis not present

## 2020-06-07 DIAGNOSIS — J45909 Unspecified asthma, uncomplicated: Secondary | ICD-10-CM | POA: Diagnosis not present

## 2020-06-07 DIAGNOSIS — I10 Essential (primary) hypertension: Secondary | ICD-10-CM | POA: Diagnosis not present

## 2020-06-07 DIAGNOSIS — Z7982 Long term (current) use of aspirin: Secondary | ICD-10-CM | POA: Diagnosis not present

## 2020-06-07 DIAGNOSIS — G629 Polyneuropathy, unspecified: Secondary | ICD-10-CM | POA: Diagnosis not present

## 2020-06-19 DIAGNOSIS — R490 Dysphonia: Secondary | ICD-10-CM | POA: Diagnosis not present

## 2020-06-19 DIAGNOSIS — I10 Essential (primary) hypertension: Secondary | ICD-10-CM | POA: Diagnosis not present

## 2020-06-23 DIAGNOSIS — R490 Dysphonia: Secondary | ICD-10-CM | POA: Diagnosis not present

## 2020-07-16 DIAGNOSIS — Z1211 Encounter for screening for malignant neoplasm of colon: Secondary | ICD-10-CM | POA: Diagnosis not present

## 2020-07-16 DIAGNOSIS — E785 Hyperlipidemia, unspecified: Secondary | ICD-10-CM | POA: Diagnosis not present

## 2020-07-16 DIAGNOSIS — Z23 Encounter for immunization: Secondary | ICD-10-CM | POA: Diagnosis not present

## 2020-07-16 DIAGNOSIS — R490 Dysphonia: Secondary | ICD-10-CM | POA: Diagnosis not present

## 2020-07-16 DIAGNOSIS — I10 Essential (primary) hypertension: Secondary | ICD-10-CM | POA: Diagnosis not present

## 2020-07-16 DIAGNOSIS — Z0000 Encounter for general adult medical examination without abnormal findings: Secondary | ICD-10-CM | POA: Diagnosis not present

## 2020-07-22 DIAGNOSIS — J370 Chronic laryngitis: Secondary | ICD-10-CM | POA: Diagnosis not present

## 2020-07-22 DIAGNOSIS — J383 Other diseases of vocal cords: Secondary | ICD-10-CM | POA: Diagnosis not present

## 2020-07-22 DIAGNOSIS — R490 Dysphonia: Secondary | ICD-10-CM | POA: Diagnosis not present

## 2020-07-22 DIAGNOSIS — J3489 Other specified disorders of nose and nasal sinuses: Secondary | ICD-10-CM | POA: Diagnosis not present

## 2020-08-19 DIAGNOSIS — R490 Dysphonia: Secondary | ICD-10-CM | POA: Diagnosis not present

## 2020-08-19 DIAGNOSIS — J383 Other diseases of vocal cords: Secondary | ICD-10-CM | POA: Diagnosis not present

## 2020-08-19 DIAGNOSIS — J370 Chronic laryngitis: Secondary | ICD-10-CM | POA: Diagnosis not present

## 2020-08-26 DIAGNOSIS — J370 Chronic laryngitis: Secondary | ICD-10-CM | POA: Diagnosis not present

## 2020-08-26 DIAGNOSIS — R490 Dysphonia: Secondary | ICD-10-CM | POA: Diagnosis not present

## 2020-08-26 DIAGNOSIS — J383 Other diseases of vocal cords: Secondary | ICD-10-CM | POA: Diagnosis not present

## 2020-08-29 DIAGNOSIS — M7061 Trochanteric bursitis, right hip: Secondary | ICD-10-CM | POA: Diagnosis not present

## 2020-08-29 DIAGNOSIS — M1611 Unilateral primary osteoarthritis, right hip: Secondary | ICD-10-CM | POA: Diagnosis not present

## 2020-09-03 DIAGNOSIS — M7061 Trochanteric bursitis, right hip: Secondary | ICD-10-CM | POA: Diagnosis not present

## 2020-09-08 DIAGNOSIS — M7061 Trochanteric bursitis, right hip: Secondary | ICD-10-CM | POA: Diagnosis not present

## 2020-09-11 DIAGNOSIS — M7061 Trochanteric bursitis, right hip: Secondary | ICD-10-CM | POA: Diagnosis not present

## 2020-09-12 DIAGNOSIS — J383 Other diseases of vocal cords: Secondary | ICD-10-CM | POA: Diagnosis not present

## 2020-09-12 DIAGNOSIS — J370 Chronic laryngitis: Secondary | ICD-10-CM | POA: Diagnosis not present

## 2020-09-12 DIAGNOSIS — R490 Dysphonia: Secondary | ICD-10-CM | POA: Diagnosis not present

## 2020-09-23 DIAGNOSIS — M7061 Trochanteric bursitis, right hip: Secondary | ICD-10-CM | POA: Diagnosis not present

## 2020-09-25 DIAGNOSIS — J370 Chronic laryngitis: Secondary | ICD-10-CM | POA: Diagnosis not present

## 2020-09-25 DIAGNOSIS — J383 Other diseases of vocal cords: Secondary | ICD-10-CM | POA: Diagnosis not present

## 2020-09-25 DIAGNOSIS — R490 Dysphonia: Secondary | ICD-10-CM | POA: Diagnosis not present

## 2020-09-25 DIAGNOSIS — M7061 Trochanteric bursitis, right hip: Secondary | ICD-10-CM | POA: Diagnosis not present

## 2020-09-30 DIAGNOSIS — M7061 Trochanteric bursitis, right hip: Secondary | ICD-10-CM | POA: Diagnosis not present

## 2020-10-02 DIAGNOSIS — M7061 Trochanteric bursitis, right hip: Secondary | ICD-10-CM | POA: Diagnosis not present

## 2020-10-07 DIAGNOSIS — L2084 Intrinsic (allergic) eczema: Secondary | ICD-10-CM | POA: Diagnosis not present

## 2020-10-08 DIAGNOSIS — M7061 Trochanteric bursitis, right hip: Secondary | ICD-10-CM | POA: Diagnosis not present

## 2020-10-09 DIAGNOSIS — J370 Chronic laryngitis: Secondary | ICD-10-CM | POA: Diagnosis not present

## 2020-10-09 DIAGNOSIS — J383 Other diseases of vocal cords: Secondary | ICD-10-CM | POA: Diagnosis not present

## 2020-10-09 DIAGNOSIS — R490 Dysphonia: Secondary | ICD-10-CM | POA: Diagnosis not present

## 2020-10-10 DIAGNOSIS — M7061 Trochanteric bursitis, right hip: Secondary | ICD-10-CM | POA: Diagnosis not present

## 2020-10-13 DIAGNOSIS — M7061 Trochanteric bursitis, right hip: Secondary | ICD-10-CM | POA: Diagnosis not present

## 2020-10-28 DIAGNOSIS — J383 Other diseases of vocal cords: Secondary | ICD-10-CM | POA: Diagnosis not present

## 2020-10-28 DIAGNOSIS — R490 Dysphonia: Secondary | ICD-10-CM | POA: Diagnosis not present

## 2020-10-28 DIAGNOSIS — J370 Chronic laryngitis: Secondary | ICD-10-CM | POA: Diagnosis not present

## 2020-11-04 DIAGNOSIS — J383 Other diseases of vocal cords: Secondary | ICD-10-CM | POA: Diagnosis not present

## 2020-11-04 DIAGNOSIS — R490 Dysphonia: Secondary | ICD-10-CM | POA: Diagnosis not present

## 2020-11-04 DIAGNOSIS — J370 Chronic laryngitis: Secondary | ICD-10-CM | POA: Diagnosis not present

## 2020-11-07 DIAGNOSIS — L2084 Intrinsic (allergic) eczema: Secondary | ICD-10-CM | POA: Diagnosis not present

## 2020-11-18 DIAGNOSIS — J370 Chronic laryngitis: Secondary | ICD-10-CM | POA: Diagnosis not present

## 2020-11-18 DIAGNOSIS — R490 Dysphonia: Secondary | ICD-10-CM | POA: Diagnosis not present

## 2020-11-18 DIAGNOSIS — J383 Other diseases of vocal cords: Secondary | ICD-10-CM | POA: Diagnosis not present

## 2020-11-26 DIAGNOSIS — R21 Rash and other nonspecific skin eruption: Secondary | ICD-10-CM | POA: Diagnosis not present

## 2021-02-02 DIAGNOSIS — J383 Other diseases of vocal cords: Secondary | ICD-10-CM | POA: Diagnosis not present

## 2021-02-02 DIAGNOSIS — J370 Chronic laryngitis: Secondary | ICD-10-CM | POA: Diagnosis not present

## 2021-02-02 DIAGNOSIS — R490 Dysphonia: Secondary | ICD-10-CM | POA: Diagnosis not present

## 2021-04-02 DIAGNOSIS — Z96652 Presence of left artificial knee joint: Secondary | ICD-10-CM | POA: Diagnosis not present

## 2021-04-15 ENCOUNTER — Telehealth: Payer: Self-pay | Admitting: Internal Medicine

## 2021-04-15 NOTE — Telephone Encounter (Signed)
Hey Dr. Carlean Purl,  We received a referral in our office for colonoscopy. Patient last procedure was 2016. I have the records and will send for review. Could you please advise on scheduling?  Thank you

## 2021-04-23 ENCOUNTER — Encounter: Payer: Self-pay | Admitting: Internal Medicine

## 2021-04-23 NOTE — Telephone Encounter (Signed)
Does notChart reviewed.  He was recommend to repeat a colonoscopy 5 years after he had 1 in 2021 due to a family history of colon polyps.  Current guidelines would indicate need another colonoscopy in 2026 10 years from his last one.    I recommend we put him on Dr. Dayle Points recall list for September 2026.  The patient should be advised that this recommendation is based upon him not having bleeding or changes in bowel habits or other gastrointestinal signs or symptoms at this time.  If he is having problems he should let us know and we can see him in the clinic.

## 2021-04-29 ENCOUNTER — Encounter: Payer: Self-pay | Admitting: Gastroenterology

## 2021-04-29 NOTE — Telephone Encounter (Signed)
Thank you Dr. Carlean Purl.   Patient requesting to have an OV because of his family history with cancer. States he does not want to wait till 2026 and have been getting colonoscopy every 5 years since the 90s. I set up an OV with Dr. Candis Schatz for 8/4 to further discuss.  Thank you.

## 2021-05-28 ENCOUNTER — Encounter: Payer: Self-pay | Admitting: Gastroenterology

## 2021-05-28 ENCOUNTER — Ambulatory Visit: Payer: Medicare PPO | Admitting: Gastroenterology

## 2021-05-28 VITALS — BP 100/70 | HR 80 | Ht 67.5 in | Wt 197.4 lb

## 2021-05-28 DIAGNOSIS — Z800 Family history of malignant neoplasm of digestive organs: Secondary | ICD-10-CM

## 2021-05-28 DIAGNOSIS — Z1211 Encounter for screening for malignant neoplasm of colon: Secondary | ICD-10-CM

## 2021-05-28 NOTE — Progress Notes (Signed)
HPI : Mr. Jesse [name] is a very pleasant 70 year old male referred by Dr. London Pepper for screening colonoscopy.  Previously, the patient had been followed by [facility], but the patient had a very difficult time trying to get an appointment with them and so elected to switch to [facility].  The patient states that he has been getting a colonoscopy every 5 years on average ever since 1990.  He has been doing this because his grandmother was diagnosed with colon cancer in her 36s, and he was told by his primary care provider back then hat he needed to get a colonoscopy every 5 years.  The patient states that he has never had a polyp removed on any of his many colonoscopies.  The patient also denies any other family members with colon cancer other than his grandmother.  His father had prostate cancer and an uncle had a cancer of unknown etiology, and his son has Hodgkin's lymphoma. His last colonoscopy was in 2016 performed by [facility] and this was normal.  He was recommended to repeat in 5 years. The patient denies any history of chronic GI symptoms.  However he was having issues with small amounts of fecal seepage after bowel movements and his PCM recommended taking fiber.  He has been taking the fiber and this has completely resolved his seepage.    Past Medical History:  Diagnosis Date   Arthritis    knees   Asthma    seasonal   Eczema    GERD (gastroesophageal reflux disease)    HLD (hyperlipidemia)    HTN (hypertension)    Pulmonary emboli ([place])    post op knee scope for a few months   Sleep apnea 2004   TA (temporal arteritis) ([place])    Wears glasses      Past Surgical History:  Procedure Laterality Date   ABCESS DRAINAGE  02/22/2013   i/d-rectal   COLONOSCOPY  06/2015   No polyps, done for family history of colon polyps Next would be 2026   EYE SURGERY Left age 40   lazy eye   INGUINAL HERNIA REPAIR Right 05/25/2013   Procedure: RIGHT INGUINAL HERNIA REPAIR  WITH MESH;   Surgeon: Imogene Burn. Georgette Dover, MD;  Location: [facility];  Service: General;  Laterality: Right;   INSERTION OF MESH Right 05/25/2013   Procedure: INSERTION OF MESH;  Surgeon: Imogene Burn. Georgette Dover, MD;  Location: [facility];  Service: General;  Laterality: Right;   NASAL SEPTUM SURGERY  10/26/2003   TONSILLECTOMY  70 years old   TOTAL KNEE ARTHROPLASTY Right 10/25/2010   rt total knee   TOTAL KNEE ARTHROPLASTY Left 03/03/2020   Procedure: TOTAL KNEE ARTHROPLASTY;  Surgeon: Gaynelle Arabian, MD;  Location: [facility];  Service: Orthopedics;  Laterality: Left;  49mn   TRACHEAL SURGERY  10/26/1983   emergency trach from throat infection   UMBILICAL HERNIA REPAIR  10/26/1999   WISDOM TOOTH EXTRACTION     Family History  Problem Relation Age of Onset   ALS Mother    Prostate cancer Father    Heart disease Father    Colon cancer Maternal Grandmother    Hodgkin's lymphoma Son    Brain cancer Paternal Uncle    Social History   Tobacco Use   Smoking status: Light Smoker    Types: Cigarettes   Smokeless tobacco: Never   Tobacco comments:    smokes few cigs each year  Vaping Use   Vaping Use: Never  used  Substance Use Topics   Alcohol use: Yes    Comment: occasionally   Drug use: No   Current Outpatient Medications  Medication Sig Dispense Refill   albuterol (PROVENTIL HFA;VENTOLIN HFA) 108 (90 BASE) MCG/ACT inhaler Inhale 2 puffs into the lungs every 6 (six) hours as needed for wheezing.      aspirin EC 81 MG tablet Take 81 mg by mouth daily. Swallow whole.     atorvastatin (LIPITOR) 10 MG tablet Take 10 mg by mouth daily.     Brimonidine Tartrate (LUMIFY) 0.025 % SOLN Place 1 drop into both eyes daily as needed (redness).     cetirizine (ZYRTEC) 10 MG tablet Take 10 mg by mouth daily.     clobetasol ointment (TEMOVATE) 0.05 % clobetasol 0.05 % topical ointment  APPLY TO HANDS TWICE A DAY OR AS MUCH AS YOU WASH HANDS FOR 2 WEEKS     fluocinonide cream (LIDEX)  [id number] % Apply 1 application topically 2 (two) times daily as needed (outbreaks).     fluticasone (FLOVENT HFA) 110 MCG/ACT inhaler Inhale 1 puff into the lungs daily as needed (allergies).      hydrochlorothiazide (HYDRODIURIL) 25 MG tablet Take 25 mg by mouth daily.     sildenafil (REVATIO) 20 MG tablet Take 60 mg by mouth daily as needed for erectile dysfunction.     amoxicillin (AMOXIL) 500 MG tablet Take 2,000 mg by mouth See admin instructions. Take 2000 mg 1 hour prior to dental work (Patient not taking: Reported on 05/28/2021)  1   No current facility-administered medications for this visit.   Allergies  Allergen Reactions   Adhesive [Tape] Other (See Comments)    Skin bumps use paper tape   Oxycodone Itching     Review of Systems: All systems reviewed and negative except where noted in HPI.    No results found.  Physical Exam: BP 100/70 (BP Location: Left Arm, Patient Position: Sitting, Cuff Size: Normal)   Pulse 80   Ht 5' 7.5" (1.715 m) Comment: height measured without shoes  Wt 197 lb 6 oz (89.5 kg)   BMI 30.46 kg/m  Constitutional: Pleasant,well-developed, African-American male in no acute distress, accompanied by wife. HEENT: Normocephalic and atraumatic. Conjunctivae are normal. No scleral icterus. Cardiovascular: Normal rate, regular rhythm.  Pulmonary/chest: Effort normal and breath sounds normal. No wheezing, rales or rhonchi. Abdominal: Soft, nondistended, nontender. Bowel sounds active throughout. There are no masses palpable. No hepatomegaly. Extremities: no edema Neurological: Alert and oriented to person place and time. Skin: Skin is warm and dry. No rashes noted. Psychiatric: Normal mood and affect. Behavior is normal.  CBC    Component Value Date/Time   WBC 4.1 02/26/2020 1020   RBC 4.67 02/26/2020 1020   HGB 14.6 02/26/2020 1020   HCT 43.3 02/26/2020 1020   PLT 163 02/26/2020 1020   MCV 92.7 02/26/2020 1020   MCH 31.3 02/26/2020 1020   MCHC 33.7  02/26/2020 1020   RDW 13.2 02/26/2020 1020   LYMPHSABS 0.5 (L) 11/06/2010 1719   MONOABS 1.2 (H) 11/06/2010 1719   EOSABS 0.1 11/06/2010 1719   BASOSABS 0.0 11/06/2010 1719    CMP     Component Value Date/Time   NA 136 02/26/2020 1020   K 3.6 02/26/2020 1020   CL 102 02/26/2020 1020   CO2 29 02/26/2020 1020   GLUCOSE 97 02/26/2020 1020   BUN 16 02/26/2020 1020   CREATININE 1.22 02/26/2020 1020   CALCIUM 9.3 02/26/2020 1020  PROT 7.5 02/26/2020 1020   ALBUMIN 4.1 02/26/2020 1020   AST 22 02/26/2020 1020   ALT 22 02/26/2020 1020   ALKPHOS 67 02/26/2020 1020   BILITOT 1.3 (H) 02/26/2020 1020   GFRNONAA >60 02/26/2020 1020   GFRAA >60 02/26/2020 1020     ASSESSMENT AND PLAN: 70 year old male with no first-degree relatives with colon cancer, with a single second-degree relative diagnosed at a late age (grandmother, 35s).  He has had numerous colonoscopies since 1990 and he states he has never had a polyp.  By definition, the patient is average risk for colon cancer and should be receiving colonoscopies every 10 years.  He is not having any symptoms that would warrant a colonoscopy prior to that.  I recommended he return to [place] in 2026 for his next colonoscopy.  Colon cancer screening - Patient should return for average rescreening colonoscopy in 2026  Kaytlynn Kochan E. Candis Schatz, MD [facility]

## 2021-06-08 DIAGNOSIS — R490 Dysphonia: Secondary | ICD-10-CM | POA: Diagnosis not present

## 2021-07-14 DIAGNOSIS — M79641 Pain in right hand: Secondary | ICD-10-CM | POA: Diagnosis not present

## 2021-07-20 DIAGNOSIS — H1131 Conjunctival hemorrhage, right eye: Secondary | ICD-10-CM | POA: Diagnosis not present

## 2021-07-27 DIAGNOSIS — L603 Nail dystrophy: Secondary | ICD-10-CM | POA: Diagnosis not present

## 2021-07-31 DIAGNOSIS — E785 Hyperlipidemia, unspecified: Secondary | ICD-10-CM | POA: Diagnosis not present

## 2021-07-31 DIAGNOSIS — Z23 Encounter for immunization: Secondary | ICD-10-CM | POA: Diagnosis not present

## 2021-07-31 DIAGNOSIS — I10 Essential (primary) hypertension: Secondary | ICD-10-CM | POA: Diagnosis not present

## 2021-07-31 DIAGNOSIS — R200 Anesthesia of skin: Secondary | ICD-10-CM | POA: Diagnosis not present

## 2021-07-31 DIAGNOSIS — Z0000 Encounter for general adult medical examination without abnormal findings: Secondary | ICD-10-CM | POA: Diagnosis not present

## 2021-07-31 DIAGNOSIS — B351 Tinea unguium: Secondary | ICD-10-CM | POA: Diagnosis not present

## 2021-08-12 ENCOUNTER — Encounter: Payer: Self-pay | Admitting: Physical Therapy

## 2021-08-12 ENCOUNTER — Other Ambulatory Visit: Payer: Self-pay

## 2021-08-12 ENCOUNTER — Ambulatory Visit: Payer: Medicare PPO | Attending: Family Medicine | Admitting: Physical Therapy

## 2021-08-12 DIAGNOSIS — M25551 Pain in right hip: Secondary | ICD-10-CM | POA: Diagnosis not present

## 2021-08-12 DIAGNOSIS — G8929 Other chronic pain: Secondary | ICD-10-CM | POA: Insufficient documentation

## 2021-08-12 DIAGNOSIS — M545 Low back pain, unspecified: Secondary | ICD-10-CM | POA: Insufficient documentation

## 2021-08-12 DIAGNOSIS — R252 Cramp and spasm: Secondary | ICD-10-CM | POA: Diagnosis not present

## 2021-08-12 NOTE — Therapy (Signed)
[facility] @ [place], [place], [address] Phone: [phone]   Fax:  [phone]  Physical Therapy Evaluation  Patient Details  Name: DELDRICK [name] MRN: [id number] Date of Birth: 30-Sep-1951 Referring Provider (PT): Jesse Pepper, MD   Encounter Date: 08/12/2021    Past Medical History:  Diagnosis Date   Arthritis    knees   Asthma    seasonal   Eczema    GERD (gastroesophageal reflux disease)    HLD (hyperlipidemia)    HTN (hypertension)    Pulmonary emboli ([place])    post op knee scope for a few months   Sleep apnea 2004   TA (temporal arteritis) ([place])    Wears glasses     Past Surgical History:  Procedure Laterality Date   ABCESS DRAINAGE  02/22/2013   i/d-rectal   COLONOSCOPY  06/2015   No polyps, done for family history of colon polyps Next would be 2026   EYE SURGERY Left age 11   lazy eye   INGUINAL HERNIA REPAIR Right 05/25/2013   Procedure: RIGHT INGUINAL HERNIA REPAIR  WITH MESH;  Surgeon: Imogene Burn. Georgette Dover, MD;  Location: [facility];  Service: General;  Laterality: Right;   INSERTION OF MESH Right 05/25/2013   Procedure: INSERTION OF MESH;  Surgeon: Imogene Burn. Georgette Dover, MD;  Location: [facility];  Service: General;  Laterality: Right;   NASAL SEPTUM SURGERY  10/26/2003   TONSILLECTOMY  70 years old   TOTAL KNEE ARTHROPLASTY Right 10/25/2010   rt total knee   TOTAL KNEE ARTHROPLASTY Left 70/07/2020   Procedure: TOTAL KNEE ARTHROPLASTY;  Surgeon: Gaynelle Arabian, MD;  [facility];  Service: Orthopedics;  Laterality: Left;  72min   TRACHEAL SURGERY  10/26/1983   emergency trach from throat infection   UMBILICAL HERNIA REPAIR  10/26/1999   WISDOM TOOTH EXTRACTION      There were no vitals filed for this visit.   Subjective Assessment - 08/12/21 0933     Subjective Pt referred to OPPT for ongoing numbness and pins and needles in inferior buttock,  lateral leg and sometimes groin on Rt sides.  He has also low grade Rt LBP which is stiff in AM and after sitting.  He gets occassional shooting pain into the buttock and lateral thigh on Rt.  He has had PT for similar pain which helped with exercise but now not helping.    Pertinent History PSH: bil TKR 2021  PMH: HLD, HTN, sleep apnea    Limitations Sitting    How long can you sit comfortably? 30 min    Diagnostic tests none to date    Patient Stated Goals get rid of symptoms - can still play golf but takes Aleve    Currently in Pain? Yes    Pain Score 4     Pain Location Hip    Pain Orientation Right;Posterior;Lateral;Medial;Lower    Pain Descriptors / Indicators Numbness;Pins and needles;Shooting;Aching;Dull;Tightness    Pain Type Chronic pain    Pain Radiating Towards Rt groin and lateral leg/inferior buttock    Pain Onset More than a month ago    Pain Frequency Intermittent    Aggravating Factors  prolonged sitting    Pain Relieving Factors movement, change positions                [facility] PT Assessment - 08/12/21 0001       Assessment   Medical Diagnosis R20.0 (ICD-10-CM) -  Anesthesia of skin    Referring Provider (PT) Jesse Pepper, MD    Onset Date/Surgical Date --   approx 1 year ago   Hand Dominance Right    Prior Therapy yes at different facility      Precautions   Precautions None      Balance Screen   Has the patient fallen in the past 6 months No      [place] residence    Living Arrangements Spouse/significant other    Type of [place] to enter    Entrance Stairs-Number of Steps 3    [place] One level      Prior Function   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, minerals, walk      Functional Tests   Functional tests Single leg stance      Single Leg Stance   Comments difficulty balancing in SLS on Rt/Lt > 3 sec      Posture/Postural Control    Posture/Postural Control Postural limitations    Postural Limitations Rounded Shoulders;Increased thoracic kyphosis      ROM / Strength   AROM / PROM / Strength AROM;PROM;Strength      AROM   Overall AROM Comments trunk ROM WFL with stiffness Rt lumbar end range left SB    AROM Assessment Site Lumbar    Lumbar - Left Side Bend end range stiff, Rt QL stretch      PROM   Overall PROM Comments limited Lt ER 70%, Rt hip WNL      Strength   Overall Strength Comments LE strength grossly 5/5      Flexibility   Soft Tissue Assessment /Muscle Length yes   hip flexor bil Lt>Rt limited approx 50%   Hamstrings end range limited bil    Quadriceps limited Lt>Rt bil approx 50%    Piriformis limited Lt>Rt grossly 50% bil    Quadratus Lumborum limited Rt 25%      Palpation   SI assessment  increased joint play Rt SI joint    Palpation comment atrophy present Rt glut max, tender glut med, piriformis, PF on Rt, QL tender on Rt      Special Tests    Special Tests Hip Special Tests    Hip Special Tests  Anterior Hip Impingement Test;Hip Scouring;Patrick (FABER) Test      Saralyn Pilar (FABER) Test   Findings Positive    Side Right      Hip Scouring   Findings Positive    Side Right      Anterior Hip Impingement Test    Findings Positive    Side  Right      Transfers   Transfers Independent with all Transfers      Ambulation/Gait   Gait Pattern Within Functional Limits                           [facility] Adult PT Treatment/Exercise - 08/12/21 0001       Self-Care   Self-Care Other Self-Care Comments    Other Self-Care Comments  initiated HEP                     PT Education - 08/12/21 1016     Education Details Access Code: [email or web address]    Person(s) Educated Patient    Methods Explanation;Demonstration;Handout    Comprehension Verbalized understanding  PT Short Term Goals - 08/12/21 1351       PT SHORT TERM GOAL #1   Title Pt ind with  initial HEP    Time 2    Period Weeks    Status New    Target Date 08/26/21      PT SHORT TERM GOAL #2   Title Pt will report improved sensation along Rt posterior, lateral and medial hip by at least 25%    Time 4    Period Weeks    Status New    Target Date 09/09/21               PT Long Term Goals - 08/12/21 1352       PT LONG TERM GOAL #1   Title Pt will be ind with advanced HEP and understand how to progress safely    Time 8    Period Weeks    Status New    Target Date 10/07/21      PT LONG TERM GOAL #2   Title Pt will report improved sensation along Rt hip, buttock, and groin by at least 70%    Time 8    Period Weeks    Status New    Target Date 10/07/21      PT LONG TERM GOAL #3   Title Pt will demo bil LE flexibility and Rt QL length WFL to optimize pelvic and trunk mobility    Baseline signif restriction in bil quads, hip flexors, Rt QL    Time 8    Period Weeks    Status New    Target Date 10/07/21      PT LONG TERM GOAL #4   Title Pt will be able to demo SLS on Rt and Lt LE without UE support x 20 sec to demo improved closed chain stability and strength for dynamic tasks.    Time 8    Period Weeks    Status New    Target Date 10/07/21                   Plan - 08/12/21 1334     Clinical Impression Statement Pt presents to OPPT with history of chronic low grade LBP and newer onset of numbness and pins/needles in posterior/inferior buttock, upper thigh, groin, saddle region and lateral hip.  Symptoms are worse with sitting > 30 min.  Pt presents with limited trunk ROM  with Lt SB w/ tight Rt QL but otherwise grossly WNL.  Overall mobility and strength is WNL.  He has tenderness and increased tone in Rt lateral and posterior hip, proximal hamstring and external palpation of pelvic floor muscles.  He has signif limitation in Lt hip ROM for ER (contralateral side from symptoms) with Rt hip WNL.  LE flexibility is limited in hip flexors and quads.   Atrophy is present in glut max overlying Rt SI joint with increased neutral zone of Rt SI joint.  PT initiated HEP for stretching of pelvic floor with diaphragmatic breathing, and QL and piriformis stretch.  Pt will benefit from skilled PT to address findings from evaluation and reduce troublesome symptoms limiting function.    Personal Factors and Comorbidities Age;Time since onset of injury/illness/exacerbation;Comorbidity 1;Comorbidity 2;Comorbidity 3+    Comorbidities HTN, HLD, sleep apnea, chronic LBP    Examination-Activity Limitations Sit    Examination-Participation Restrictions Community Activity    Stability/Clinical Decision Making Stable/Uncomplicated    Clinical Decision Making Low    Rehab Potential Good  PT Frequency 2x / week    PT Duration 8 weeks    PT Treatment/Interventions ADLs/Self Care Home Management;Aquatic Therapy;Cryotherapy;Moist Heat;Iontophoresis 4mg /ml Dexamethasone;Electrical Stimulation;Neuromuscular re-education;Balance training;Therapeutic exercise;Therapeutic activities;Functional mobility training;Patient/family education;Manual techniques;Dry needling;Passive range of motion;Taping;Spinal Manipulations;Joint Manipulations    PT Next Visit Plan f/u on HEP, give quad and hip flexor stretch, seated ham stretch, DN/STM to Rt glut med/QL/piriformis, proximal hamstring, (consider referral to pelvic floor PT if saddle region numbness continues w/ current plan), SLS balance, core strength    PT Home Exercise Plan Access Code: [email or web address]    Consulted and Agree with Plan of Care Patient             Patient will benefit from skilled therapeutic intervention in order to improve the following deficits and impairments:  Decreased range of motion, Impaired sensation, Postural dysfunction, Decreased balance, Impaired flexibility, Hypomobility, Pain, Increased muscle spasms  Visit Diagnosis: Cramp and spasm - Plan: PT plan of care cert/re-cert  Pain in right hip - Plan:  PT plan of care cert/re-cert  Chronic right-sided low back pain without sciatica - Plan: PT plan of care cert/re-cert     Problem List Patient Active Problem List   Diagnosis Date Noted   OA (osteoarthritis) of knee 03/03/2020   Murmur, cardiac 05/08/2014   RBBB 05/08/2014   Right inguinal hernia 03/06/2013   Abscess of right buttock 11/23/2012    Baruch Merl, PT 08/12/21 1:58 PM   [facility] @ [place], [place], [address] Phone: [phone]   Fax:  [phone]  Name: Jesse [name] MRN: [id number] Date of Birth: 21-Dec-1950

## 2021-08-12 NOTE — Patient Instructions (Signed)
Access Code: [email or web address] URL: [email or web address] Date: 08/12/2021 Prepared by: Venetia Night Smokey Melott  Exercises Seated Sidebending Arms Overhead - 1 x daily - 7 x weekly - 1 sets - 2 reps - 20 hold Quadruped Rocking Slow - 1 x daily - 7 x weekly - 1 sets - 15 reps Supine Double Knee to Chest - 1 x daily - 7 x weekly - 1 sets - 60 reps Supine Piriformis Stretch with Foot on Ground - 1 x daily - 7 x weekly - 1 sets - 2 reps - 20 hold

## 2021-08-18 ENCOUNTER — Ambulatory Visit: Payer: Medicare PPO | Admitting: Physical Therapy

## 2021-08-18 ENCOUNTER — Other Ambulatory Visit: Payer: Self-pay

## 2021-08-18 DIAGNOSIS — G8929 Other chronic pain: Secondary | ICD-10-CM

## 2021-08-18 DIAGNOSIS — M25551 Pain in right hip: Secondary | ICD-10-CM

## 2021-08-18 DIAGNOSIS — M545 Low back pain, unspecified: Secondary | ICD-10-CM

## 2021-08-18 DIAGNOSIS — R252 Cramp and spasm: Secondary | ICD-10-CM | POA: Diagnosis not present

## 2021-08-18 NOTE — Therapy (Signed)
[facility] @ [place], [place], [address] Phone: [phone]   Fax:  [phone]  Physical Therapy Treatment  Patient Details  Name: Jesse [name] MRN: [id number] Date of Birth: 06-Apr-1951 Referring Provider (PT): London Pepper, MD   Encounter Date: 08/18/2021   PT End of Session - 08/18/21 1701     Visit Number 2    Date for PT Re-Evaluation 10/07/21    Authorization Type [employer]    Authorization Time Period requesting 16 visits 2x/week x 8 weeks    Progress Note Due on Visit 10    PT Start Time 0940   late   PT Stop Time 1015    PT Time Calculation (min) 35 min    Activity Tolerance Patient tolerated treatment well             Past Medical History:  Diagnosis Date   Arthritis    knees   Asthma    seasonal   Eczema    GERD (gastroesophageal reflux disease)    HLD (hyperlipidemia)    HTN (hypertension)    Pulmonary emboli ([facility])    post op knee scope for a few months   Sleep apnea 2004   TA (temporal arteritis) ([facility])    Wears glasses     Past Surgical History:  Procedure Laterality Date   ABCESS DRAINAGE  02/22/2013   i/d-rectal   COLONOSCOPY  06/2015   No polyps, done for family history of colon polyps Next would be 2026   EYE SURGERY Left age 6   lazy eye   INGUINAL HERNIA REPAIR Right 05/25/2013   Procedure: RIGHT INGUINAL HERNIA REPAIR  WITH MESH;  Surgeon: Imogene Burn. Georgette Dover, MD;  Location: [facility];  Service: General;  Laterality: Right;   INSERTION OF MESH Right 05/25/2013   Procedure: INSERTION OF MESH;  Surgeon: Imogene Burn. Georgette Dover, MD;  Location: [facility];  Service: General;  Laterality: Right;   NASAL SEPTUM SURGERY  10/26/2003   TONSILLECTOMY  70 years old   TOTAL KNEE ARTHROPLASTY Right 10/25/2010   rt total knee   TOTAL KNEE ARTHROPLASTY Left 03/03/2020   Procedure: TOTAL KNEE ARTHROPLASTY;  Surgeon: Gaynelle Arabian, MD;  Location: [facility];  Service: Orthopedics;  Laterality: Left;  62min   TRACHEAL SURGERY  10/26/1983   emergency trach from throat infection   UMBILICAL HERNIA REPAIR  10/26/1999   WISDOM TOOTH EXTRACTION      There were no vitals filed for this visit.   Subjective Assessment - 08/18/21 0941     Subjective Sore in the AM for 10 min but then gets better.  Right side tightness.  N/T constant but varies in intensity. Felt better after doing ex's.    Pertinent History PSH: bil TKR 2021  PMH: HLD, HTN, sleep apnea    Currently in Pain? Yes    Pain Score 3     Pain Location Hip    Pain Orientation Right                               [facility] Adult PT Treatment/Exercise - 08/18/21 0001       Lumbar Exercises: Stretches   Double Knee to Chest Stretch 2 reps;30 seconds    Piriformis Stretch Right;Left;2 reps;30 seconds    Piriformis Stretch Limitations supine    Other Lumbar Stretch Exercise seaetd overhead UE stretch review from  HEP      Lumbar Exercises: Machines for Strengthening   Other Lumbar Machine Exercise standing lat bar pulls downs 40# 15x      Lumbar Exercises: Standing   Other Standing Lumbar Exercises doorway psoas stretch 3 rounds: rock thru doorway, add UE slide up doorframe; reach up and over 5x each right/left      Lumbar Exercises: Seated   Other Seated Lumbar Exercises thoracic/lumbar extension over ball 2x 8      Lumbar Exercises: Sidelying   Other Sidelying Lumbar Exercises open books 8x right/left      Lumbar Exercises: Quadruped   Other Quadruped Lumbar Exercises rocking (review of initial HEP)                     PT Education - 08/18/21 1700     Education Details open books; seated extension with ball; doorway psoas stretch    Person(s) Educated Patient    Methods Explanation;Demonstration;Handout    Comprehension Returned demonstration;Verbalized understanding              PT Short Term Goals - 08/12/21 1351       PT SHORT  TERM GOAL #1   Title Pt ind with initial HEP    Time 2    Period Weeks    Status New    Target Date 08/26/21      PT SHORT TERM GOAL #2   Title Pt will report improved sensation along Rt posterior, lateral and medial hip by at least 25%    Time 4    Period Weeks    Status New    Target Date 09/09/21               PT Long Term Goals - 08/12/21 1352       PT LONG TERM GOAL #1   Title Pt will be ind with advanced HEP and understand how to progress safely    Time 8    Period Weeks    Status New    Target Date 10/07/21      PT LONG TERM GOAL #2   Title Pt will report improved sensation along Rt hip, buttock, and groin by at least 70%    Time 8    Period Weeks    Status New    Target Date 10/07/21      PT LONG TERM GOAL #3   Title Pt will demo bil LE flexibility and Rt QL length WFL to optimize pelvic and trunk mobility    Baseline signif restriction in bil quads, hip flexors, Rt QL    Time 8    Period Weeks    Status New    Target Date 10/07/21      PT LONG TERM GOAL #4   Title Pt will be able to demo SLS on Rt and Lt LE without UE support x 20 sec to demo improved closed chain stability and strength for dynamic tasks.    Time 8    Period Weeks    Status New    Target Date 10/07/21                   Plan - 08/18/21 0944     Clinical Impression Statement The patient demonstrates good carryover with initial HEP and reports generally feeling better after doing them although low intensity numbness/tingling persist.  Additional thoraco-lumbar and hip mobility ex's added with improved muscle lengthening noted after performance.  Therapist monitoring response and providing  verbal and tactile cues for optimal benefit with ex.  He reports post session "feeling looser" and minimal paresthesia.    Personal Factors and Comorbidities Age;Time since onset of injury/illness/exacerbation;Comorbidity 1;Comorbidity 2;Comorbidity 3+    Comorbidities HTN, HLD, sleep apnea,  chronic LBP    PT Next Visit Plan f/u on HEP, seated ham stretch, DN/STM to Rt glut med/QL/piriformis, proximal hamstring, (consider referral to pelvic floor PT if saddle region numbness continues w/ current plan), SLS balance, core strength    PT Home Exercise Plan Access Code: [email or web address]             Patient will benefit from skilled therapeutic intervention in order to improve the following deficits and impairments:  Decreased range of motion, Impaired sensation, Postural dysfunction, Decreased balance, Impaired flexibility, Hypomobility, Pain, Increased muscle spasms  Visit Diagnosis: Cramp and spasm  Pain in right hip  Chronic right-sided low back pain without sciatica     Problem List Patient Active Problem List   Diagnosis Date Noted   OA (osteoarthritis) of knee 03/03/2020   Murmur, cardiac 05/08/2014   RBBB 05/08/2014   Right inguinal hernia 03/06/2013   Abscess of right buttock 11/23/2012   Ruben Im, PT 08/18/21 5:08 PM Phone: [phone] Fax: [phone]   Alvera Singh, PT 08/18/2021, 5:07 PM  [facility] @ [place], [place], [address] Phone: [phone]   Fax:  [phone]  Name: Jesse [name] MRN: [id number] Date of Birth: 1951/04/18

## 2021-08-18 NOTE — Patient Instructions (Signed)
Access Code: [email or web address] URL: [email or web address] Date: 08/18/2021 Prepared by: Ruben Im  Exercises Seated Sidebending Arms Overhead - 1 x daily - 7 x weekly - 1 sets - 2 reps - 20 hold Quadruped Rocking Slow - 1 x daily - 7 x weekly - 1 sets - 15 reps Supine Double Knee to Chest - 1 x daily - 7 x weekly - 1 sets - 60 reps Supine Piriformis Stretch with Foot on Ground - 1 x daily - 7 x weekly - 1 sets - 2 reps - 20 hold Sidelying Open Book Thoracic Rotation with Knee on Foam Roll - 1 x daily - 7 x weekly - 1 sets - 10 reps Seated Thoracic Lumbar Extension with Pectoralis Stretch - 1 x daily - 7 x weekly - 1 sets - 10 reps doorway hip flexor stretch - 1 x daily - 7 x weekly - 3 sets - 5 reps

## 2021-08-26 ENCOUNTER — Encounter: Payer: Self-pay | Admitting: Physical Therapy

## 2021-08-26 ENCOUNTER — Ambulatory Visit: Payer: Medicare PPO | Attending: Family Medicine | Admitting: Physical Therapy

## 2021-08-26 ENCOUNTER — Other Ambulatory Visit: Payer: Self-pay

## 2021-08-26 DIAGNOSIS — M25551 Pain in right hip: Secondary | ICD-10-CM | POA: Insufficient documentation

## 2021-08-26 DIAGNOSIS — M545 Low back pain, unspecified: Secondary | ICD-10-CM | POA: Diagnosis not present

## 2021-08-26 DIAGNOSIS — R252 Cramp and spasm: Secondary | ICD-10-CM | POA: Diagnosis not present

## 2021-08-26 DIAGNOSIS — G8929 Other chronic pain: Secondary | ICD-10-CM | POA: Diagnosis not present

## 2021-08-26 NOTE — Patient Instructions (Signed)
Access Code: [email or web address] URL: [email or web address] Date: 08/26/2021 Prepared by: Venetia Night Jaila Schellhorn  Exercises Seated Sidebending Arms Overhead - 1 x daily - 7 x weekly - 1 sets - 2 reps - 20 hold Quadruped Rocking Slow - 1 x daily - 7 x weekly - 1 sets - 15 reps Supine Double Knee to Chest - 1 x daily - 7 x weekly - 1 sets - 60 reps Supine Piriformis Stretch with Foot on Ground - 1 x daily - 7 x weekly - 1 sets - 2 reps - 20 hold Sidelying Open Book Thoracic Rotation with Knee on Foam Roll - 1 x daily - 7 x weekly - 1 sets - 10 reps Seated Thoracic Lumbar Extension with Pectoralis Stretch - 1 x daily - 7 x weekly - 1 sets - 10 reps doorway hip flexor stretch - 1 x daily - 7 x weekly - 3 sets - 5 reps Clamshell with Resistance - 1 x daily - 7 x weekly - 2 sets - 10 reps

## 2021-08-26 NOTE — Therapy (Signed)
[facility] @ [place], [place], [address] Phone: [phone]   Fax:  [phone]  Physical Therapy Treatment  Patient Details  Name: Jesse [name] MRN: [id number] Date of Birth: 21-Oct-1951 Referring Provider (PT): London Pepper, MD   Encounter Date: 08/26/2021   PT End of Session - 08/26/21 1022     Visit Number 3    Date for PT Re-Evaluation 10/07/21    Authorization Type [employer]    Authorization Time Period 16 visits approved 08/12/21 through 10/06/21    Authorization - Visit Number 3    Authorization - Number of Visits 16    Progress Note Due on Visit 10    PT Start Time 0623    PT Stop Time 1015    PT Time Calculation (min) 44 min    Activity Tolerance Patient tolerated treatment well    Behavior During Therapy [facility] for tasks assessed/performed             Past Medical History:  Diagnosis Date   Arthritis    knees   Asthma    seasonal   Eczema    GERD (gastroesophageal reflux disease)    HLD (hyperlipidemia)    HTN (hypertension)    Pulmonary emboli ([facility])    post op knee scope for a few months   Sleep apnea 2004   TA (temporal arteritis) ([facility])    Wears glasses     Past Surgical History:  Procedure Laterality Date   ABCESS DRAINAGE  02/22/2013   i/d-rectal   COLONOSCOPY  06/2015   No polyps, done for family history of colon polyps Next would be 2026   EYE SURGERY Left age 89   lazy eye   INGUINAL HERNIA REPAIR Right 05/25/2013   Procedure: RIGHT INGUINAL HERNIA REPAIR  WITH MESH;  Surgeon: Imogene Burn. Georgette Dover, MD;  Location: [facility];  Service: General;  Laterality: Right;   INSERTION OF MESH Right 05/25/2013   Procedure: INSERTION OF MESH;  Surgeon: Imogene Burn. Georgette Dover, MD;  Location: [facility];  Service: General;  Laterality: Right;   NASAL SEPTUM SURGERY  10/26/2003   TONSILLECTOMY  70 years old   TOTAL KNEE ARTHROPLASTY Right 10/25/2010   rt  total knee   TOTAL KNEE ARTHROPLASTY Left 03/03/2020   Procedure: TOTAL KNEE ARTHROPLASTY;  Surgeon: Gaynelle Arabian, MD;  [facility];  Service: Orthopedics;  Laterality: Left;  35min   TRACHEAL SURGERY  10/26/1983   emergency trach from throat infection   UMBILICAL HERNIA REPAIR  10/26/1999   WISDOM TOOTH EXTRACTION      There were no vitals filed for this visit.   Subjective Assessment - 08/26/21 0931     Subjective the numbness/tingling seems to be improving.  I get hamstring cramps when I go into position for fig 4 stretch.  Some mornings I have a bad deep ache on waking in Rt buttock but it resolves within 5 min of rising.    Pertinent History PSH: bil TKR 2021  PMH: HLD, HTN, sleep apnea    Limitations Sitting    How long can you sit comfortably? 30 min    Diagnostic tests none to date    Patient Stated Goals get rid of symptoms - can still play golf but takes Aleve    Currently in Pain? Yes    Pain Score 2    was 6/10 on rising in AM   Pain Location Buttocks  Pain Orientation Right;Posterior    Pain Descriptors / Indicators Pins and needles;Numbness;Aching;Dull    Pain Type Chronic pain    Pain Onset More than a month ago    Pain Frequency Intermittent    Aggravating Factors  prolonged sitting and in AM on waking x 5'    Pain Relieving Factors movement, change positions                               [facility] Adult PT Treatment/Exercise - 08/26/21 0001       Exercises   Exercises Lumbar;Knee/Hip;Shoulder      Lumbar Exercises: Stretches   Active Hamstring Stretch Left;Right;1 rep;30 seconds    Active Hamstring Stretch Limitations supine    Single Knee to Chest Stretch Left;Right;1 rep;20 seconds    Piriformis Stretch Left;Right;1 rep;30 seconds    Other Lumbar Stretch Exercise t/l ext seated with small ball at t/l junction x 15 reps, added hands behind head open elbows with trunk ext      Lumbar Exercises: Quadruped   Straight Leg Raises  Limitations modified leg only bird dog leg ext on elbows x 3 each LE, last rep added opp arm raise, big challenge with this    Other Quadruped Lumbar Exercises rocking for pelvic floor opening with exhale x 10 reps      Knee/Hip Exercises: Stretches   Hip Flexor Stretch Right;Left    Hip Flexor Stretch Limitations doorway rocking, then add arm slide and reach up/over x 2' in total to work into Rt side, greater ease on Lt      Knee/Hip Exercises: Standing   Rebounder warm up weight shift marches 3-way x 1' each with light UE support      Knee/Hip Exercises: Sidelying   Clams yellow loop x 15 reps each side, Pt TC for TA indraw and PT VC exhale on effort      Shoulder Exercises: Power Futures trader lat pulldown 40lb x 15 reps, standing in mini squat vertical trunk                     PT Education - 08/26/21 1021     Education Details added SL clam with blue tied loop band with TA indraw and exhale on effort focus    Person(s) Educated Patient    Methods Explanation;Demonstration;Handout;Verbal cues;Tactile cues    Comprehension Verbalized understanding;Returned demonstration              PT Short Term Goals - 08/26/21 1027       PT SHORT TERM GOAL #1   Title Pt ind with initial HEP    Status Achieved      PT SHORT TERM GOAL #2   Title Pt will report improved sensation along Rt posterior, lateral and medial hip by at least 25%    Status Achieved               PT Long Term Goals - 08/26/21 1028       PT LONG TERM GOAL #1   Title Pt will be ind with advanced HEP and understand how to progress safely    Status On-going      PT LONG TERM GOAL #2   Title Pt will report improved sensation along Rt hip, buttock, and groin by at least 70%    Status New      PT LONG TERM GOAL #3  Title Pt will demo bil LE flexibility and Rt QL length WFL to optimize pelvic and trunk mobility    Status On-going                   Plan -  08/26/21 1023     Clinical Impression Statement Pt making excellent progress in mobility of hips and spine.  He already notes reduced if not at times eliminated numbness and tingling in Rt buttock/saddle region.  He needed some adjustments to sequencing for LE supine stretches to help him avoid hamstring cramping, with SKTC followed by hamstring stretch followed by piriformis stretch helping him to avoid cramping.  He also benefits from contralteral cramping by keeping other leg straight vs bent.  He reports a 5' ache in Rt buttock on waking so PT encouraged him to trial some supine stretches before rising to see if this helps.  Pt had difficulty with bird dog intro today so performed LE only from quadruped on elbows with few reps, adding UE raise for last rep.  Pt needed cueing for exhale on effort coord with SL clam today which was added to HEP with blue tied band.  Continue along POC.    Comorbidities HTN, HLD, sleep apnea, chronic LBP    PT Frequency 2x / week    PT Duration 8 weeks    PT Treatment/Interventions ADLs/Self Care Home Management;Aquatic Therapy;Cryotherapy;Moist Heat;Iontophoresis 4mg /ml Dexamethasone;Electrical Stimulation;Neuromuscular re-education;Balance training;Therapeutic exercise;Therapeutic activities;Functional mobility training;Patient/family education;Manual techniques;Dry needling;Passive range of motion;Taping;Spinal Manipulations;Joint Manipulations    PT Next Visit Plan LTG check on % reduction of n/t, rebounder warm up 3-way march, intro lateral step up and counter SLS for LTG for balance and closed chain hip stability, continue spine and hip mobility/flexibility, progression of core stabilization, manual therapy as needed to release Rt buttock/hip    PT Home Exercise Plan Access Code: [email or web address]    Consulted and Agree with Plan of Care Patient             Patient will benefit from skilled therapeutic intervention in order to improve the following deficits and  impairments:     Visit Diagnosis: Pain in right hip  Chronic right-sided low back pain without sciatica  Cramp and spasm     Problem List Patient Active Problem List   Diagnosis Date Noted   OA (osteoarthritis) of knee 03/03/2020   Murmur, cardiac 05/08/2014   RBBB 05/08/2014   Right inguinal hernia 03/06/2013   Abscess of right buttock 11/23/2012    Baruch Merl, PT 08/26/21 10:31 AM   [facility] @ [place], [place], [address] Phone: [phone]   Fax:  [phone]  Name: Jesse [name] MRN: [id number] Date of Birth: [birth year]

## 2021-08-27 ENCOUNTER — Ambulatory Visit (INDEPENDENT_AMBULATORY_CARE_PROVIDER_SITE_OTHER): Payer: Medicare PPO

## 2021-08-27 ENCOUNTER — Ambulatory Visit: Payer: Medicare Other | Admitting: Sports Medicine

## 2021-08-27 ENCOUNTER — Encounter: Payer: Self-pay | Admitting: Sports Medicine

## 2021-08-27 DIAGNOSIS — M79675 Pain in left toe(s): Secondary | ICD-10-CM

## 2021-08-27 DIAGNOSIS — B351 Tinea unguium: Secondary | ICD-10-CM

## 2021-08-27 DIAGNOSIS — M722 Plantar fascial fibromatosis: Secondary | ICD-10-CM | POA: Diagnosis not present

## 2021-08-27 DIAGNOSIS — M79671 Pain in right foot: Secondary | ICD-10-CM

## 2021-08-27 DIAGNOSIS — M79674 Pain in right toe(s): Secondary | ICD-10-CM | POA: Diagnosis not present

## 2021-08-27 NOTE — Progress Notes (Addendum)
Subjective: Jesse [name] is a 70 y.o. male patient presents to office with complaint of 1. Pain to left heel for about 1 year.  Reports that the pain has worsened he has tried over-the-counter cushions, good supportive shoes, and exercise/stretching but still has pain pain is only when he is walking barefooted states that he has no pain when he is in shoes.  2.  Discoloration and fungus in the nails dates that he has tried topical laser and has seen 4 doctors in the past with no improvement in his symptoms. 3.  Patient reports passively that he also still occasionally gets awkward sensation to feet.   Patient Active Problem List   Diagnosis Date Noted   OA (osteoarthritis) of knee 03/03/2020   Murmur, cardiac 05/08/2014   RBBB 05/08/2014   Right inguinal hernia 03/06/2013   Abscess of right buttock 11/23/2012    Current Outpatient Medications on File Prior to Visit  Medication Sig Dispense Refill   albuterol (PROVENTIL HFA;VENTOLIN HFA) 108 (90 BASE) MCG/ACT inhaler Inhale 2 puffs into the lungs every 6 (six) hours as needed for wheezing.      amoxicillin (AMOXIL) 500 MG tablet Take 2,000 mg by mouth See admin instructions. Take 2000 mg 1 hour prior to dental work (Patient not taking: Reported on 05/28/2021)  1   aspirin EC 81 MG tablet Take 81 mg by mouth daily. Swallow whole.     atorvastatin (LIPITOR) 10 MG tablet Take 10 mg by mouth daily.     Brimonidine Tartrate (LUMIFY) 0.025 % SOLN Place 1 drop into both eyes daily as needed (redness).     cetirizine (ZYRTEC) 10 MG tablet Take 10 mg by mouth daily.     clobetasol ointment (TEMOVATE) 0.05 % clobetasol 0.05 % topical ointment  APPLY TO HANDS TWICE A DAY OR AS MUCH AS YOU WASH HANDS FOR 2 WEEKS     fluocinonide cream (LIDEX) 4.09 % Apply 1 application topically 2 (two) times daily as needed (outbreaks).     fluticasone (FLOVENT HFA) 110 MCG/ACT inhaler Inhale 1 puff into the lungs daily as needed (allergies).      hydrochlorothiazide  (HYDRODIURIL) 25 MG tablet Take 25 mg by mouth daily.     sildenafil (REVATIO) 20 MG tablet Take 60 mg by mouth daily as needed for erectile dysfunction.     No current facility-administered medications on file prior to visit.    Allergies  Allergen Reactions   Adhesive [Tape] Other (See Comments)    Skin bumps use paper tape   Oxycodone Itching    Objective: Physical Exam General: The patient is alert and oriented x3 in no acute distress.  Dermatology: Skin is warm, dry and supple bilateral lower extremities. Nails 1-10 are minimally thickened with discoloration/hyperpigmentation to multiple nails worse at bilateral hallux.  There is no erythema, edema, no eccymosis, no open lesions present. Integument is otherwise unremarkable.  Vascular: Dorsalis Pedis pulse and Posterior Tibial pulse are 2/4 bilateral. Capillary fill time is immediate to all digits.  Neurological: Grossly intact to light touch bilateral.  Subjective awkward sensation to the balls of both feet worse on the left.  Musculoskeletal: Minimal tenderness to palpation at the medial calcaneal tubercale and through the insertion of the plantar fascia on the left foot. There is no pain to palpation to the plantar fascial insertion on the right.  There is limited ankle joint range of motion bilateral.  Pes planus foot type.  Xray, Right/Left foot:  Normal osseous mineralization. Joint  spaces preserved. No fracture/dislocation/boney destruction. Calcaneal spur present with mild thickening of plantar fascia on the left.  Midtarsal breech supportive of pes planus deformity.  No other soft tissue abnormalities or radiopaque foreign bodies.   Assessment and Plan: Problem List Items Addressed This Visit   None Visit Diagnoses     Plantar fasciitis    -  Primary   Relevant Orders   DG Foot Complete Right   DG Foot Complete Left   Heel pain, bilateral       Nail fungus       Toe pain, bilateral           -Complete  examination performed.  -Xrays reviewed -Discussed with patient in detail the condition of plantar fasciitis, how this occurs and general treatment options. Explained both conservative and surgical treatments.  -Recommended good supportive shoes and advised use of left plantar fascial braces which was dispensed at today's visit. -Explained and dispensed to patient daily stretching exercises. -Recommend patient to ice affected area 1-2x daily. -Advised patient that we will monitor the awkward sensation at the ball of the feet and if it worsens to let me know for [place] to further assist -Discussed treatment options for concern for residual nail fungus -Fungal culture was obtained by removing a portion of the hard nail itself from each of the involved toenails using a sterile nail nipper and sent to [facility] lab. Patient tolerated the biopsy procedure well without discomfort or need for anesthesia.  -Patient to return to office in 4 weeks for follow up or sooner if problems or questions arise.  Landis Martins, DPM

## 2021-08-28 ENCOUNTER — Ambulatory Visit: Payer: Medicare PPO | Admitting: Physical Therapy

## 2021-08-28 ENCOUNTER — Other Ambulatory Visit: Payer: Self-pay

## 2021-08-28 DIAGNOSIS — R252 Cramp and spasm: Secondary | ICD-10-CM

## 2021-08-28 DIAGNOSIS — M25551 Pain in right hip: Secondary | ICD-10-CM

## 2021-08-28 DIAGNOSIS — M545 Low back pain, unspecified: Secondary | ICD-10-CM | POA: Diagnosis not present

## 2021-08-28 DIAGNOSIS — G8929 Other chronic pain: Secondary | ICD-10-CM | POA: Diagnosis not present

## 2021-08-28 NOTE — Therapy (Signed)
[facility] @ [place] [place], [place], [address] Phone: [phone]   Fax:  [phone]  Physical Therapy Treatment  Patient Details  Name: Jesse [name] MRN: [id number] Date of Birth: 1951/06/04 Referring Provider (PT): London Pepper, MD   Encounter Date: 08/28/2021   PT End of Session - 08/28/21 1037     Visit Number 4    Date for PT Re-Evaluation 10/07/21    Authorization Time Period 16 visits approved 08/12/21 through 10/06/21    Authorization - Visit Number 4    Authorization - Number of Visits 16    Progress Note Due on Visit 10    PT Start Time 0845    PT Stop Time 0925    PT Time Calculation (min) 40 min    Activity Tolerance Patient tolerated treatment well             Past Medical History:  Diagnosis Date   Arthritis    knees   Asthma    seasonal   Eczema    GERD (gastroesophageal reflux disease)    HLD (hyperlipidemia)    HTN (hypertension)    Pulmonary emboli ([place])    post op knee scope for a few months   Sleep apnea 2004   TA (temporal arteritis) ([place])    Wears glasses     Past Surgical History:  Procedure Laterality Date   ABCESS DRAINAGE  02/22/2013   i/d-rectal   COLONOSCOPY  06/2015   No polyps, done for family history of colon polyps Next would be 2026   EYE SURGERY Left age 70   lazy eye   INGUINAL HERNIA REPAIR Right 05/25/2013   Procedure: RIGHT INGUINAL HERNIA REPAIR  WITH MESH;  Surgeon: Imogene Burn. Georgette Dover, MD;  Location: [facility];  Service: General;  Laterality: Right;   INSERTION OF MESH Right 05/25/2013   Procedure: INSERTION OF MESH;  Surgeon: Imogene Burn. Georgette Dover, MD;  Location: [facility];  Service: General;  Laterality: Right;   NASAL SEPTUM SURGERY  10/26/2003   TONSILLECTOMY  70 years old   TOTAL KNEE ARTHROPLASTY Right 10/25/2010   rt total knee   TOTAL KNEE ARTHROPLASTY Left 03/03/2020   Procedure: TOTAL KNEE ARTHROPLASTY;   Surgeon: Gaynelle Arabian, MD;  Location: [facility];  Service: Orthopedics;  Laterality: Left;  22min   TRACHEAL SURGERY  10/26/1983   emergency trach from throat infection   UMBILICAL HERNIA REPAIR  10/26/1999   WISDOM TOOTH EXTRACTION      There were no vitals filed for this visit.   Subjective Assessment - 08/28/21 0851     Subjective Reports plantar fascitis left foot.  I was moving a lot yesterday and right lateral hip is uncomfortable today.    Pertinent History PSH: bil TKR 2021  PMH: HLD, HTN, sleep apnea    Patient Stated Goals get rid of symptoms - can still play golf but takes Aleve    Currently in Pain? Yes    Pain Score 6     Pain Location Hip    Pain Orientation Right;Lateral    Pain Type Chronic pain                               [facility] Adult PT Treatment/Exercise - 08/28/21 0001       Lumbar Exercises: Aerobic   Nustep L1 4 min while discussing status  Lumbar Exercises: Standing   Other Standing Lumbar Exercises hip hinge with golf club for 3 points of contact 15x      Lumbar Exercises: Quadruped   Other Quadruped Lumbar Exercises bird dogs opp UE/LE on forearms 5x, 3 reps on  hands      Knee/Hip Exercises: Stretches   Hip Flexor Stretch Right;Left    Hip Flexor Stretch Limitations doorway rocking, then add arm slide and reach up/over x 2' in total to work into Rt side, greater ease on Lt      Knee/Hip Exercises: Standing   Other Standing Knee Exercises same side UE and hip/knee flexion 10x right/left for glute activation, light UE support needed   mirror feedback     Knee/Hip Exercises: Sidelying   Clams blue band 10x right/left                     PT Education - 08/28/21 1036     Education Details bird dogs on elbows, glute standing hip flexion, hip hinge with golf club    Person(s) Educated Patient    Methods Explanation;Demonstration;Handout    Comprehension Returned demonstration;Verbalized understanding               PT Short Term Goals - 08/26/21 1027       PT SHORT TERM GOAL #1   Title Pt ind with initial HEP    Status Achieved      PT SHORT TERM GOAL #2   Title Pt will report improved sensation along Rt posterior, lateral and medial hip by at least 25%    Status Achieved               PT Long Term Goals - 08/26/21 1028       PT LONG TERM GOAL #1   Title Pt will be ind with advanced HEP and understand how to progress safely    Status On-going      PT LONG TERM GOAL #2   Title Pt will report improved sensation along Rt hip, buttock, and groin by at least 70%    Status New      PT LONG TERM GOAL #3   Title Pt will demo bil LE flexibility and Rt QL length WFL to optimize pelvic and trunk mobility    Status On-going                   Plan - 08/28/21 1039     Clinical Impression Statement The patient reports he feels much better than he did upon arrival today.  Good response to exercise with decreased stiffness noted in LEs.  No cramping today.  Difficulty coordinating movement with bird dogs but reports the ex feels good to perform.  Right hip/pelvic drop noted with single leg standing.  Introduced hip hinging using golf club for feedback.  Therapist monitoring ex technique and response throughout treatment session.    Personal Factors and Comorbidities Age;Time since onset of injury/illness/exacerbation;Comorbidity 1;Comorbidity 2;Comorbidity 3+    Comorbidities HTN, HLD, sleep apnea, chronic LBP    Rehab Potential Good    PT Frequency 2x / week    PT Duration 8 weeks    PT Treatment/Interventions ADLs/Self Care Home Management;Aquatic Therapy;Cryotherapy;Moist Heat;Iontophoresis 4mg /ml Dexamethasone;Electrical Stimulation;Neuromuscular re-education;Balance training;Therapeutic exercise;Therapeutic activities;Functional mobility training;Patient/family education;Manual techniques;Dry needling;Passive range of motion;Taping;Spinal Manipulations;Joint Manipulations     PT Next Visit Plan LTG check on % reduction of n/t, hip hinge with golf club and progression to dead lift;   intro  lateral step up and counter SLS for LTG for balance and closed chain hip stability, continue spine and hip mobility/flexibility, progression of core stabilization, manual therapy as needed to release Rt buttock/hip    PT Home Exercise Plan Access Code: [place]             Patient will benefit from skilled therapeutic intervention in order to improve the following deficits and impairments:  Decreased range of motion, Impaired sensation, Postural dysfunction, Decreased balance, Impaired flexibility, Hypomobility, Pain, Increased muscle spasms  Visit Diagnosis: Pain in right hip  Chronic right-sided low back pain without sciatica  Cramp and spasm     Problem List Patient Active Problem List   Diagnosis Date Noted   OA (osteoarthritis) of knee 03/03/2020   Murmur, cardiac 05/08/2014   RBBB 05/08/2014   Right inguinal hernia 03/06/2013   Abscess of right buttock 11/23/2012   Ruben Im, PT 08/28/21 10:50 AM Phone: [phone] Fax: [phone]  Alvera Singh, PT 08/28/2021, 10:50 AM  [facility] @ [address] [place], [place], [address] Phone: [phone]   Fax:  [phone]  Name: KORT STETTLER MRN: [id number] Date of Birth: 07/05/1951

## 2021-08-28 NOTE — Patient Instructions (Signed)
Access Code: [email or web address] URL: [email or web address] Date: 08/28/2021 Prepared by: Ruben Im  Exercises Seated Sidebending Arms Overhead - 1 x daily - 7 x weekly - 1 sets - 2 reps - 20 hold Quadruped Rocking Slow - 1 x daily - 7 x weekly - 1 sets - 15 reps Supine Double Knee to Chest - 1 x daily - 7 x weekly - 1 sets - 60 reps Supine Piriformis Stretch with Foot on Ground - 1 x daily - 7 x weekly - 1 sets - 2 reps - 20 hold Sidelying Open Book Thoracic Rotation with Knee on Foam Roll - 1 x daily - 7 x weekly - 1 sets - 10 reps Seated Thoracic Lumbar Extension with Pectoralis Stretch - 1 x daily - 7 x weekly - 1 sets - 10 reps doorway hip flexor stretch - 1 x daily - 7 x weekly - 3 sets - 5 reps Clamshell with Resistance - 1 x daily - 7 x weekly - 2 sets - 10 reps Bird Dog - 1 x daily - 7 x weekly - 1 sets - 5 reps - 5 hold Standing Hip Hinge with Dowel - 1 x daily - 7 x weekly - 1 sets - 10 reps Marching with Same-Side Arm Raise and Walker - 1 x daily - 7 x weekly - 1 sets - 10 reps

## 2021-09-01 ENCOUNTER — Encounter: Payer: Medicare PPO | Admitting: Physical Therapy

## 2021-09-01 ENCOUNTER — Ambulatory Visit: Payer: Medicare PPO | Admitting: Physical Therapy

## 2021-09-01 ENCOUNTER — Other Ambulatory Visit: Payer: Self-pay

## 2021-09-01 DIAGNOSIS — M545 Low back pain, unspecified: Secondary | ICD-10-CM

## 2021-09-01 DIAGNOSIS — M25551 Pain in right hip: Secondary | ICD-10-CM

## 2021-09-01 DIAGNOSIS — R252 Cramp and spasm: Secondary | ICD-10-CM

## 2021-09-01 DIAGNOSIS — G8929 Other chronic pain: Secondary | ICD-10-CM | POA: Diagnosis not present

## 2021-09-01 NOTE — Patient Instructions (Signed)
Access Code: [email or web address] URL: [email or web address] Date: 09/01/2021 Prepared by: Ruben Im  Exercises Seated Sidebending Arms Overhead - 1 x daily - 7 x weekly - 1 sets - 2 reps - 20 hold Quadruped Rocking Slow - 1 x daily - 7 x weekly - 1 sets - 15 reps Supine Double Knee to Chest - 1 x daily - 7 x weekly - 1 sets - 60 reps Supine Piriformis Stretch with Foot on Ground - 1 x daily - 7 x weekly - 1 sets - 2 reps - 20 hold Sidelying Open Book Thoracic Rotation with Knee on Foam Roll - 1 x daily - 7 x weekly - 1 sets - 10 reps Seated Thoracic Lumbar Extension with Pectoralis Stretch - 1 x daily - 7 x weekly - 1 sets - 10 reps doorway hip flexor stretch - 1 x daily - 7 x weekly - 3 sets - 5 reps Clamshell with Resistance - 1 x daily - 7 x weekly - 2 sets - 10 reps Bird Dog - 1 x daily - 7 x weekly - 1 sets - 5 reps - 5 hold Standing Hip Hinge with Dowel - 1 x daily - 7 x weekly - 1 sets - 10 reps Marching with Same-Side Arm Raise and Walker - 1 x daily - 7 x weekly - 1 sets - 10 reps Plank on Knees - 1 x daily - 7 x weekly - 1 sets - 5 reps - 5 hold Half Deadlift with Kettlebell - 1 x daily - 7 x weekly - 1 sets - 10 reps Forward Step Down Touch with Heel - 1 x daily - 7 x weekly - 1 sets - 10 reps Marching with Same-Side Arm Raise - 1 x daily - 7 x weekly - 1 sets - 10 reps

## 2021-09-01 NOTE — Therapy (Signed)
[facility] @ [facility] [place], [place], [address] Phone: [phone]   Fax:  [phone]  Physical Therapy Treatment  Patient Details  Name: Jesse [name] MRN: [id number] Date of Birth: Oct 06, 1951 Referring Provider (PT): London Pepper, MD   Encounter Date: 09/01/2021   PT End of Session - 09/01/21 1514     Visit Number 5    Date for PT Re-Evaluation 10/07/21    Authorization Type [employer]    Authorization Time Period 16 visits approved 08/12/21 through 10/06/21    Authorization - Visit Number 5    Authorization - Number of Visits 16    Progress Note Due on Visit 10    PT Start Time 4034    PT Stop Time 1055    PT Time Calculation (min) 40 min    Activity Tolerance Patient tolerated treatment well             Past Medical History:  Diagnosis Date   Arthritis    knees   Asthma    seasonal   Eczema    GERD (gastroesophageal reflux disease)    HLD (hyperlipidemia)    HTN (hypertension)    Pulmonary emboli ([place])    post op knee scope for a few months   Sleep apnea 2004   TA (temporal arteritis) ([place])    Wears glasses     Past Surgical History:  Procedure Laterality Date   ABCESS DRAINAGE  02/22/2013   i/d-rectal   COLONOSCOPY  06/2015   No polyps, done for family history of colon polyps Next would be 2026   EYE SURGERY Left age 36   lazy eye   INGUINAL HERNIA REPAIR Right 05/25/2013   Procedure: RIGHT INGUINAL HERNIA REPAIR  WITH MESH;  Surgeon: Imogene Burn. Georgette Dover, MD;  Location: [facility];  Service: General;  Laterality: Right;   INSERTION OF MESH Right 05/25/2013   Procedure: INSERTION OF MESH;  Surgeon: Imogene Burn. Georgette Dover, MD;  Location: [facility];  Service: General;  Laterality: Right;   NASAL SEPTUM SURGERY  10/26/2003   TONSILLECTOMY  70 years old   TOTAL KNEE ARTHROPLASTY Right 10/25/2010   rt total knee   TOTAL KNEE ARTHROPLASTY Left 03/03/2020    Procedure: TOTAL KNEE ARTHROPLASTY;  Surgeon: Gaynelle Arabian, MD;  [facility];  Service: Orthopedics;  Laterality: Left;  23min   TRACHEAL SURGERY  10/26/1983   emergency trach from throat infection   UMBILICAL HERNIA REPAIR  10/26/1999   WISDOM TOOTH EXTRACTION      There were no vitals filed for this visit.   Subjective Assessment - 09/01/21 1020     Subjective Things are going well.  I'm not having the back pain that I did.   AM discomfort but less.    About to buy a new mattress.    Pertinent History PSH: bil TKR 2021  PMH: HLD, HTN, sleep apnea    Patient Stated Goals get rid of symptoms - can still play golf but takes Aleve    Currently in Pain? 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    Other Standing Lumbar Exercises hip hinge with golf club for 3 points of contact 15x    Other Standing Lumbar Exercises 2 5# and 2 10# partial dead lifts 10x each      Lumbar Exercises: Prone   Other Prone Lumbar Exercises modified planks knees down 8x 5 sec hold      Knee/Hip Exercises: Stretches   Hip Flexor Stretch Right;Left;5 reps    Hip Flexor Stretch Limitations 2nd step with UE      Knee/Hip Exercises: Standing   Step Down Right;Left;1 set;10 reps;Step Height: 4";Hand Hold: 2    Step Down Limitations regular step with green foam pod    Other Standing Knee Exercises same side UE and hip/knee flexion 10x right/left for glute activation, no UE support needed   mirror feedback                    PT Education - 09/01/21 1510     Education Details modified plank; standing hip/UE flexion; partial  dead lifts; mini step downs    Person(s) Educated Patient    Methods Explanation;Demonstration;Handout    Comprehension Returned demonstration;Verbalized understanding              PT Short Term Goals - 08/26/21 1027       PT SHORT TERM GOAL #1   Title Pt ind with initial HEP    Status Achieved      PT SHORT TERM GOAL #2   Title Pt will report improved sensation along Rt posterior, lateral and medial hip by at least 25%    Status Achieved               PT Long Term Goals - 08/26/21 1028       PT LONG TERM GOAL #1   Title Pt will be ind with advanced HEP and understand how to progress safely    Status On-going      PT LONG TERM GOAL #2   Title Pt will report improved sensation along Rt hip, buttock, and groin by at least 70%    Status New      PT LONG TERM GOAL #3   Title Pt will demo bil LE flexibility and Rt QL length WFL to optimize pelvic and trunk mobility    Status On-going                   Plan - 09/01/21 1514     Clinical Impression Statement The patient rates his overall improvement at 40-50%.  Improving single leg stability with no UE support needed today although verbal cues to avoid compensatory trunk lean.  Less pelvic drop overall but noted with carrying 20# load.  Good carryover with hip hinge technique and able to progress to partial dead lifts without exacerbation of pain.  Right LE strength asymmetry.    Comorbidities HTN, HLD, sleep apnea, chronic LBP    PT Duration 8 weeks    PT Treatment/Interventions ADLs/Self Care Home Management;Aquatic Therapy;Cryotherapy;Moist Heat;Iontophoresis 4mg /ml Dexamethasone;Electrical Stimulation;Neuromuscular re-education;Balance training;Therapeutic exercise;Therapeutic activities;Functional mobility training;Patient/family education;Manual techniques;Dry needling;Passive range of motion;Taping;Spinal Manipulations;Joint Manipulations    PT Next Visit Plan dead lifting progression; farmer carries; SLS;  step downs; modified planks;  right LE strength quads and glutes    PT  Home Exercise Plan Access Code: [email or web address]             Patient will benefit from skilled therapeutic intervention in order to improve the following deficits and impairments:  Decreased range of motion, Impaired sensation, Postural dysfunction, Decreased balance, Impaired flexibility, Hypomobility, Pain, Increased muscle spasms  Visit Diagnosis: Pain in right hip  Chronic right-sided low back pain without sciatica  Cramp and spasm     Problem List Patient Active Problem List   Diagnosis Date Noted   OA (osteoarthritis) of knee 03/03/2020   Murmur, cardiac 05/08/2014   RBBB 05/08/2014   Right inguinal hernia 03/06/2013   Abscess of right buttock 11/23/2012   Ruben Im, PT 09/01/21 3:20 PM Phone: [phone] Fax: [phone]  Alvera Singh, PT 09/01/2021, 3:20 PM  [facility] @ [place], [place], [address] Phone: [phone]   Fax:  [phone]  Name: Jesse [name] MRN: [id number] Date of Birth: Oct 18, 1951

## 2021-09-03 ENCOUNTER — Ambulatory Visit: Payer: Medicare PPO | Admitting: Physical Therapy

## 2021-09-03 ENCOUNTER — Other Ambulatory Visit: Payer: Self-pay

## 2021-09-03 ENCOUNTER — Encounter: Payer: Medicare PPO | Admitting: Physical Therapy

## 2021-09-03 DIAGNOSIS — M545 Low back pain, unspecified: Secondary | ICD-10-CM

## 2021-09-03 DIAGNOSIS — G8929 Other chronic pain: Secondary | ICD-10-CM | POA: Diagnosis not present

## 2021-09-03 DIAGNOSIS — L218 Other seborrheic dermatitis: Secondary | ICD-10-CM | POA: Diagnosis not present

## 2021-09-03 DIAGNOSIS — L80 Vitiligo: Secondary | ICD-10-CM | POA: Diagnosis not present

## 2021-09-03 DIAGNOSIS — M25551 Pain in right hip: Secondary | ICD-10-CM | POA: Diagnosis not present

## 2021-09-03 DIAGNOSIS — R252 Cramp and spasm: Secondary | ICD-10-CM | POA: Diagnosis not present

## 2021-09-03 DIAGNOSIS — L2089 Other atopic dermatitis: Secondary | ICD-10-CM | POA: Diagnosis not present

## 2021-09-03 DIAGNOSIS — L308 Other specified dermatitis: Secondary | ICD-10-CM | POA: Diagnosis not present

## 2021-09-03 NOTE — Patient Instructions (Signed)
Access Code: [email or web address] URL: [email or web address] Date: 09/03/2021 Prepared by: Ruben Im  Exercises Seated Sidebending Arms Overhead - 1 x daily - 7 x weekly - 1 sets - 2 reps - 20 hold Quadruped Rocking Slow - 1 x daily - 7 x weekly - 1 sets - 15 reps Supine Double Knee to Chest - 1 x daily - 7 x weekly - 1 sets - 60 reps Supine Piriformis Stretch with Foot on Ground - 1 x daily - 7 x weekly - 1 sets - 2 reps - 20 hold Sidelying Open Book Thoracic Rotation with Knee on Foam Roll - 1 x daily - 7 x weekly - 1 sets - 10 reps Seated Thoracic Lumbar Extension with Pectoralis Stretch - 1 x daily - 7 x weekly - 1 sets - 10 reps doorway hip flexor stretch - 1 x daily - 7 x weekly - 3 sets - 5 reps Clamshell with Resistance - 1 x daily - 7 x weekly - 2 sets - 10 reps Bird Dog - 1 x daily - 7 x weekly - 1 sets - 5 reps - 5 hold Standing Hip Hinge with Dowel - 1 x daily - 7 x weekly - 1 sets - 10 reps Marching with Same-Side Arm Raise and Walker - 1 x daily - 7 x weekly - 1 sets - 10 reps Plank on Knees - 1 x daily - 7 x weekly - 1 sets - 5 reps - 5 hold Half Deadlift with Kettlebell - 1 x daily - 7 x weekly - 1 sets - 10 reps Forward Step Down Touch with Heel - 1 x daily - 7 x weekly - 1 sets - 10 reps Marching with Same-Side Arm Raise - 1 x daily - 7 x weekly - 1 sets - 10 reps Standing Anti-Rotation Press with Anchored Resistance - 1 x daily - 7 x weekly - 5 sets - 15 reps Kettlebell Suitcase Carry (Mirrored) - 1 x daily - 7 x weekly - 1 sets - 10 reps

## 2021-09-03 NOTE — Therapy (Signed)
[facility] @ [place], [place], [address] Phone: [phone]   Fax:  [phone]  Physical Therapy Treatment  Patient Details  Name: Jesse [name] MRN: [id number] Date of Birth: Mar 01, 1951 Referring Provider (PT): London Pepper, MD   Encounter Date: 09/03/2021   PT End of Session - 09/03/21 0852     Visit Number 6    Date for PT Re-Evaluation 10/07/21    Authorization Type [employer]    Authorization Time Period 16 visits approved 08/12/21 through 10/06/21    Authorization - Visit Number 6    Authorization - Number of Visits 16    PT Start Time 0805    PT Stop Time 3474    PT Time Calculation (min) 41 min    Activity Tolerance Patient tolerated treatment well             Past Medical History:  Diagnosis Date   Arthritis    knees   Asthma    seasonal   Eczema    GERD (gastroesophageal reflux disease)    HLD (hyperlipidemia)    HTN (hypertension)    Pulmonary emboli ([place])    post op knee scope for a few months   Sleep apnea 2004   TA (temporal arteritis) ([place])    Wears glasses     Past Surgical History:  Procedure Laterality Date   ABCESS DRAINAGE  02/22/2013   i/d-rectal   COLONOSCOPY  06/2015   No polyps, done for family history of colon polyps Next would be 2026   EYE SURGERY Left age 60   lazy eye   INGUINAL HERNIA REPAIR Right 05/25/2013   Procedure: RIGHT INGUINAL HERNIA REPAIR  WITH MESH;  Surgeon: Imogene Burn. Georgette Dover, MD;  Location: [facility];  Service: General;  Laterality: Right;   INSERTION OF MESH Right 05/25/2013   Procedure: INSERTION OF MESH;  Surgeon: Imogene Burn. Georgette Dover, MD;  Location: [facility];  Service: General;  Laterality: Right;   NASAL SEPTUM SURGERY  10/26/2003   TONSILLECTOMY  70 years old   TOTAL KNEE ARTHROPLASTY Right 10/25/2010   rt total knee   TOTAL KNEE ARTHROPLASTY Left 03/03/2020   Procedure: TOTAL KNEE ARTHROPLASTY;   Surgeon: Gaynelle Arabian, MD;  Location: [facility];  Service: Orthopedics;  Laterality: Left;  44min   TRACHEAL SURGERY  10/26/1983   emergency trach from throat infection   UMBILICAL HERNIA REPAIR  10/26/1999   WISDOM TOOTH EXTRACTION      There were no vitals filed for this visit.   Subjective Assessment - 09/03/21 0806     Subjective Had some soreness in abdominals.  I bought some 10 pound weights and a mat for home.  Soreness in the morning but goes away.    Pertinent History PSH: bil TKR 2021  PMH: HLD, HTN, sleep apnea    Currently in Pain? Yes    Pain Score 2     Pain Location Hip    Pain Type Chronic pain                               [facility] Adult PT Treatment/Exercise - 09/03/21 0001       Lumbar Exercises: Aerobic   Nustep L1 4 min while discussing status      Lumbar Exercises: Standing   Other Standing Lumbar Exercises Pallof series kickstand position 5 rounds 5 reps red band right/left  Other Standing Lumbar Exercises 2 10# dead lifts 15x cues for slight knee flexion      Lumbar Exercises: Prone   Other Prone Lumbar Exercises modified planks knees down 10x 5-10 sec hold      Knee/Hip Exercises: Stretches   Active Hamstring Stretch Right;Left;5 reps    Active Hamstring Stretch Limitations 2nd step 5x    Hip Flexor Stretch Right;Left    Hip Flexor Stretch Limitations doorway rocking, then add arm slide and reach up/over x 2' in total to work into Rt side, greater ease on Lt      Knee/Hip Exercises: Standing   Walking with Sports Cord 20# backward walk 10x with handles    Other Standing Knee Exercises 10# carries suitcase, over the shoulder                     PT Education - 09/03/21 0846     Education Details Pallof series; suitcase carries    Person(s) Educated Patient    Methods Explanation;Demonstration;Handout    Comprehension Returned demonstration;Verbalized understanding              PT Short Term Goals - 08/26/21  1027       PT SHORT TERM GOAL #1   Title Pt ind with initial HEP    Status Achieved      PT SHORT TERM GOAL #2   Title Pt will report improved sensation along Rt posterior, lateral and medial hip by at least 25%    Status Achieved               PT Long Term Goals - 08/26/21 1028       PT LONG TERM GOAL #1   Title Pt will be ind with advanced HEP and understand how to progress safely    Status On-going      PT LONG TERM GOAL #2   Title Pt will report improved sensation along Rt hip, buttock, and groin by at least 70%    Status New      PT LONG TERM GOAL #3   Title Pt will demo bil LE flexibility and Rt QL length WFL to optimize pelvic and trunk mobility    Status On-going                   Plan - 09/03/21 0853     Clinical Impression Statement The patient is able to continue with LE and lumbo/pelvic strengthening.  Treatment focus on correcting asymmetry associated with right sided weakness.  Used mirror for feedback to avoid compensatory trunk lean during gait.  Continued with partial dead lifting to knee level.  Verbal cues for the right amount of knee flexion with dead lifts.  The patient reports he is feeling much better since start of rehab and is highly motivated with exercise and progression.    Personal Factors and Comorbidities Age;Time since onset of injury/illness/exacerbation;Comorbidity 1;Comorbidity 2;Comorbidity 3+    Comorbidities HTN, HLD, sleep apnea, chronic LBP    Rehab Potential Good    PT Frequency 2x / week    PT Duration 8 weeks    PT Treatment/Interventions ADLs/Self Care Home Management;Aquatic Therapy;Cryotherapy;Moist Heat;Iontophoresis 4mg /ml Dexamethasone;Electrical Stimulation;Neuromuscular re-education;Balance training;Therapeutic exercise;Therapeutic activities;Functional mobility training;Patient/family education;Manual techniques;Dry needling;Passive range of motion;Taping;Spinal Manipulations;Joint Manipulations    PT Next Visit  Plan dead lifting progression; farmer carries in left hand; SLS; step downs; modified planks;  right LE strength quads and glutes    PT Home Exercise Plan Access Code:  [email or web address]             Patient will benefit from skilled therapeutic intervention in order to improve the following deficits and impairments:  Decreased range of motion, Impaired sensation, Postural dysfunction, Decreased balance, Impaired flexibility, Hypomobility, Pain, Increased muscle spasms  Visit Diagnosis: Pain in right hip  Chronic right-sided low back pain without sciatica  Cramp and spasm     Problem List Patient Active Problem List   Diagnosis Date Noted   OA (osteoarthritis) of knee 03/03/2020   Murmur, cardiac 05/08/2014   RBBB 05/08/2014   Right inguinal hernia 03/06/2013   Abscess of right buttock 11/23/2012   Ruben Im, PT 09/03/21 8:59 AM Phone: [phone] Fax: [phone]  Alvera Singh, PT 09/03/2021, 8:59 AM  [facility] @ [place], [place], [address] Phone: [phone]   Fax:  [phone]  Name: Jesse [name] MRN: [id number] Date of Birth: 23-Dec-1950

## 2021-09-08 ENCOUNTER — Ambulatory Visit: Payer: Medicare PPO | Admitting: Physical Therapy

## 2021-09-08 ENCOUNTER — Other Ambulatory Visit: Payer: Self-pay

## 2021-09-08 DIAGNOSIS — G8929 Other chronic pain: Secondary | ICD-10-CM | POA: Diagnosis not present

## 2021-09-08 DIAGNOSIS — M25551 Pain in right hip: Secondary | ICD-10-CM | POA: Diagnosis not present

## 2021-09-08 DIAGNOSIS — M545 Low back pain, unspecified: Secondary | ICD-10-CM | POA: Diagnosis not present

## 2021-09-08 DIAGNOSIS — R252 Cramp and spasm: Secondary | ICD-10-CM | POA: Diagnosis not present

## 2021-09-08 NOTE — Therapy (Signed)
[facility] @ [place], [place], [address] Phone: [phone]   Fax:  [phone]  Physical Therapy Treatment  Patient Details  Name: Jesse [name] MRN: [id number] Date of Birth: [birth year] Referring Provider (PT): London Pepper, MD   Encounter Date: 09/08/2021   PT End of Session - 09/08/21 1737     Visit Number 7    Date for PT Re-Evaluation 10/07/21    Authorization Type [employer]    Authorization Time Period 16 visits approved 08/12/21 through 10/06/21    Authorization - Visit Number 7    Authorization - Number of Visits 16    Progress Note Due on Visit 10    PT Start Time 1100    PT Stop Time 1143    PT Time Calculation (min) 43 min    Activity Tolerance Patient tolerated treatment well             Past Medical History:  Diagnosis Date   Arthritis    knees   Asthma    seasonal   Eczema    GERD (gastroesophageal reflux disease)    HLD (hyperlipidemia)    HTN (hypertension)    Pulmonary emboli ([place])    post op knee scope for a few months   Sleep apnea 2004   TA (temporal arteritis) ([place])    Wears glasses     Past Surgical History:  Procedure Laterality Date   ABCESS DRAINAGE  02/22/2013   i/d-rectal   COLONOSCOPY  06/2015   No polyps, done for family history of colon polyps Next would be 2026   EYE SURGERY Left age 70   lazy eye   INGUINAL HERNIA REPAIR Right 05/25/2013   Procedure: RIGHT INGUINAL HERNIA REPAIR  WITH MESH;  Surgeon: Imogene Burn. Georgette Dover, MD;  Location: [facility];  Service: General;  Laterality: Right;   INSERTION OF MESH Right 05/25/2013   Procedure: INSERTION OF MESH;  Surgeon: Imogene Burn. Georgette Dover, MD;  Location: [facility];  Service: General;  Laterality: Right;   NASAL SEPTUM SURGERY  10/26/2003   TONSILLECTOMY  70 years old   TOTAL KNEE ARTHROPLASTY Right 10/25/2010   rt total knee   TOTAL KNEE ARTHROPLASTY Left 03/03/2020    Procedure: TOTAL KNEE ARTHROPLASTY;  Surgeon: Gaynelle Arabian, MD;  [facility];  Service: Orthopedics;  Laterality: Left;  49min   TRACHEAL SURGERY  10/26/1983   emergency trach from throat infection   UMBILICAL HERNIA REPAIR  10/26/1999   WISDOM TOOTH EXTRACTION      There were no vitals filed for this visit.   Subjective Assessment - 09/08/21 1104     Subjective Abdominals not quite as sore.  I've zeroed on about 6 exercises.  I couldn't get the band ex's quite right.  My favorite is the doorway stretch.  Less pain in the morning.    Pertinent History PSH: bil TKR 2021  PMH: HLD, HTN, sleep apnea    Patient Stated Goals get rid of symptoms - can still play golf but takes Aleve    Currently in Pain? No/denies    Pain Score 0-No pain                               [facility] Adult PT Treatment/Exercise - 09/08/21 0001       Lumbar Exercises: Standing   Lifting Limitations holding 5# overhead with toe touch to  BOSU 10x right/left    Row Limitations 10# snatch and press overhead 10x right/left    Other Standing Lumbar Exercises Pallof series kickstand position 5 rounds 5 reps red band right/left; stir the pot, ABCs 5x each way    Other Standing Lumbar Exercises 30# bar dead lifts 15x      Lumbar Exercises: Supine   Bent Knee Raise 10 reps      Lumbar Exercises: Sidelying   Other Sidelying Lumbar Exercises side planks 3x right/left      Knee/Hip Exercises: Standing   Forward Step Up --      Knee/Hip Exercises: Sidelying   Other Sidelying Knee/Hip Exercises --                     PT Education - 09/08/21 1143     Education Details snatch and press overhead; side planks    Person(s) Educated Patient    Methods Explanation;Demonstration;Handout    Comprehension Returned demonstration;Verbalized understanding              PT Short Term Goals - 08/26/21 1027       PT SHORT TERM GOAL #1   Title Pt ind with initial HEP    Status Achieved       PT SHORT TERM GOAL #2   Title Pt will report improved sensation along Rt posterior, lateral and medial hip by at least 25%    Status Achieved               PT Long Term Goals - 08/26/21 1028       PT LONG TERM GOAL #1   Title Pt will be ind with advanced HEP and understand how to progress safely    Status On-going      PT LONG TERM GOAL #2   Title Pt will report improved sensation along Rt hip, buttock, and groin by at least 70%    Status New      PT LONG TERM GOAL #3   Title Pt will demo bil LE flexibility and Rt QL length WFL to optimize pelvic and trunk mobility    Status On-going                   Plan - 09/08/21 1118     Clinical Impression Statement The patient is progressing very well with lumbo/pelvic hip strengthening.  He is able to progress to 30# bar dead lifting without production of pain.  Fewer verbal cues needed for hip hinge.  He finds sidelying planks/bridges to be quite challenging "but in a good way."  Some verbal cues with supine abdominal brace ex's to coordinate breathing.  He remains highly motivated to exercise and plans to continue with his HEP while traveling.    Personal Factors and Comorbidities Age;Time since onset of injury/illness/exacerbation;Comorbidity 1;Comorbidity 2;Comorbidity 3+    Comorbidities HTN, HLD, sleep apnea, chronic LBP    Examination-Participation Restrictions Community Activity    Rehab Potential Good    PT Frequency 2x / week    PT Duration 8 weeks    PT Treatment/Interventions ADLs/Self Care Home Management;Aquatic Therapy;Cryotherapy;Moist Heat;Iontophoresis 4mg /ml Dexamethasone;Electrical Stimulation;Neuromuscular re-education;Balance training;Therapeutic exercise;Therapeutic activities;Functional mobility training;Patient/family education;Manual techniques;Dry needling;Passive range of motion;Taping;Spinal Manipulations;Joint Manipulations    PT Next Visit Plan dead lifting progression 30#; Pallof series;  side planks; snatch and press;  farmer carries in left hand; SLS; step downs; modified planks;  right LE strength quads and glutes    PT Home Exercise Plan Access Code:  [email or web address]             Patient will benefit from skilled therapeutic intervention in order to improve the following deficits and impairments:  Decreased range of motion, Impaired sensation, Postural dysfunction, Decreased balance, Impaired flexibility, Hypomobility, Pain, Increased muscle spasms  Visit Diagnosis: Pain in right hip  Chronic right-sided low back pain without sciatica  Cramp and spasm     Problem List Patient Active Problem List   Diagnosis Date Noted   OA (osteoarthritis) of knee 03/03/2020   Murmur, cardiac 05/08/2014   RBBB 05/08/2014   Right inguinal hernia 03/06/2013   Abscess of right buttock 11/23/2012   Ruben Im, PT 09/08/21 5:42 PM Phone: [phone] Fax: [phone]  Alvera Singh, PT 09/08/2021, 5:42 PM  [facility] @ [address] [place], [place], [address] Phone: [phone]   Fax:  [phone]  Name: Jesse [name] MRN: [id number] Date of Birth: [birth year]

## 2021-09-08 NOTE — Patient Instructions (Signed)
Access Code: [email or web address] URL: [email or web address] Date: 09/08/2021 Prepared by: Ruben Im  Exercises Seated Sidebending Arms Overhead - 1 x daily - 7 x weekly - 1 sets - 2 reps - 20 hold Quadruped Rocking Slow - 1 x daily - 7 x weekly - 1 sets - 15 reps Supine Double Knee to Chest - 1 x daily - 7 x weekly - 1 sets - 60 reps Supine Piriformis Stretch with Foot on Ground - 1 x daily - 7 x weekly - 1 sets - 2 reps - 20 hold Sidelying Open Book Thoracic Rotation with Knee on Foam Roll - 1 x daily - 7 x weekly - 1 sets - 10 reps Seated Thoracic Lumbar Extension with Pectoralis Stretch - 1 x daily - 7 x weekly - 1 sets - 10 reps doorway hip flexor stretch - 1 x daily - 7 x weekly - 3 sets - 5 reps Clamshell with Resistance - 1 x daily - 7 x weekly - 2 sets - 10 reps Bird Dog - 1 x daily - 7 x weekly - 1 sets - 5 reps - 5 hold Standing Hip Hinge with Dowel - 1 x daily - 7 x weekly - 1 sets - 10 reps Marching with Same-Side Arm Raise and Walker - 1 x daily - 7 x weekly - 1 sets - 10 reps Plank on Knees - 1 x daily - 7 x weekly - 1 sets - 5 reps - 5 hold Half Deadlift with Kettlebell - 1 x daily - 7 x weekly - 1 sets - 10 reps Forward Step Down Touch with Heel - 1 x daily - 7 x weekly - 1 sets - 10 reps Marching with Same-Side Arm Raise - 1 x daily - 7 x weekly - 1 sets - 10 reps Standing Anti-Rotation Press with Anchored Resistance - 1 x daily - 7 x weekly - 5 sets - 15 reps Kettlebell Suitcase Carry (Mirrored) - 1 x daily - 7 x weekly - 1 sets - 10 reps SNATCH AND PRESS OVERHEAD - 1 x daily - 7 x weekly - 1 sets - 10 reps Side Plank on Knees - 1 x daily - 7 x weekly - 1 sets - 10 reps

## 2021-09-10 ENCOUNTER — Encounter: Payer: Medicare PPO | Admitting: Physical Therapy

## 2021-09-22 ENCOUNTER — Other Ambulatory Visit: Payer: Self-pay

## 2021-09-22 ENCOUNTER — Ambulatory Visit: Payer: Medicare PPO | Admitting: Physical Therapy

## 2021-09-22 DIAGNOSIS — M25551 Pain in right hip: Secondary | ICD-10-CM | POA: Diagnosis not present

## 2021-09-22 DIAGNOSIS — R252 Cramp and spasm: Secondary | ICD-10-CM

## 2021-09-22 DIAGNOSIS — G8929 Other chronic pain: Secondary | ICD-10-CM | POA: Diagnosis not present

## 2021-09-22 DIAGNOSIS — M545 Low back pain, unspecified: Secondary | ICD-10-CM | POA: Diagnosis not present

## 2021-09-22 NOTE — Patient Instructions (Signed)

## 2021-09-22 NOTE — Therapy (Signed)
[facility] @ [address] [place], [place], [address] Phone: [phone]   Fax:  [phone]  Physical Therapy Treatment  Patient Details  Name: Jesse [name] MRN: [id number] Date of Birth: 1951/03/03 Referring Provider (PT): London Pepper, MD   Encounter Date: 09/22/2021   PT End of Session - 09/22/21 1559     Visit Number 8    Date for PT Re-Evaluation 10/07/21    Authorization Type [employer]    Authorization Time Period 16 visits approved 08/12/21 through 10/06/21    Authorization - Visit Number 8    Authorization - Number of Visits 16    Progress Note Due on Visit 10    PT Start Time 1100    PT Stop Time 2951    PT Time Calculation (min) 44 min    Activity Tolerance Patient tolerated treatment well             Past Medical History:  Diagnosis Date   Arthritis    knees   Asthma    seasonal   Eczema    GERD (gastroesophageal reflux disease)    HLD (hyperlipidemia)    HTN (hypertension)    Pulmonary emboli ([facility])    post op knee scope for a few months   Sleep apnea 2004   TA (temporal arteritis) ([place])    Wears glasses     Past Surgical History:  Procedure Laterality Date   ABCESS DRAINAGE  02/22/2013   i/d-rectal   COLONOSCOPY  06/2015   No polyps, done for family history of colon polyps Next would be 2026   EYE SURGERY Left age 23   lazy eye   INGUINAL HERNIA REPAIR Right 05/25/2013   Procedure: RIGHT INGUINAL HERNIA REPAIR  WITH MESH;  Surgeon: Imogene Burn. Georgette Dover, MD;  Location: [facility];  Service: General;  Laterality: Right;   INSERTION OF MESH Right 05/25/2013   Procedure: INSERTION OF MESH;  Surgeon: Imogene Burn. Georgette Dover, MD;  Location: [facility];  Service: General;  Laterality: Right;   NASAL SEPTUM SURGERY  10/26/2003   TONSILLECTOMY  70 years old   TOTAL KNEE ARTHROPLASTY Right 10/25/2010   rt total knee   TOTAL KNEE ARTHROPLASTY Left 03/03/2020    Procedure: TOTAL KNEE ARTHROPLASTY;  Surgeon: Gaynelle Arabian, MD;  [facility];  Service: Orthopedics;  Laterality: Left;  64min   TRACHEAL SURGERY  10/26/1983   emergency trach from throat infection   UMBILICAL HERNIA REPAIR  10/26/1999   WISDOM TOOTH EXTRACTION      There were no vitals filed for this visit.   Subjective Assessment - 09/22/21 1432     Subjective About a week ago I was lifting a big pot of sweet potatoes and I twisted and my right side of my back has been really hurting. Moderate limp on arrival.    Patient is accompained by: Family member   wife present today   Pertinent History PSH: bil TKR 2021  PMH: HLD, HTN, sleep apnea    Patient Stated Goals get rid of symptoms - can still play golf but takes Aleve    Currently in Pain? Yes    Pain Score 5     Pain Location Back    Pain Orientation Right    Pain Type Acute pain  [facility] Adult PT Treatment/Exercise - 09/22/21 0001       Lumbar Exercises: Stretches   Single Knee to Chest Stretch Right;Left;5 reps    Lower Trunk Rotation 5 reps      Lumbar Exercises: Standing   Other Standing Lumbar Exercises wall lean in/outs      Lumbar Exercises: Quadruped   Other Quadruped Lumbar Exercises rocking with and without ankles crossed      Acupuncturist Stimulation Location right lumbar    Electrical Stimulation Action IFC    Electrical Stimulation Parameters 10 ma 10 min    Electrical Stimulation Goals Pain      Manual Therapy   Manual therapy comments pelvic neutral gapping and distraction grade 3; right long axis distraction, inferior and AP in internal rotation grade 3 3x 20 sec each    Soft tissue mobilization Addaday seated to thoracolumbar fascia on right; also used with dynamic movements on wall                     PT Education - 09/22/21 1559     Education Details info on home TENS, massage gun and dry needling basic info     Person(s) Educated Patient;Spouse    Methods Explanation;Handout    Comprehension Verbalized understanding              PT Short Term Goals - 08/26/21 1027       PT SHORT TERM GOAL #1   Title Pt ind with initial HEP    Status Achieved      PT SHORT TERM GOAL #2   Title Pt will report improved sensation along Rt posterior, lateral and medial hip by at least 25%    Status Achieved               PT Long Term Goals - 08/26/21 1028       PT LONG TERM GOAL #1   Title Pt will be ind with advanced HEP and understand how to progress safely    Status On-going      PT LONG TERM GOAL #2   Title Pt will report improved sensation along Rt hip, buttock, and groin by at least 70%    Status New      PT LONG TERM GOAL #3   Title Pt will demo bil LE flexibility and Rt QL length WFL to optimize pelvic and trunk mobility    Status On-going                   Plan - 09/22/21 1600     Clinical Impression Statement The patient  presents with a moderate limp and reports a flare up while lifting/twisting last week.  He responded well to manual therapy including instrument assisted myofascial release to lumbar fascia in addition to hip and pelvic joint mobs.  Also had a good response to ES and he was given info on home TENS units.  He reports feeling much better post treatment and is ambulating with minimal limp after session.    Personal Factors and Comorbidities Age;Time since onset of injury/illness/exacerbation;Comorbidity 1;Comorbidity 2;Comorbidity 3+    Comorbidities HTN, HLD, sleep apnea, chronic LBP    Rehab Potential Good    PT Frequency 2x / week    PT Duration 8 weeks    PT Treatment/Interventions ADLs/Self Care Home Management;Aquatic Therapy;Cryotherapy;Moist Heat;Iontophoresis 4mg /ml Dexamethasone;Electrical Stimulation;Neuromuscular re-education;Balance training;Therapeutic exercise;Therapeutic activities;Functional mobility training;Patient/family  education;Manual techniques;Dry needling;Passive range of motion;Taping;Spinal Manipulations;Joint Manipulations  PT Next Visit Plan DN to right lumbar multifidi as needed; Addaday, ES as needed;  return to exercise as able prior to flare up;  dead lifting progression 30#; Pallof series; side planks; snatch and press;  farmer carries in left hand; SLS; step downs; modified planks;  right LE strength quads and glutes    PT Home Exercise Plan Access Code: [email or web address]             Patient will benefit from skilled therapeutic intervention in order to improve the following deficits and impairments:  Decreased range of motion, Impaired sensation, Postural dysfunction, Decreased balance, Impaired flexibility, Hypomobility, Pain, Increased muscle spasms  Visit Diagnosis: Pain in right hip  Chronic right-sided low back pain without sciatica  Cramp and spasm     Problem List Patient Active Problem List   Diagnosis Date Noted   OA (osteoarthritis) of knee 03/03/2020   Murmur, cardiac 05/08/2014   RBBB 05/08/2014   Right inguinal hernia 03/06/2013   Abscess of right buttock 11/23/2012   Ruben Im, PT 09/22/21 4:07 PM Phone: [phone] Fax: [phone]  Alvera Singh, PT 09/22/2021, 4:06 PM  [facility] @ [address] [place], [place], [address] Phone: [phone]   Fax:  [phone]  Name: SHANNAN GARFINKEL MRN: [id number] Date of Birth: 1951/02/19

## 2021-09-24 ENCOUNTER — Ambulatory Visit: Payer: Medicare PPO | Admitting: Sports Medicine

## 2021-09-24 ENCOUNTER — Ambulatory Visit: Payer: Medicare PPO | Attending: Family Medicine | Admitting: Physical Therapy

## 2021-09-24 ENCOUNTER — Other Ambulatory Visit: Payer: Self-pay

## 2021-09-24 ENCOUNTER — Encounter: Payer: Self-pay | Admitting: Sports Medicine

## 2021-09-24 DIAGNOSIS — M545 Low back pain, unspecified: Secondary | ICD-10-CM | POA: Diagnosis not present

## 2021-09-24 DIAGNOSIS — B351 Tinea unguium: Secondary | ICD-10-CM

## 2021-09-24 DIAGNOSIS — M79674 Pain in right toe(s): Secondary | ICD-10-CM | POA: Diagnosis not present

## 2021-09-24 DIAGNOSIS — M79675 Pain in left toe(s): Secondary | ICD-10-CM

## 2021-09-24 DIAGNOSIS — M722 Plantar fascial fibromatosis: Secondary | ICD-10-CM

## 2021-09-24 DIAGNOSIS — G8929 Other chronic pain: Secondary | ICD-10-CM | POA: Diagnosis not present

## 2021-09-24 DIAGNOSIS — M25551 Pain in right hip: Secondary | ICD-10-CM | POA: Insufficient documentation

## 2021-09-24 DIAGNOSIS — R252 Cramp and spasm: Secondary | ICD-10-CM | POA: Insufficient documentation

## 2021-09-24 MED ORDER — TRIAMCINOLONE ACETONIDE 10 MG/ML IJ SUSP
10.0000 mg | Freq: Once | INTRAMUSCULAR | Status: AC
  Administered 2021-09-24: 10 mg

## 2021-09-24 NOTE — Therapy (Signed)
[facility] @ [address], [place], [address] Phone: [phone]   Fax:  [phone]  Physical Therapy Treatment  Patient Details  Name: Jesse [name] MRN: [id number] Date of Birth: [birth year] Referring Provider (PT): London Pepper, MD   Encounter Date: 09/24/2021   PT End of Session - 09/24/21 1901     Visit Number 9    Date for PT Re-Evaluation 10/07/21    Authorization Type [employer]    Authorization Time Period 16 visits approved 08/12/21 through 10/06/21    Authorization - Visit Number 9    Authorization - Number of Visits 16    Progress Note Due on Visit 10    PT Start Time 0930    PT Stop Time 9518    PT Time Calculation (min) 44 min    Activity Tolerance Patient tolerated treatment well             Past Medical History:  Diagnosis Date   Arthritis    knees   Asthma    seasonal   Eczema    GERD (gastroesophageal reflux disease)    HLD (hyperlipidemia)    HTN (hypertension)    Pulmonary emboli ([place])    post op knee scope for a few months   Sleep apnea 2004   TA (temporal arteritis) ([place])    Wears glasses     Past Surgical History:  Procedure Laterality Date   ABCESS DRAINAGE  02/22/2013   i/d-rectal   COLONOSCOPY  06/2015   No polyps, done for family history of colon polyps Next would be 2026   EYE SURGERY Left age 70   lazy eye   INGUINAL HERNIA REPAIR Right 05/25/2013   Procedure: RIGHT INGUINAL HERNIA REPAIR  WITH MESH;  Surgeon: Imogene Burn. Georgette Dover, MD;  Location: [facility];  Service: General;  Laterality: Right;   INSERTION OF MESH Right 05/25/2013   Procedure: INSERTION OF MESH;  Surgeon: Imogene Burn. Georgette Dover, MD;  Location: [facility];  Service: General;  Laterality: Right;   NASAL SEPTUM SURGERY  10/26/2003   TONSILLECTOMY  70 years old   TOTAL KNEE ARTHROPLASTY Right 10/25/2010   rt total knee   TOTAL KNEE ARTHROPLASTY Left 03/03/2020    Procedure: TOTAL KNEE ARTHROPLASTY;  Surgeon: Gaynelle Arabian, MD;  Location: [facility];  Service: Orthopedics;  Laterality: Left;  56min   TRACHEAL SURGERY  10/26/1983   emergency trach from throat infection   UMBILICAL HERNIA REPAIR  10/26/1999   WISDOM TOOTH EXTRACTION      There were no vitals filed for this visit.   Subjective Assessment - 09/24/21 1024     Subjective I'm better than I was but still feel a spot on the right side of my back.  We ordered one of the massage guns.  It comes tomorrow.    Patient is accompained by: Family member    Pertinent History PSH: bil TKR 2021  PMH: HLD, HTN, sleep apnea    Patient Stated Goals get rid of symptoms - can still play golf but takes Aleve    Currently in Pain? 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      Moist Heat Therapy   Number Minutes Moist Heat 3 Minutes    Moist Heat Location Lumbar Spine      Electrical Stimulation   Electrical Stimulation Location right lumbar with DN    Electrical Stimulation Action pre-mod with DN    Electrical Stimulation Parameters 1.5 ma 10 min    Electrical Stimulation Goals Pain      Manual Therapy   Manual therapy comments pelvic distraction and neutral gapping grade 3 3x 30 sec    Soft tissue mobilization Addaday seated to thoracolumbar fascia on right; also used with erector spinae stretch              Trigger Point Dry Needling - 09/24/21 0001     Consent Given? Yes    Education Handout Provided Yes    Muscles Treated Back/Hip Lumbar multifidi    Dry Needling Comments right side only    Electrical Stimulation Performed with Dry Needling Yes    E-stim with Dry Needling Details right lumbar multifidi    Lumbar multifidi  Response Palpable increased muscle length                   PT Education - 09/24/21 1009     Education Details DN after care    Person(s) Educated Patient    Methods Explanation;Demonstration;Handout    Comprehension Returned demonstration;Verbalized understanding              PT Short Term Goals - 08/26/21 1027       PT SHORT TERM GOAL #1   Title Pt ind with initial HEP    Status Achieved      PT SHORT TERM GOAL #2   Title Pt will report improved sensation along Rt posterior, lateral and medial hip by at least 25%    Status Achieved               PT Long Term Goals - 08/26/21 1028       PT LONG TERM GOAL #1   Title Pt will be ind with advanced HEP and understand how to progress safely    Status On-going      PT LONG TERM GOAL #2   Title Pt will report improved sensation along Rt hip, buttock, and groin by at least 70%    Status New      PT LONG TERM GOAL #3   Title Pt will demo bil LE flexibility and Rt QL length WFL to optimize pelvic and trunk mobility    Status On-going                   Plan - 09/24/21 1902     Clinical Impression Statement The patient has continued discomfort in right lumbar region after incident with lifting/twisting. Initiated DN with ES with a good response of myofascial release following.  Decreased spasm compared to last visit.  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  Therapist monitoring response throughout session with all interventions.  Assess progress toward goals next visit.    Personal Factors and Comorbidities Age;Time since onset of injury/illness/exacerbation;Comorbidity 1;Comorbidity 2;Comorbidity 3+    Comorbidities HTN, HLD, sleep apnea, chronic LBP    Rehab Potential Good    PT Frequency 2x / week  PT Duration 8 weeks    PT Treatment/Interventions ADLs/Self Care Home Management;Aquatic Therapy;Cryotherapy;Moist  Heat;Iontophoresis 4mg /ml Dexamethasone;Electrical Stimulation;Neuromuscular re-education;Balance training;Therapeutic exercise;Therapeutic activities;Functional mobility training;Patient/family education;Manual techniques;Dry needling;Passive range of motion;Taping;Spinal Manipulations;Joint Manipulations    PT Next Visit Plan 10th visit progress note;  assess response to DN to right lumbar multifidi with ES; return to exercise as able prior to flare up;  dead lifting progression 30#; Pallof series; side planks; snatch and press;  farmer carries in left hand; SLS; step downs; modified planks;  right LE strength quads and glutes    PT Home Exercise Plan Access Code: [place]             Patient will benefit from skilled therapeutic intervention in order to improve the following deficits and impairments:  Decreased range of motion, Impaired sensation, Postural dysfunction, Decreased balance, Impaired flexibility, Hypomobility, Pain, Increased muscle spasms  Visit Diagnosis: Pain in right hip  Chronic right-sided low back pain without sciatica  Cramp and spasm     Problem List Patient Active Problem List   Diagnosis Date Noted   OA (osteoarthritis) of knee 03/03/2020   Murmur, cardiac 05/08/2014   RBBB 05/08/2014   Right inguinal hernia 03/06/2013   Abscess of right buttock 11/23/2012   Ruben Im, PT 09/24/21 7:11 PM Phone: [phone] Fax: [phone]  Alvera Singh, PT 09/24/2021, 7:10 PM  [facility] @ [place], [place], [address] Phone: [phone]   Fax:  [phone]  Name: Jesse [name] MRN: [id number] Date of Birth: 05-21-1951

## 2021-09-24 NOTE — Progress Notes (Signed)
Subjective: Jesse [name] is a 70 y.o. male patient seen today in office for fungal culture results and for follow-up evaluation of left heel pain.  Patient reports that he still has some pain in the heel states that it may be slightly better has been using plantar fascial brace and doing stretching but still has pain especially when he stands barefooted. Patient has no other pedal complaints at this time.   Patient Active Problem List   Diagnosis Date Noted   OA (osteoarthritis) of knee 03/03/2020   Murmur, cardiac 05/08/2014   RBBB 05/08/2014   Right inguinal hernia 03/06/2013   Abscess of right buttock 11/23/2012    Current Outpatient Medications on File Prior to Visit  Medication Sig Dispense Refill   albuterol (PROVENTIL HFA;VENTOLIN HFA) 108 (90 BASE) MCG/ACT inhaler Inhale 2 puffs into the lungs every 6 (six) hours as needed for wheezing.      amoxicillin (AMOXIL) 500 MG tablet Take 2,000 mg by mouth See admin instructions. Take 2000 mg 1 hour prior to dental work (Patient not taking: Reported on 05/28/2021)  1   aspirin EC 81 MG tablet Take 81 mg by mouth daily. Swallow whole.     atorvastatin (LIPITOR) 10 MG tablet Take 10 mg by mouth daily.     Brimonidine Tartrate (LUMIFY) 0.025 % SOLN Place 1 drop into both eyes daily as needed (redness).     cetirizine (ZYRTEC) 10 MG tablet Take 10 mg by mouth daily.     clobetasol ointment (TEMOVATE) 0.05 % clobetasol 0.05 % topical ointment  APPLY TO HANDS TWICE A DAY OR AS MUCH AS YOU WASH HANDS FOR 2 WEEKS     fluocinonide cream (LIDEX) 2.54 % Apply 1 application topically 2 (two) times daily as needed (outbreaks).     fluticasone (FLOVENT HFA) 110 MCG/ACT inhaler Inhale 1 puff into the lungs daily as needed (allergies).      hydrochlorothiazide (HYDRODIURIL) 25 MG tablet Take 25 mg by mouth daily.     sildenafil (REVATIO) 20 MG tablet Take 60 mg by mouth daily as needed for erectile dysfunction.     No current facility-administered  medications on file prior to visit.    Allergies  Allergen Reactions   Adhesive [Tape] Other (See Comments)    Skin bumps use paper tape   Oxycodone Itching    Objective: Physical Exam  General: Well developed, nourished, no acute distress, awake, alert and oriented x 3  Vascular: Dorsalis pedis artery 2/4 bilateral, Posterior tibial artery 2/4 bilateral, skin temperature warm to warm proximal to distal bilateral lower extremities, no varicosities, pedal hair present bilateral.  Neurological: Gross sensation present via light touch bilateral.   Dermatological: Skin is warm, dry, and supple bilateral, left greater than right hallux nails are tender, short thick, and discolored with mild subungal debris, no webspace macerations present bilateral, no open lesions present bilateral, no callus/corns/hyperkeratotic tissue present bilateral. No signs of infection bilateral.  Musculoskeletal: Residual pain with palpation to the medial calcaneal insertion of the plantar fascia and to the central heel with most pain noted to the central heel on the left foot.  Limited ankle joint range of motion.  No pain with calf compression bilateral.  Fungal culture + Trichophyton rubrum  Assessment and Plan:  Problem List Items Addressed This Visit   None Visit Diagnoses     Nail fungus    -  Primary   Toe pain, bilateral       Plantar fasciitis, left  Relevant Medications   triamcinolone acetonide (KENALOG) 10 MG/ML injection 10 mg (Completed) (Start on 09/24/2021  7:30 PM)       -Examined patient -Discussed treatment options for residual left plantar fasciitis -After oral consent and aseptic prep, injected a mixture containing 1 ml of 2%  plain lidocaine, 1 ml 0.5% plain marcaine, 0.5 ml of kenalog 10 and 0.5 ml of dexamethasone phosphate into plantar central heel on the left without complication. Post-injection care discussed with patient.  -Advised patient to continue with plantar  fascial brace and supportive shoes daily -Advised icing daily in the evening for 15 to 20 minutes until pain or symptoms have resolved -Advised patient to continue with daily stretching -Discussed treatment options for painful mycotic nails -Patient opt for laser again -Advised good hygiene habits -Patient to return in 6 weeks for follow up evaluation on left heel pain and as scheduled for laser or sooner if symptoms worsen.  Landis Martins, DPM

## 2021-09-24 NOTE — Patient Instructions (Signed)

## 2021-09-29 ENCOUNTER — Ambulatory Visit: Payer: Medicare PPO | Admitting: Physical Therapy

## 2021-09-29 ENCOUNTER — Other Ambulatory Visit: Payer: Self-pay

## 2021-09-29 DIAGNOSIS — M25551 Pain in right hip: Secondary | ICD-10-CM | POA: Diagnosis not present

## 2021-09-29 DIAGNOSIS — R252 Cramp and spasm: Secondary | ICD-10-CM

## 2021-09-29 DIAGNOSIS — M545 Low back pain, unspecified: Secondary | ICD-10-CM | POA: Diagnosis not present

## 2021-09-29 DIAGNOSIS — G8929 Other chronic pain: Secondary | ICD-10-CM

## 2021-09-29 NOTE — Therapy (Signed)
[facility] @ [address] [place], [place], [address] Phone: [phone]   Fax:  [phone]  Physical Therapy Treatment  Patient Details  Name: Jesse [name] MRN: [id number] Date of Birth: 08-18-1951 Referring Provider (PT): London Pepper, MD  Progress Note Reporting Period 08/12/2021 to 09/29/2021  See note below for Objective Data and Assessment of Progress/Goals.     Encounter Date: 09/29/2021   PT End of Session - 09/29/21 1025     Visit Number 10    Date for PT Re-Evaluation 10/07/21    Authorization Type [employer] until 12/31    PT Start Time 0930    PT Stop Time 2426    PT Time Calculation (min) 45 min    Activity Tolerance Patient tolerated treatment well             Past Medical History:  Diagnosis Date   Arthritis    knees   Asthma    seasonal   Eczema    GERD (gastroesophageal reflux disease)    HLD (hyperlipidemia)    HTN (hypertension)    Pulmonary emboli ([place])    post op knee scope for a few months   Sleep apnea 2004   TA (temporal arteritis) ([place])    Wears glasses     Past Surgical History:  Procedure Laterality Date   ABCESS DRAINAGE  02/22/2013   i/d-rectal   COLONOSCOPY  06/2015   No polyps, done for family history of colon polyps Next would be 2026   EYE SURGERY Left age 70   lazy eye   INGUINAL HERNIA REPAIR Right 05/25/2013   Procedure: RIGHT INGUINAL HERNIA REPAIR  WITH MESH;  Surgeon: Imogene Burn. Georgette Dover, MD;  Location: [facility];  Service: General;  Laterality: Right;   INSERTION OF MESH Right 05/25/2013   Procedure: INSERTION OF MESH;  Surgeon: Imogene Burn. Georgette Dover, MD;  Location: [facility];  Service: General;  Laterality: Right;   NASAL SEPTUM SURGERY  10/26/2003   TONSILLECTOMY  70 years old   TOTAL KNEE ARTHROPLASTY Right 10/25/2010   rt total knee   TOTAL KNEE ARTHROPLASTY Left 03/03/2020   Procedure: TOTAL KNEE ARTHROPLASTY;   Surgeon: Gaynelle Arabian, MD;  Location: [facility];  Service: Orthopedics;  Laterality: Left;  34min   TRACHEAL SURGERY  10/26/1983   emergency trach from throat infection   UMBILICAL HERNIA REPAIR  10/26/1999   WISDOM TOOTH EXTRACTION      There were no vitals filed for this visit.   Subjective Assessment - 09/29/21 0927     Subjective The DN helped a lot.  It got better and better each day.  I can feel that lipoma today.    Pertinent History PSH: bil TKR 2021  PMH: HLD, HTN, sleep apnea    Currently in Pain? Yes    Pain Score 3     Pain Location Back                [facility] PT Assessment - 09/29/21 0001       Single Leg Stance   Comments SLS time 8-10 sec right/left      AROM   Lumbar Flexion 70    Lumbar Extension 22    Lumbar - Right Side Bend 32    Lumbar - Left Side Bend 25      Strength   Overall Strength Comments Glute med strength 4/5 right, 4+/5 left;  trunk strength 4/5  Flexibility   Hamstrings 90 degrees bil    Quadratus Lumborum WFLs                           [facility] Adult PT Treatment/Exercise - 09/29/21 0001       Lumbar Exercises: Stretches   Single Knee to Chest Stretch Right;Left;5 reps    Lower Trunk Rotation 5 reps    Lower Trunk Rotation Limitations with ball    Other Lumbar Stretch Exercise seated erector spinae stretch      Lumbar Exercises: Aerobic   Nustep L5 7 min while discussing status      Lumbar Exercises: Machines for Strengthening   Other Lumbar Machine Exercise standing lat bar pulls downs 40# 15x      Lumbar Exercises: Standing   Other Standing Lumbar Exercises Pallof red band kickstand ABCs right and left    Other Standing Lumbar Exercises Ball on wall hip abduction with small dips 8x right/left      Lumbar Exercises: Supine   Isometric Hip Flexion 10 reps                       PT Short Term Goals - 08/26/21 1027       PT SHORT TERM GOAL #1   Title Pt ind with initial HEP    Status  Achieved      PT SHORT TERM GOAL #2   Title Pt will report improved sensation along Rt posterior, lateral and medial hip by at least 25%    Status Achieved               PT Long Term Goals - 09/29/21 1052       PT LONG TERM GOAL #1   Title Pt will be ind with advanced HEP and understand how to progress safely    Time 8    Period Weeks    Status On-going      PT LONG TERM GOAL #2   Title Pt will report improved sensation along Rt hip, buttock, and groin by at least 70%    Time 8    Period Weeks    Status On-going      PT LONG TERM GOAL #3   Title Pt will demo bil LE flexibility and Rt QL length WFL to optimize pelvic and trunk mobility    Status Achieved      PT LONG TERM GOAL #4   Title Pt will be able to demo SLS on Rt and Lt LE without UE support x 20 sec to demo improved closed chain stability and strength for dynamic tasks.    Time 8    Period Weeks    Status On-going                   Plan - 09/29/21 1027     Clinical Impression Statement The patient reports significant relief from DN with ES last session.  He is able to resume lumbar and hip mobility ex's today with minimal to no pain.   Improved HS and hip flexor lengths noted.  Abdominal and trunk extensor strength grossly 4/5.  Decreased glute med strength noted right more so than left which may be affecting single leg stance limited to 8-10 sec (improved from 3 sec).  Verbal cues for upright posture and to coordinate breathing with exercise.   Good progress with goals and track to meet remaining goals.    Personal  Factors and Comorbidities Age;Time since onset of injury/illness/exacerbation;Comorbidity 1;Comorbidity 2;Comorbidity 3+    Comorbidities HTN, HLD, sleep apnea, chronic LBP    Rehab Potential Good    PT Frequency 2x / week    PT Duration 8 weeks    PT Treatment/Interventions ADLs/Self Care Home Management;Aquatic Therapy;Cryotherapy;Moist Heat;Iontophoresis 4mg /ml Dexamethasone;Electrical  Stimulation;Neuromuscular re-education;Balance training;Therapeutic exercise;Therapeutic activities;Functional mobility training;Patient/family education;Manual techniques;Dry needling;Passive range of motion;Taping;Spinal Manipulations;Joint Manipulations    PT Next Visit Plan DN to right lumbar multifidi with ES; return to exercise as able prior to flare up;  dead lifting progression 30#; Pallof series; side planks; snatch and press;  farmer carries in left hand; SLS; step downs; modified planks;  right LE strength quads and glutes    PT Home Exercise Plan Access Code: [place]             Patient will benefit from skilled therapeutic intervention in order to improve the following deficits and impairments:  Decreased range of motion, Impaired sensation, Postural dysfunction, Decreased balance, Impaired flexibility, Hypomobility, Pain, Increased muscle spasms  Visit Diagnosis: Pain in right hip  Chronic right-sided low back pain without sciatica  Cramp and spasm     Problem List Patient Active Problem List   Diagnosis Date Noted   OA (osteoarthritis) of knee 03/03/2020   Murmur, cardiac 05/08/2014   RBBB 05/08/2014   Right inguinal hernia 03/06/2013   Abscess of right buttock 11/23/2012   Ruben Im, PT 09/29/21 10:55 AM Phone: [phone] Fax: [phone]  Alvera Singh, PT 09/29/2021, 10:54 AM  [facility] @ [place], [place], [address] Phone: [phone]   Fax:  [phone]  Name: Jesse [name] MRN: [id number] Date of Birth: June 11, 1951

## 2021-10-02 ENCOUNTER — Ambulatory Visit: Payer: Medicare PPO | Admitting: Physical Therapy

## 2021-10-02 ENCOUNTER — Other Ambulatory Visit: Payer: Self-pay

## 2021-10-02 DIAGNOSIS — M545 Low back pain, unspecified: Secondary | ICD-10-CM | POA: Diagnosis not present

## 2021-10-02 DIAGNOSIS — M25551 Pain in right hip: Secondary | ICD-10-CM | POA: Diagnosis not present

## 2021-10-02 DIAGNOSIS — R252 Cramp and spasm: Secondary | ICD-10-CM | POA: Diagnosis not present

## 2021-10-02 DIAGNOSIS — G8929 Other chronic pain: Secondary | ICD-10-CM | POA: Diagnosis not present

## 2021-10-02 NOTE — Therapy (Signed)
[facility] @ [address] [place], [place], [address] Phone: [phone]   Fax:  [phone]  Physical Therapy Treatment  Patient Details  Name: Jesse [name] MRN: [id number] Date of Birth: 1950/12/16 Referring Provider (PT): London Pepper, MD   Encounter Date: 10/02/2021   PT End of Session - 10/02/21 1340     Visit Number 11    Date for PT Re-Evaluation 10/07/21    Authorization Type [employer] until 12/31    Authorization Time Period 16 visits approved 08/12/21 through 10/06/21    Authorization - Visit Number 11    Authorization - Number of Visits 16    Progress Note Due on Visit 20    PT Start Time 0930    PT Stop Time 4403    PT Time Calculation (min) 45 min    Activity Tolerance Patient tolerated treatment well             Past Medical History:  Diagnosis Date   Arthritis    knees   Asthma    seasonal   Eczema    GERD (gastroesophageal reflux disease)    HLD (hyperlipidemia)    HTN (hypertension)    Pulmonary emboli ([place])    post op knee scope for a few months   Sleep apnea 2004   TA (temporal arteritis) ([place])    Wears glasses     Past Surgical History:  Procedure Laterality Date   ABCESS DRAINAGE  02/22/2013   i/d-rectal   COLONOSCOPY  06/2015   No polyps, done for family history of colon polyps Next would be 2026   EYE SURGERY Left age 70   lazy eye   INGUINAL HERNIA REPAIR Right 05/25/2013   Procedure: RIGHT INGUINAL HERNIA REPAIR  WITH MESH;  Surgeon: Imogene Burn. Georgette Dover, MD;  Location: [facility];  Service: General;  Laterality: Right;   INSERTION OF MESH Right 05/25/2013   Procedure: INSERTION OF MESH;  Surgeon: Imogene Burn. Georgette Dover, MD;  Location: [facility];  Service: General;  Laterality: Right;   NASAL SEPTUM SURGERY  10/26/2003   TONSILLECTOMY  70 years old   TOTAL KNEE ARTHROPLASTY Right 10/25/2010   rt total knee   TOTAL KNEE ARTHROPLASTY Left  03/03/2020   Procedure: TOTAL KNEE ARTHROPLASTY;  Surgeon: Gaynelle Arabian, MD;  Location: [facility];  Service: Orthopedics;  Laterality: Left;  23min   TRACHEAL SURGERY  10/26/1983   emergency trach from throat infection   UMBILICAL HERNIA REPAIR  10/26/1999   WISDOM TOOTH EXTRACTION      There were no vitals filed for this visit.   Subjective Assessment - 10/02/21 0939     Subjective Feeling a little stiff this morning.  Earlier with mild discomfort lateral right hip.     Pertinent History PSH: bil TKR 2021  PMH: HLD, HTN, sleep apnea    Patient Stated Goals get rid of symptoms - can still play golf but takes Aleve    Currently in Pain? No/denies    Pain Score 0-No pain                               [facility] Adult PT Treatment/Exercise - 10/02/21 0001       Lumbar Exercises: Standing   Other Standing Lumbar Exercises 7# snatch and press overhead 10x right/left    Other Standing Lumbar Exercises 2 7# dead lifts 10x  Knee/Hip Exercises: Machines for Strengthening   Other Machine hip machine 30# hip abduction; 45# hip extension and hip flexion 15x each right/left      Knee/Hip Exercises: Standing   Other Standing Knee Exercises dynamic warm up: high step, backwards walk, toe walk with UE raise      Moist Heat Therapy   Number Minutes Moist Heat 3 Minutes    Moist Heat Location Hip      Manual Therapy   Manual therapy comments piriformis stretch 5x    Joint Mobilization right hip inferior distract, AP in internal rotation grade 3 30 sec each    Soft tissue mobilization right gluteals              Trigger Point Dry Needling - 10/02/21 0001     Consent Given? Yes    Dry Needling Comments right side only    Gluteus Minimus Response Palpable increased muscle length    Gluteus Medius Response Palpable increased muscle length    Gluteus Maximus Response Palpable increased muscle length                     PT Short Term Goals - 08/26/21  1027       PT SHORT TERM GOAL #1   Title Pt ind with initial HEP    Status Achieved      PT SHORT TERM GOAL #2   Title Pt will report improved sensation along Rt posterior, lateral and medial hip by at least 25%    Status Achieved               PT Long Term Goals - 09/29/21 1052       PT LONG TERM GOAL #1   Title Pt will be ind with advanced HEP and understand how to progress safely    Time 8    Period Weeks    Status On-going      PT LONG TERM GOAL #2   Title Pt will report improved sensation along Rt hip, buttock, and groin by at least 70%    Time 8    Period Weeks    Status On-going      PT LONG TERM GOAL #3   Title Pt will demo bil LE flexibility and Rt QL length WFL to optimize pelvic and trunk mobility    Status Achieved      PT LONG TERM GOAL #4   Title Pt will be able to demo SLS on Rt and Lt LE without UE support x 20 sec to demo improved closed chain stability and strength for dynamic tasks.    Time 8    Period Weeks    Status On-going                   Plan - 10/02/21 1341     Clinical Impression Statement The patient reports mild lateral right hip pain today and is receptive to DN in conjunction with manual therapy to address taut bands and tender points.  Continued progression of hip strengthening targeting the gluteals with weakness noted particularly in glute medius muscle.  Good carryover with hip hinge technique with snatch/press and dead lifts. Therapist monitoring response throughout treatment session.    Personal Factors and Comorbidities Age;Time since onset of injury/illness/exacerbation;Comorbidity 1;Comorbidity 2;Comorbidity 3+    Comorbidities HTN, HLD, sleep apnea, chronic LBP    Rehab Potential Good    PT Frequency 2x / week    PT Duration 8 weeks  PT Treatment/Interventions ADLs/Self Care Home Management;Aquatic Therapy;Cryotherapy;Moist Heat;Iontophoresis 4mg /ml Dexamethasone;Electrical Stimulation;Neuromuscular  re-education;Balance training;Therapeutic exercise;Therapeutic activities;Functional mobility training;Patient/family education;Manual techniques;Dry needling;Passive range of motion;Taping;Spinal Manipulations;Joint Manipulations    PT Next Visit Plan check progress toward goals next visit;  check response to DN of right gluteals;  hip machine; dead lifting progression 30#; Pallof series; side planks; snatch and press;  farmer carries in left hand; SLS; step downs; modified planks;  right LE strength quads and glutes    PT Home Exercise Plan Access Code: [place]             Patient will benefit from skilled therapeutic intervention in order to improve the following deficits and impairments:  Decreased range of motion, Impaired sensation, Postural dysfunction, Decreased balance, Impaired flexibility, Hypomobility, Pain, Increased muscle spasms  Visit Diagnosis: Pain in right hip  Chronic right-sided low back pain without sciatica  Cramp and spasm     Problem List Patient Active Problem List   Diagnosis Date Noted   OA (osteoarthritis) of knee 03/03/2020   Murmur, cardiac 05/08/2014   RBBB 05/08/2014   Right inguinal hernia 03/06/2013   Abscess of right buttock 11/23/2012   Ruben Im, PT 10/02/21 1:46 PM Phone: [phone] Fax: [phone]  Alvera Singh, PT 10/02/2021, 1:45 PM  [facility] @ [address] [place], [place], [address] Phone: [phone]   Fax:  [phone]  Name: Jesse [name] MRN: [id number] Date of Birth: May 08, 1951

## 2021-10-05 ENCOUNTER — Other Ambulatory Visit: Payer: Self-pay

## 2021-10-05 ENCOUNTER — Ambulatory Visit (INDEPENDENT_AMBULATORY_CARE_PROVIDER_SITE_OTHER): Payer: Medicare PPO

## 2021-10-05 DIAGNOSIS — B351 Tinea unguium: Secondary | ICD-10-CM

## 2021-10-05 NOTE — Progress Notes (Signed)
Patient presents today for the 1st laser treatment. Diagnosed with mycotic nail infection by Dr. Cannon Kettle.   Toenail most affected bilateral 1st.  All other systems are negative.  Nails were filed thin. Laser therapy was administered to bilateral 1-5 toenails  and patient tolerated the treatment well. All safety precautions were in place.    Follow up in 4 weeks for laser # 2.

## 2021-10-05 NOTE — Patient Instructions (Signed)

## 2021-10-09 ENCOUNTER — Ambulatory Visit: Payer: Medicare PPO | Admitting: Physical Therapy

## 2021-10-09 ENCOUNTER — Other Ambulatory Visit: Payer: Self-pay

## 2021-10-09 DIAGNOSIS — M25551 Pain in right hip: Secondary | ICD-10-CM | POA: Diagnosis not present

## 2021-10-09 DIAGNOSIS — G8929 Other chronic pain: Secondary | ICD-10-CM

## 2021-10-09 DIAGNOSIS — M545 Low back pain, unspecified: Secondary | ICD-10-CM | POA: Diagnosis not present

## 2021-10-09 DIAGNOSIS — R252 Cramp and spasm: Secondary | ICD-10-CM

## 2021-10-09 NOTE — Therapy (Signed)
[facility] @ [place], [place], [address] Phone: [phone]   Fax:  [phone]  Physical Therapy Treatment/Recertification/Discharge Summary   Patient Details  Name: Jesse [name] MRN: [id number] Date of Birth: 05/04/1951 Referring Provider (PT): London Pepper, MD   Encounter Date: 10/09/2021   PT End of Session - 10/09/21 1232     Visit Number 12    Date for PT Re-Evaluation 10/09/21    Authorization Type [employer] until 12/31    Authorization Time Period 16 visits approved 08/12/21 through 10/06/21    Authorization - Visit Number 12    Authorization - Number of Visits 16    PT Start Time 0842    PT Stop Time 0925    PT Time Calculation (min) 43 min    Activity Tolerance Patient tolerated treatment well             Past Medical History:  Diagnosis Date   Arthritis    knees   Asthma    seasonal   Eczema    GERD (gastroesophageal reflux disease)    HLD (hyperlipidemia)    HTN (hypertension)    Pulmonary emboli ([place])    post op knee scope for a few months   Sleep apnea 2004   TA (temporal arteritis) ([place])    Wears glasses     Past Surgical History:  Procedure Laterality Date   ABCESS DRAINAGE  02/22/2013   i/d-rectal   COLONOSCOPY  06/2015   No polyps, done for family history of colon polyps Next would be 2026   EYE SURGERY Left age 29   lazy eye   INGUINAL HERNIA REPAIR Right 05/25/2013   Procedure: RIGHT INGUINAL HERNIA REPAIR  WITH MESH;  Surgeon: Imogene Burn. Georgette Dover, MD;  Location: [facility];  Service: General;  Laterality: Right;   INSERTION OF MESH Right 05/25/2013   Procedure: INSERTION OF MESH;  Surgeon: Imogene Burn. Georgette Dover, MD;  Location: [facility];  Service: General;  Laterality: Right;   NASAL SEPTUM SURGERY  10/26/2003   TONSILLECTOMY  70 years old   TOTAL KNEE ARTHROPLASTY Right 10/25/2010   rt total knee   TOTAL KNEE ARTHROPLASTY Left  03/03/2020   Procedure: TOTAL KNEE ARTHROPLASTY;  Surgeon: Gaynelle Arabian, MD;  Location: [facility];  Service: Orthopedics;  Laterality: Left;  7mn   TRACHEAL SURGERY  10/26/1983   emergency trach from throat infection   UMBILICAL HERNIA REPAIR  10/26/1999   WISDOM TOOTH EXTRACTION      There were no vitals filed for this visit.   Subjective Assessment - 10/09/21 0840     Subjective I'm about 85% better.  When I used to get up in the morning it took 15 min for pain to subside but now I'm not in pain.  I want to get some muscle tone.    Pertinent History PSH: bil TKR 2021  PMH: HLD, HTN, sleep apnea    Patient Stated Goals get rid of symptoms - can still play golf but takes Aleve    Currently in Pain? No/denies    Pain Score 0-No pain    Pain Location Back                [facility] 10[facility]21 0001       Assessment   Medical Diagnosis R20.0 (ICD-10-CM) - Anesthesia of skin    Referring Provider (PT) MLondon Pepper MD    Onset Date/Surgical Date --  approx 1 year ago   Hand Dominance Right    Prior Therapy yes at different facility      Single Leg Stance   Comments right SLS 25 sec; left 10 sec      AROM   Overall AROM Comments trunk ROM WFL with stiffness Rt lumbar end range left SB    Lumbar Flexion 70    Lumbar Extension 30    Lumbar - Right Side Bend 35    Lumbar - Left Side Bend 35      PROM   Overall PROM Comments left external rotation WFLs                           [facility] Adult PT Treatment/Exercise - 10/09/21 0001       Lumbar Exercises: Aerobic   Nustep L5 6 min while discussing status      Lumbar Exercises: Standing   Row Limitations 10# snatch and press overhead 10x right/left    Other Standing Lumbar Exercises Pallof red band kickstand ABCs right and left. kickstand press out, press up, marching, retro lunge    Other Standing Lumbar Exercises 2 10# dead lifts 10x      Lumbar Exercises: Sidelying   Other Sidelying Lumbar  Exercises side planks 3x right/left                       PT Short Term Goals - 10/09/21 1256       PT SHORT TERM GOAL #1   Title Pt ind with initial HEP    Status Achieved      PT SHORT TERM GOAL #2   Title Pt will report improved sensation along Rt posterior, lateral and medial hip by at least 25%    Status Achieved               PT Long Term Goals - 10/09/21 0913       PT LONG TERM GOAL #1   Title Pt will be ind with advanced HEP and understand how to progress safely    Status Achieved      PT LONG TERM GOAL #2   Title Pt will report improved sensation along Rt hip, buttock, and groin by at least 70%    Status Achieved      PT LONG TERM GOAL #3   Title Pt will demo bil LE flexibility and Rt QL length WFL to optimize pelvic and trunk mobility    Status Achieved      PT LONG TERM GOAL #4   Title Pt will be able to demo SLS on Rt and Lt LE without UE support x 20 sec to demo improved closed chain stability and strength for dynamic tasks.    Status Partially Met                   Plan - 10/09/21 1233     Clinical Impression Statement The patient has made excellent progress with PT and self rates his progress at 85%.  Much improved soft tissue and hip joint mobility of lumbar spine and hips with ROM WNLs.  He responded well to manual therapy and DN and progressed well with lumbo/pelvic/hip strengthening.  He has a comprehensive HEP including dead lifting and other loaded progressions.  All goals have been met and the patient expresses readiness for independent performance of his ex program.    Personal Factors and Comorbidities Age;Time since onset  of injury/illness/exacerbation;Comorbidity 1;Comorbidity 2;Comorbidity 3+    Comorbidities HTN, HLD, sleep apnea, chronic LBP    Rehab Potential Good    PT Frequency 2x / week    PT Duration 8 weeks    PT Treatment/Interventions ADLs/Self Care Home Management;Aquatic Therapy;Cryotherapy;Moist  Heat;Iontophoresis 18m/ml Dexamethasone;Electrical Stimulation;Neuromuscular re-education;Balance training;Therapeutic exercise;Therapeutic activities;Functional mobility training;Patient/family education;Manual techniques;Dry needling;Passive range of motion;Taping;Spinal Manipulations;Joint Manipulations    PT Next Visit Plan discharge            PHYSICAL THERAPY DISCHARGE SUMMARY  Visits from Start of Care: 12  Current functional level related to goals / functional outcomes: See clinical impressions above   Remaining deficits: As above    Education / Equipment: HEP   Patient agrees to discharge. Patient goals were met. Patient is being discharged due to meeting the stated rehab goals.  Patient will benefit from skilled therapeutic intervention in order to improve the following deficits and impairments:  Decreased range of motion, Impaired sensation, Postural dysfunction, Decreased balance, Impaired flexibility, Hypomobility, Pain, Increased muscle spasms  Visit Diagnosis: Pain in right hip - Plan: PT plan of care cert/re-cert  Chronic right-sided low back pain without sciatica - Plan: PT plan of care cert/re-cert  Cramp and spasm - Plan: PT plan of care cert/re-cert     Problem List Patient Active Problem List   Diagnosis Date Noted   OA (osteoarthritis) of knee 03/03/2020   Murmur, cardiac 05/08/2014   RBBB 05/08/2014   Right inguinal hernia 03/06/2013   Abscess of right buttock 11/23/2012   SRuben Im PT 10/09/21 12:59 PM Phone: [phone]: [phone] SAlvera Singh PT 10/09/2021, 12:58 PM  [email or web address] [address]Phone: [phone]  Fax:  [phone] Name: Jesse KOUBAMRN: [id number] of Birth: 8April 08, 1952

## 2021-10-23 IMAGING — CR DG CHEST 2V
2 series · 2 of 2 positions shown · non-contrast
Comparison: 02/25/2016

CLINICAL DATA: Left-sided chest pain for 1 week. Asthma.
Hypertension.

EXAM:
CHEST - 2 VIEW

[w chest pa]
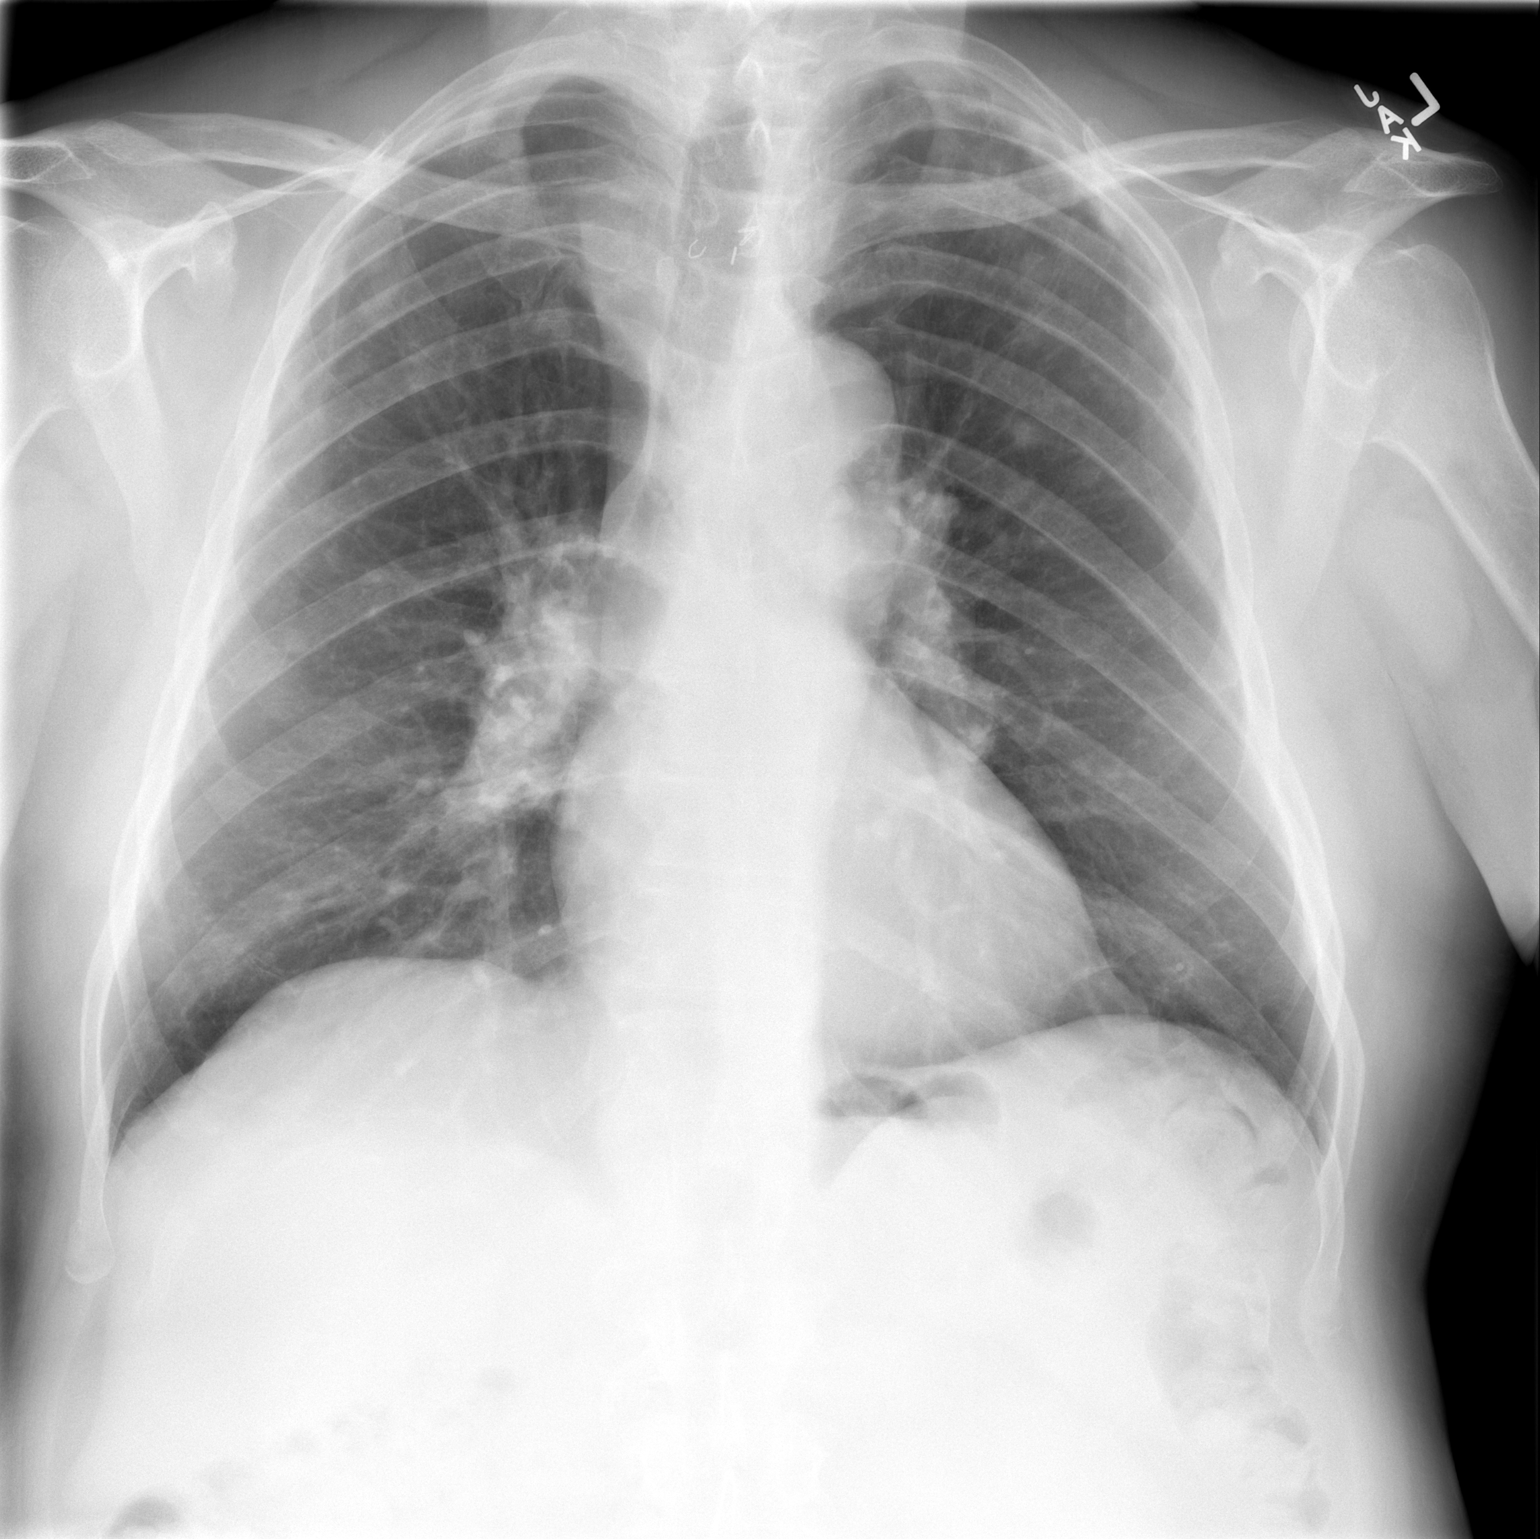

[w chest lat]
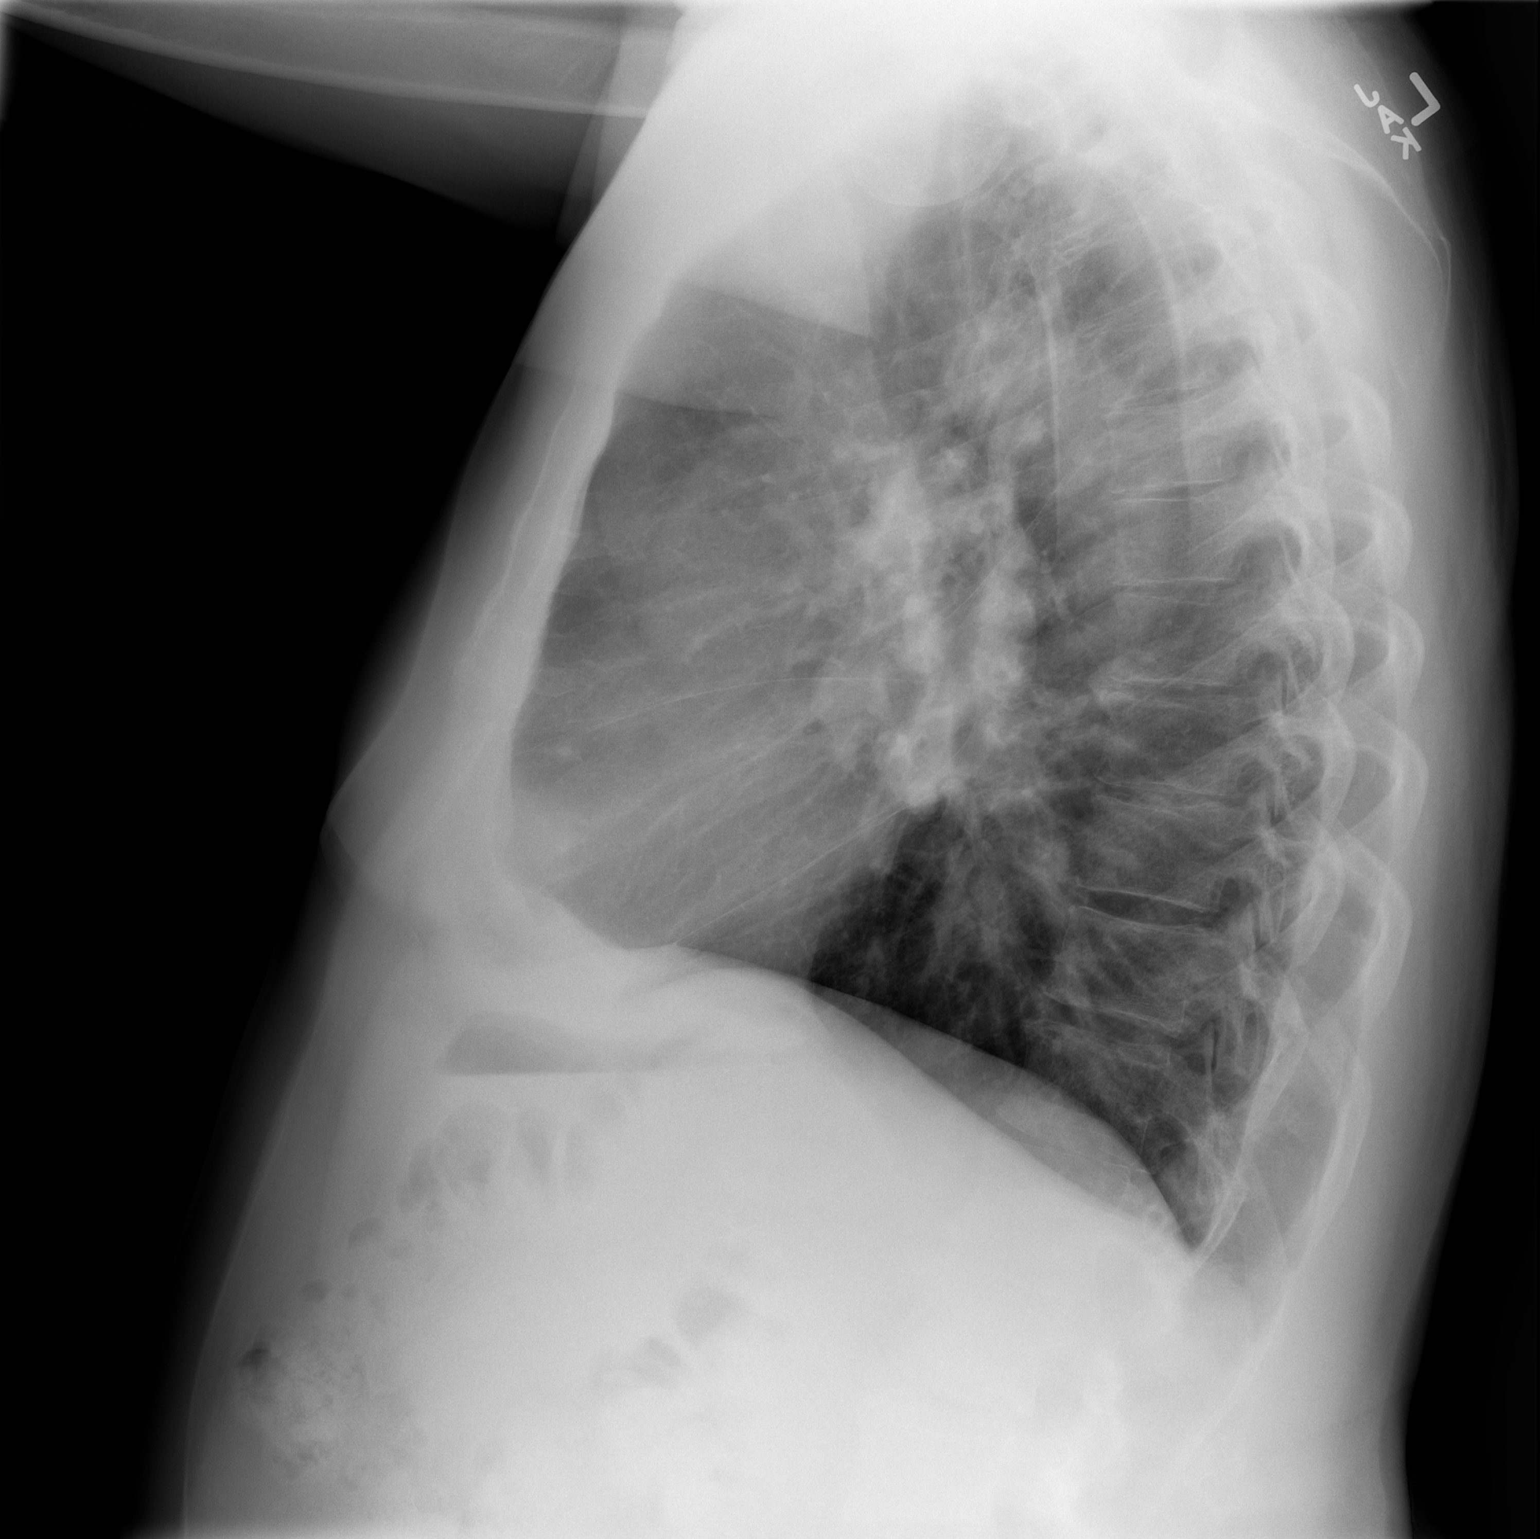

[2 of 2 positions shown; findings below may reference images not displayed]

FINDINGS: The heart size and mediastinal contours are within normal limits.
Several small nodular densities in both upper lobes remains stable,
consistent with benign etiology. No evidence of pulmonary infiltrate
or pleural effusion. Several surgical clips again seen at the level
of the thoracic inlet. The visualized skeletal structures are
unremarkable.
IMPRESSION: Stable exam.  No active cardiopulmonary disease.

## 2021-11-05 ENCOUNTER — Ambulatory Visit: Payer: Medicare PPO | Admitting: Sports Medicine

## 2021-11-05 ENCOUNTER — Other Ambulatory Visit: Payer: Self-pay

## 2021-11-05 DIAGNOSIS — G8929 Other chronic pain: Secondary | ICD-10-CM

## 2021-11-05 DIAGNOSIS — M722 Plantar fascial fibromatosis: Secondary | ICD-10-CM | POA: Diagnosis not present

## 2021-11-05 DIAGNOSIS — M79672 Pain in left foot: Secondary | ICD-10-CM

## 2021-11-05 MED ORDER — TRIAMCINOLONE ACETONIDE 10 MG/ML IJ SUSP
10.0000 mg | Freq: Once | INTRAMUSCULAR | Status: AC
  Administered 2021-11-05: 10 mg

## 2021-11-05 NOTE — Progress Notes (Signed)
Subjective: Jesse [name] is a 71 y.o. male patient presents to office with complaint of recurrent pain to the heel.  Patient reports that he thinks previous injection has helped but has noticed that he has some pain in the bottom of the left heel when he is standing to walk barefooted.  Patient also also still going for laser treatments and has a scheduled treatment tomorrow for his nail fungus.  Patient denies any other pedal complaints at this time.  Patient Active Problem List   Diagnosis Date Noted   OA (osteoarthritis) of knee 03/03/2020   Murmur, cardiac 05/08/2014   RBBB 05/08/2014   Right inguinal hernia 03/06/2013   Abscess of right buttock 11/23/2012    Current Outpatient Medications on File Prior to Visit  Medication Sig Dispense Refill   albuterol (PROVENTIL HFA;VENTOLIN HFA) 108 (90 BASE) MCG/ACT inhaler Inhale 2 puffs into the lungs every 6 (six) hours as needed for wheezing.      amoxicillin (AMOXIL) 500 MG tablet Take 2,000 mg by mouth See admin instructions. Take 2000 mg 1 hour prior to dental work (Patient not taking: Reported on 05/28/2021)  1   aspirin EC 81 MG tablet Take 81 mg by mouth daily. Swallow whole.     atorvastatin (LIPITOR) 10 MG tablet Take 10 mg by mouth daily.     Brimonidine Tartrate (LUMIFY) 0.025 % SOLN Place 1 drop into both eyes daily as needed (redness).     cetirizine (ZYRTEC) 10 MG tablet Take 10 mg by mouth daily.     clobetasol ointment (TEMOVATE) 0.05 % clobetasol 0.05 % topical ointment  APPLY TO HANDS TWICE A DAY OR AS MUCH AS YOU WASH HANDS FOR 2 WEEKS     fluocinonide cream (LIDEX) 2.22 % Apply 1 application topically 2 (two) times daily as needed (outbreaks).     fluticasone (FLOVENT HFA) 110 MCG/ACT inhaler Inhale 1 puff into the lungs daily as needed (allergies).      hydrochlorothiazide (HYDRODIURIL) 25 MG tablet Take 25 mg by mouth daily.     naproxen sodium (ALEVE) 220 MG tablet 1 tablet with food or milk as needed     sildenafil  (REVATIO) 20 MG tablet Take 60 mg by mouth daily as needed for erectile dysfunction.     No current facility-administered medications on file prior to visit.    Allergies  Allergen Reactions   Adhesive [Tape] Other (See Comments)    Skin bumps use paper tape   Oxycodone Itching    Objective: Physical Exam General: The patient is alert and oriented x3 in no acute distress.  Dermatology: Skin is warm, dry and supple bilateral lower extremities. Nails 1-10 are minimally thickened with discoloration/hyperpigmentation to multiple nails worse at bilateral hallux that is slowly improving with use of laser.  There is no erythema, edema, no eccymosis, no open lesions present. Integument is otherwise unremarkable.  Vascular: Dorsalis Pedis pulse and Posterior Tibial pulse are 2/4 bilateral. Capillary fill time is immediate to all digits.  Neurological: Grossly intact to light touch bilateral.   Musculoskeletal: Minimal tenderness to palpation at the central greater than medial calcaneal tubercale and through the insertion of the plantar fascia on the left foot. There is no pain to palpation to the plantar fascial insertion on the right.  There is limited ankle joint range of motion bilateral.  Pes planus foot type.   Assessment and Plan: Problem List Items Addressed This Visit   None Visit Diagnoses     Plantar fasciitis,  left    -  Primary   Relevant Medications   triamcinolone acetonide (KENALOG) 10 MG/ML injection 10 mg (Completed) (Start on 11/05/2021 12:45 PM)   Chronic heel pain, left       Relevant Medications   naproxen sodium (ALEVE) 220 MG tablet   triamcinolone acetonide (KENALOG) 10 MG/ML injection 10 mg (Completed) (Start on 11/05/2021 12:45 PM)       -Complete examination performed.  -Re-Discussed with patient in detail the condition of plantar fasciitis, how this occurs and general treatment options. Explained both conservative and surgical treatments.  After oral  consent and aseptic prep, injected a mixture containing 1 ml of 2%  plain lidocaine, 1 ml 0.5% plain marcaine, 0.5 ml of kenalog 10 and 0.5 ml of dexamethasone phosphate into left heel at the plantar fascial insertion via a plantar central approach without complication. Post-injection care discussed with patient.  -Recommended good supportive shoes and advised use of left plantar fascial braces consistently for the next 2 weeks and then may slowly wean from braces as directed -Continue with daily stretching and icing as previously recommended -Continue with follow-up with laser for nail fungus -Patient to return in 4 to 6 weeks for follow-up evaluation on left heel or sooner if problems or issues arise  Landis Martins, DPM

## 2021-11-06 ENCOUNTER — Ambulatory Visit (INDEPENDENT_AMBULATORY_CARE_PROVIDER_SITE_OTHER): Payer: Medicare PPO

## 2021-11-06 DIAGNOSIS — B351 Tinea unguium: Secondary | ICD-10-CM

## 2021-11-06 NOTE — Progress Notes (Signed)
Patient presents today for the 2nd laser treatment. Diagnosed with mycotic nail infection by Dr. Cannon Kettle.   Toenail most affected bilateral 1st.  All other systems are negative.  Nails were filed thin. Laser therapy was administered to bilateral 1-5 toenails  and patient tolerated the treatment well. All safety precautions were in place.    Follow up in 4 weeks for laser # 3.

## 2021-11-19 DIAGNOSIS — L603 Nail dystrophy: Secondary | ICD-10-CM | POA: Diagnosis not present

## 2021-11-19 DIAGNOSIS — L80 Vitiligo: Secondary | ICD-10-CM | POA: Diagnosis not present

## 2021-11-19 DIAGNOSIS — L2089 Other atopic dermatitis: Secondary | ICD-10-CM | POA: Diagnosis not present

## 2021-11-19 DIAGNOSIS — L308 Other specified dermatitis: Secondary | ICD-10-CM | POA: Diagnosis not present

## 2021-11-19 DIAGNOSIS — L218 Other seborrheic dermatitis: Secondary | ICD-10-CM | POA: Diagnosis not present

## 2021-12-01 DIAGNOSIS — L2089 Other atopic dermatitis: Secondary | ICD-10-CM | POA: Diagnosis not present

## 2021-12-04 ENCOUNTER — Other Ambulatory Visit: Payer: Medicare PPO

## 2021-12-10 ENCOUNTER — Ambulatory Visit: Payer: Medicare PPO | Admitting: Sports Medicine

## 2022-01-01 ENCOUNTER — Other Ambulatory Visit: Payer: Self-pay

## 2022-01-01 ENCOUNTER — Ambulatory Visit (INDEPENDENT_AMBULATORY_CARE_PROVIDER_SITE_OTHER): Payer: Medicare PPO | Admitting: *Deleted

## 2022-01-01 DIAGNOSIS — B351 Tinea unguium: Secondary | ICD-10-CM

## 2022-01-01 NOTE — Progress Notes (Signed)
Patient presents today for the 3rd laser treatment. Diagnosed with mycotic nail infection by Dr. Cannon Kettle.  ? ?Toenail most affected bilateral hallux. The hallux nails are looking better ? ?All other systems are negative. ? ?Nails were filed thin. Laser therapy was administered to bilateral 1-5 toenails  and patient tolerated the treatment well. All safety precautions were in place.  ? ? ?Follow up in 6 weeks for laser # 4. ? ? ?

## 2022-01-07 ENCOUNTER — Other Ambulatory Visit: Payer: Self-pay

## 2022-01-07 ENCOUNTER — Ambulatory Visit: Payer: Medicare PPO | Admitting: Sports Medicine

## 2022-01-07 DIAGNOSIS — G8929 Other chronic pain: Secondary | ICD-10-CM | POA: Diagnosis not present

## 2022-01-07 DIAGNOSIS — M722 Plantar fascial fibromatosis: Secondary | ICD-10-CM | POA: Diagnosis not present

## 2022-01-07 DIAGNOSIS — M79672 Pain in left foot: Secondary | ICD-10-CM | POA: Diagnosis not present

## 2022-01-07 DIAGNOSIS — B351 Tinea unguium: Secondary | ICD-10-CM | POA: Diagnosis not present

## 2022-01-07 NOTE — Progress Notes (Signed)
Subjective: [name] is a 71 y.o. male patient returns for check up of left heel pai. Reports that it is getting better still doing stretches and icing.   Patient also also still going for laser treatments and has a scheduled for his nail fungus and states that he feels like things are improving.  Patient denies any other pedal complaints at this time. ? ?Patient Active Problem List  ? Diagnosis Date Noted  ? OA (osteoarthritis) of knee 03/03/2020  ? Murmur, cardiac 05/08/2014  ? RBBB 05/08/2014  ? Right inguinal hernia 03/06/2013  ? Abscess of right buttock 11/23/2012  ? ? ?Current Outpatient Medications on File Prior to Visit  ?Medication Sig Dispense Refill  ? albuterol (PROVENTIL HFA;VENTOLIN HFA) 108 (90 BASE) MCG/ACT inhaler Inhale 2 puffs into the lungs every 6 (six) hours as needed for wheezing.     ? amoxicillin (AMOXIL) 500 MG tablet Take 2,000 mg by mouth See admin instructions. Take 2000 mg 1 hour prior to dental work (Patient not taking: Reported on 05/28/2021)  1  ? aspirin EC 81 MG tablet Take 81 mg by mouth daily. Swallow whole.    ? atorvastatin (LIPITOR) 10 MG tablet Take 10 mg by mouth daily.    ? Brimonidine Tartrate (LUMIFY) 0.025 % SOLN Place 1 drop into both eyes daily as needed (redness).    ? cetirizine (ZYRTEC) 10 MG tablet Take 10 mg by mouth daily.    ? clobetasol ointment (TEMOVATE) 0.05 % clobetasol 0.05 % topical ointment ? APPLY TO HANDS TWICE A DAY OR AS MUCH AS YOU [place] HANDS FOR 2 WEEKS    ? fluocinonide cream (LIDEX) 7.16 % Apply 1 application topically 2 (two) times daily as needed (outbreaks).    ? fluticasone (FLOVENT HFA) 110 MCG/ACT inhaler Inhale 1 puff into the lungs daily as needed (allergies).     ? hydrochlorothiazide (HYDRODIURIL) 25 MG tablet Take 25 mg by mouth daily.    ? hydrOXYzine (ATARAX) 10 MG tablet Take by mouth.    ? levocetirizine (XYZAL) 5 MG tablet Take by mouth.    ? naproxen sodium (ALEVE) 220 MG tablet 1 tablet with food or milk as needed    ?  sildenafil (REVATIO) 20 MG tablet Take 60 mg by mouth daily as needed for erectile dysfunction.    ? ?No current facility-administered medications on file prior to visit.  ? ? ?Allergies  ?Allergen Reactions  ? Adhesive [Tape] Other (See Comments)  ?  Skin bumps use paper tape  ? Oxycodone Itching  ? ? ?Objective: ?Physical Exam ?General: The patient is alert and oriented x3 in no acute distress. ? ?Dermatology: Skin is warm, dry and supple bilateral lower extremities. Nails 1-10 are minimally thickened with discoloration/hyperpigmentation to multiple nails worse at bilateral hallux that is slowly improving with use of laser as previously noted.  There is no erythema, no edema, no eccymosis, no open lesions present. Integument is otherwise unremarkable. ? ?Vascular: Dorsalis Pedis pulse and Posterior Tibial pulse are 2/4 bilateral. Capillary fill time is immediate to all digits. ? ?Neurological: Grossly intact to light touch bilateral.  ? ?Musculoskeletal: No significant reproducible tenderness to palpation at the calcaneal tubercale and through the insertion of the plantar fascia on the left foot. + pes planus foot type. ? ? ?Assessment and Plan: ?Problem List Items Addressed This Visit   ?None ?Visit Diagnoses   ? ? Plantar fasciitis, left    -  Primary  ? Chronic heel pain, left      ?  Nail fungus      ? ?  ? ? ?-Complete examination performed.  ?-Re-Discussed continued care of Plantar fasciitis that is improving ?-At this time since pain is minimal does not need another injection ?-Discussed the importance of daily stretching to prevent reoccurrence ?-Discussed the importance of good shoe gear that provide support to prevent reoccurrence ?-Advised patient as long as there is still a small amount of tenderness may continue with icing as directed ?-Patient to continue with follow-up as scheduled for treatment for nail fungus currently undergoing laser treatments ?-Return to office if heel flares back up or sooner  if problems or issues arise ? ?Landis Martins, DPM ? ? ?

## 2022-01-19 DIAGNOSIS — R42 Dizziness and giddiness: Secondary | ICD-10-CM | POA: Diagnosis not present

## 2022-01-29 DIAGNOSIS — M7061 Trochanteric bursitis, right hip: Secondary | ICD-10-CM | POA: Diagnosis not present

## 2022-02-12 ENCOUNTER — Other Ambulatory Visit: Payer: Medicare PPO

## 2022-02-16 ENCOUNTER — Encounter: Payer: Self-pay | Admitting: Physical Therapy

## 2022-02-16 ENCOUNTER — Ambulatory Visit: Payer: Medicare PPO | Attending: Specialist | Admitting: Physical Therapy

## 2022-02-16 DIAGNOSIS — M5451 Vertebrogenic low back pain: Secondary | ICD-10-CM | POA: Diagnosis not present

## 2022-02-16 DIAGNOSIS — M5136 Other intervertebral disc degeneration, lumbar region: Secondary | ICD-10-CM | POA: Insufficient documentation

## 2022-02-16 DIAGNOSIS — M545 Low back pain, unspecified: Secondary | ICD-10-CM

## 2022-02-16 DIAGNOSIS — M6281 Muscle weakness (generalized): Secondary | ICD-10-CM

## 2022-02-16 NOTE — Therapy (Signed)
?OUTPATIENT PHYSICAL THERAPY THORACOLUMBAR EVALUATION ? ? ?Patient Name: Jesse [name] ?MRN: [id number] ?DOB:[birth year], 71 y.o., male ?Today's Date: 02/16/2022 ? ? PT End of Session - 02/16/22 1533   ? ? Visit Number 1   ? Date for PT Re-Evaluation 04/13/22   ? Authorization Type BCBS; [employer]   ? PT Start Time 1530   ? PT Stop Time 1610   ? PT Time Calculation (min) 40 min   ? Activity Tolerance Patient tolerated treatment well   ? ?  ?  ? ?  ? ? ?Past Medical History:  ?Diagnosis Date  ? Arthritis   ? knees  ? Asthma   ? seasonal  ? Eczema   ? GERD (gastroesophageal reflux disease)   ? HLD (hyperlipidemia)   ? HTN (hypertension)   ? Pulmonary emboli ([place])   ? post op knee scope for a few months  ? Sleep apnea 2004  ? TA (temporal arteritis) ([place])   ? Wears glasses   ? ?Past Surgical History:  ?Procedure Laterality Date  ? ABCESS DRAINAGE  02/22/2013  ? i/d-rectal  ? COLONOSCOPY  06/2015  ? No polyps, done for family history of colon polyps Next would be 2026  ? EYE SURGERY Left age 50  ? lazy eye  ? INGUINAL HERNIA REPAIR Right 05/25/2013  ? Procedure: RIGHT INGUINAL HERNIA REPAIR  WITH MESH;  Surgeon: Imogene Burn. Georgette Dover, MD;  Location: [facility];  Service: General;  Laterality: Right;  ? INSERTION OF MESH Right 05/25/2013  ? Procedure: INSERTION OF MESH;  Surgeon: Imogene Burn. Georgette Dover, MD;  Location: [facility];  Service: General;  Laterality: Right;  ? NASAL SEPTUM SURGERY  10/26/2003  ? TONSILLECTOMY  71 years old  ? TOTAL KNEE ARTHROPLASTY Right 10/25/2010  ? rt total knee  ? TOTAL KNEE ARTHROPLASTY Left 03/03/2020  ? Procedure: TOTAL KNEE ARTHROPLASTY;  Surgeon: Gaynelle Arabian, MD;  Location: [facility];  Service: Orthopedics;  Laterality: Left;  33mn  ? TRACHEAL SURGERY  10/26/1983  ? emergency trach from throat infection  ? UMBILICAL HERNIA REPAIR  10/26/1999  ? WISDOM TOOTH EXTRACTION    ? ?Patient Active Problem List  ? Diagnosis Date Noted  ? OA (osteoarthritis) of knee 03/03/2020  ?  Murmur, cardiac 05/08/2014  ? RBBB 05/08/2014  ? Right inguinal hernia 03/06/2013  ? Abscess of right buttock 11/23/2012  ? ? ?PCP: MLondon Pepper MD ? ?REFERRING PROVIDER: Dr. JSusa Day? ?REFERRING DIAG: M51.26 degenerative disc disease  ? ?THERAPY DIAG: back pain ? ?ONSET DATE: 11/25/21 ? ?SUBJECTIVE:                                                                                                                                                                                          ? ?  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 ?PAIN:  ?Are you having pain? No ?NPRS scale: 0/10 ?Pain location: bil low back and right lateral thigh ?  ?Aggravating factors: lying down and then getting back up; difficulty sleeping; AMs; 2-3 min to get out of bed; bending over; sitting in a soft chair ; pain with coughing ?Relieving factors: firmer mattress  ?Walking generally OK;   ?PRECAUTIONS: None ? ?WEIGHT BEARING RESTRICTIONS No ? ?FALLS:  ?Has patient fallen in last 6 months? No ? ?LIVING ENVIRONMENT: ?Lives with: lives with their spouse ?Lives in: House/apartment ? ? ?OCCUPATION: retired but plays guitar events  ? ?PLOF: Independent ? ?PATIENT GOALS get back to golf; relieve pain ? ? ?OBJECTIVE:  ? ?DIAGNOSTIC FINDINGS:  ?X-ray degenerative disc disease ? ?PATIENT SURVEYS:  ?FOTO 67% ? ? ? ?COGNITION: ? Overall cognitive status: Within functional limits for tasks assessed   ?  ?SENSATION: ?WFL ? ?POSTURE:  ?WFLs ? ?PALPATION: ?Lateral trunk lipoma; tender over hip greater trochanter ? ?LUMBAR ROM:  ? ?Active  A/PROM  ?02/16/2022  ?Flexion 70  ?Extension 15  ?Right lateral flexion 20  ?Left lateral flexion 20  ?Right rotation   ?Left rotation   ? (Blank rows = not tested) ? ?LE ROM:   WFLS ?LE MMT:  SLS on right < 3 sec ? ?MMT Right ?02/16/2022 Left ?02/16/2022  ?Hip flexion 4+ 5  ?Hip extension 4+ 5  ?Hip abduction 4- 5  ?Hip adduction    ?Hip internal rotation    ?Hip external rotation    ?Knee flexion    ?Knee extension    ?Ankle dorsiflexion    ?Ankle plantarflexion    ?Ankle inversion    ?Ankle eversion    ? (Blank rows = not tested) ? ?GAIT: ?Comments: WNLs ? ?Guarded with rolling, moving on/off treatment table  ? ?TODAY'S TREATMENT  ?Manual therapy soft tissue  ?DRY NEEDLING: ?Dry needling consent given? yes ?Educational handouts provided? No previously given ?Muscles treated: right lumbar multifidi and glute ?Response from dry needling: improved soft tissue mobility ?ES added 1.5 ma 8 min  ? ? ?PATIENT EDUCATION:  ?Education details: treatment plan ?Person educated: Patient ?Education method: Explanation ?Education comprehension: verbalized understanding ? ? ?HOME EXERCISE PROGRAM: ?Will start next visit ? ?ASSESSMENT: ? ?CLINICAL IMPRESSION: ?Patient is a 71 y.o. male who was seen today for physical therapy evaluation and treatment for back pain. The patient is known to this therapist from treatment in the Fall for hip and back pain.  He responded very well to PT and was able to return to golf and playing his guitar at events.  In February he and his wife contracted Covid.  After that he began having bil back pain and right lateral thigh pain affecting his ability to sleep and lie down.  He reports his exercises were too painful to do.  He would benefit from PT for pain management, and strengthening of right hip and core musculature.   ? ? ?OBJECTIVE IMPAIRMENTS decreased ROM, decreased strength, increased fascial restrictions, and pain.  ? ?ACTIVITY LIMITATIONS community activity, meal prep, yard work, and shopping.  ? ?PERSONAL FACTORS Past/current experiences are also affecting patient's functional outcome.  ? ? ?REHAB POTENTIAL: Good ? ?CLINICAL DECISION  MAKING:  Stable/uncomplicated ? ?EVALUATION COMPLEXITY: Low ? ? ?GOALS: ?Goals reviewed with patient? Yes ? ?SHORT TERM GOALS: Target date: 03/16/2022 ? ?The patient will demonstrate knowledge of basic self care strategies and exercises to promote healing   ?Baseline: ?Goal status: INITIAL ? ?2.  The patient will report a 50% improvement in pain levels with functional activities which are currently difficult including lying down and with morning time ADLS ?Baseline:  ?Goal status: INITIAL ? ?3.  The patient will be report a 50% improvement in sleep ?Baseline:  ?Goal status: INITIAL ? ? ?LONG TERM GOALS: Target date: 04/13/2022 ? ?The patient will be independent in a safe self progression of a home exercise program to promote further recovery of function   ?Baseline:  ?Goal status: INITIAL ? ?2.  The patient will report a 75% improvement in pain levels with functional activities which are currently difficult including  ?Baseline:  ?Goal status: INITIAL ? ?3.  The patient will have improved trunk flexor and extensor muscle strength to at least 4+/5 needed for lifting medium weight objects such as grocery bags, laundry and luggage  ?Baseline:  ?Goal status: INITIAL ? ?4.  The patient will have improved right hip strength to at least 4+/5 needed for standing, walking longer distances and descending stairs at home and in the community and return to golf ?Baseline:  ?Goal status: INITIAL ? ?5.  FOTO score improved from 67% to 74% ?Baseline:  ?Goal status: INITIAL ? ? ?PLAN: ?PT FREQUENCY: 2x/week ? ?PT DURATION: 8 weeks ? ?PLANNED INTERVENTIONS: Therapeutic exercises, Therapeutic activity, Neuromuscular re-education, Balance training, Gait training, Patient/Family education, Joint mobilization, Aquatic Therapy, Dry Needling, Electrical stimulation, Spinal mobilization, Moist heat, Traction, Ultrasound, Ionotophoresis '4mg'$ /ml Dexamethasone, and Manual therapy. ? ?PLAN FOR NEXT SESSION: assess response to DN combined with ES to right  low back and glutes;  right hip and lumbar mobs and soft tissue work;  low level stretch/lumbar and hip mobility; right hip strengthening low level ? ?Ruben Im, PT ?02/16/22 5:40 PM ?Phone: [phone] ?Fax: [phone]

## 2022-02-18 ENCOUNTER — Ambulatory Visit: Payer: Medicare PPO | Admitting: Physical Therapy

## 2022-02-18 DIAGNOSIS — M25551 Pain in right hip: Secondary | ICD-10-CM

## 2022-02-18 DIAGNOSIS — M5136 Other intervertebral disc degeneration, lumbar region: Secondary | ICD-10-CM | POA: Diagnosis not present

## 2022-02-18 DIAGNOSIS — R252 Cramp and spasm: Secondary | ICD-10-CM

## 2022-02-18 DIAGNOSIS — G8929 Other chronic pain: Secondary | ICD-10-CM

## 2022-02-18 DIAGNOSIS — M6281 Muscle weakness (generalized): Secondary | ICD-10-CM

## 2022-02-18 DIAGNOSIS — M545 Low back pain, unspecified: Secondary | ICD-10-CM

## 2022-02-18 NOTE — Therapy (Signed)
?OUTPATIENT PHYSICAL THERAPY TREATMENT NOTE ? ? ?Patient Name: Jesse [name] ?MRN: [id number] ?DOB:1951-06-17, 71 y.o., male ?Today's Date: 02/18/2022 ? ?PCP: Dr. Orland Mustard ?REFERRING PROVIDER: Dr. Tonita Cong ? ?END OF SESSION:  ? PT End of Session - 02/18/22 0850   ? ? Visit Number 2   ? Date for PT Re-Evaluation 04/13/22   ? Authorization Type BCBS; [employer] 16 visits until 6/20  ? PT Start Time [phone]   ? PT Stop Time [phone]   ? PT Time Calculation (min) 39 min   ? Activity Tolerance Patient tolerated treatment well   ? ?  ?  ? ?  ? ? ?Past Medical History:  ?Diagnosis Date  ? Arthritis   ? knees  ? Asthma   ? seasonal  ? Eczema   ? GERD (gastroesophageal reflux disease)   ? HLD (hyperlipidemia)   ? HTN (hypertension)   ? Pulmonary emboli ([place])   ? post op knee scope for a few months  ? Sleep apnea 2004  ? TA (temporal arteritis) ([place])   ? Wears glasses   ? ?Past Surgical History:  ?Procedure Laterality Date  ? ABCESS DRAINAGE  02/22/2013  ? i/d-rectal  ? COLONOSCOPY  06/2015  ? No polyps, done for family history of colon polyps Next would be 2026  ? EYE SURGERY Left age 63  ? lazy eye  ? INGUINAL HERNIA REPAIR Right 05/25/2013  ? Procedure: RIGHT INGUINAL HERNIA REPAIR  WITH MESH;  Surgeon: Imogene Burn. Georgette Dover, MD;  Location: [facility];  Service: General;  Laterality: Right;  ? INSERTION OF MESH Right 05/25/2013  ? Procedure: INSERTION OF MESH;  Surgeon: Imogene Burn. Georgette Dover, MD;  Location: [facility];  Service: General;  Laterality: Right;  ? NASAL SEPTUM SURGERY  10/26/2003  ? TONSILLECTOMY  71 years old  ? TOTAL KNEE ARTHROPLASTY Right 10/25/2010  ? rt total knee  ? TOTAL KNEE ARTHROPLASTY Left 03/03/2020  ? Procedure: TOTAL KNEE ARTHROPLASTY;  Surgeon: Gaynelle Arabian, MD;  Location: [facility];  Service: Orthopedics;  Laterality: Left;  9mn  ? TRACHEAL SURGERY  10/26/1983  ? emergency trach from throat infection  ? UMBILICAL HERNIA REPAIR  10/26/1999  ? WISDOM TOOTH EXTRACTION    ? ?Patient Active  Problem List  ? Diagnosis Date Noted  ? OA (osteoarthritis) of knee 03/03/2020  ? Murmur, cardiac 05/08/2014  ? RBBB 05/08/2014  ? Right inguinal hernia 03/06/2013  ? Abscess of right buttock 11/23/2012  ? ? ?REFERRING DIAG: back pain ? ?THERAPY DIAG:  ?Back pain ? ?PERTINENT HISTORY: right side lipoma; bil TKR ? ?PRECAUTIONS: none ? ?SUBJECTIVE: It's like night and day.  I felt so much better on Wednesday and much better this morning.   I've got to do something about my quad and arm strengthening ? ?PAIN:  ?Are you having pain? 0 ?NPRS scale: 0/10 ?OBJECTIVE: (objective measures completed at initial evaluation unless otherwise dated) ?DIAGNOSTIC FINDINGS:  ?X-ray degenerative disc disease ?  ?PATIENT SURVEYS:  ?FOTO 67% ?  ?  ?  ?COGNITION: ?          Overall cognitive status: Within functional limits for tasks assessed               ?           ?SENSATION: ?WFL ?  ?POSTURE:  ?WFLs ?  ?PALPATION: ?Lateral trunk lipoma; tender over hip greater trochanter ?  ?LUMBAR ROM:  ?  ?Active  A/PROM  ?02/16/2022  ?Flexion 70  ?  Extension 15  ?Right lateral flexion 20  ?Left lateral flexion 20  ?Right rotation    ?Left rotation    ? (Blank rows = not tested) ?  ?LE ROM:  WFLS ?LE MMT:  SLS on right < 3 sec ?  ?MMT Right ?02/16/2022 Left ?02/16/2022  ?Hip flexion 4+ 5  ?Hip extension 4+ 5  ?Hip abduction 4- 5  ?Hip adduction      ?Hip internal rotation      ?Hip external rotation      ?Knee flexion      ?Knee extension      ?Ankle dorsiflexion      ?Ankle plantarflexion      ?Ankle inversion      ?Ankle eversion      ? (Blank rows = not tested) ?  ?GAIT: ?Comments: WNLs ?  ?Guarded with rolling, moving on/off treatment table  ?  ?TODAY'S TREATMENT  ?4/27: ?Nu-Step L1 8 min ?Lumbar rotation right/left 5-7x ?Stretch strap: right and left HS with side to side bias ; piriformis stretch with strap   ?Hand to knee push isometric 10x with abdominal brace ?Supine blue band clams 10x bil ?2nd step hip flexor stretch 7x each side ?Blue band  shoulder extension 15x ?Blue band staggered stance 15x each way ?Wall slides 10x  ? ? ? ? ? ? ?Eval day: Manual therapy soft tissue  ?DRY NEEDLING: ?Dry needling consent given? yes ?Educational handouts provided? No previously given ?Muscles treated: right lumbar multifidi and glute ?Response from dry needling: improved soft tissue mobility ?ES added 1.5 ma 8 min  ?  ?  ?PATIENT EDUCATION:  ?Education details: treatment plan ?Person educated: Patient ?Education method: Explanation ?Education comprehension: verbalized understanding ?  ?  ?HOME EXERCISE PROGRAM: ?Access Code: [email or web address] ?URL: [email or web address] ?Date: 02/18/2022 ?Prepared by: Ruben Im ? ?Exercises ?- Seated Sidebending Arms Overhead  - 1 x daily - 7 x weekly - 1 sets - 2 reps - 20 hold ?- Quadruped Rocking Slow  - 1 x daily - 7 x weekly - 1 sets - 15 reps ?- Supine Double Knee to Chest  - 1 x daily - 7 x weekly - 1 sets - 60 reps ?- Supine Piriformis Stretch with Foot on Ground  - 1 x daily - 7 x weekly - 1 sets - 2 reps - 20 hold ?- Sidelying Open Book Thoracic Rotation with Knee on Foam Roll  - 1 x daily - 7 x weekly - 1 sets - 10 reps ?- Seated Thoracic Lumbar Extension with Pectoralis Stretch  - 1 x daily - 7 x weekly - 1 sets - 10 reps ?- doorway hip flexor stretch   - 1 x daily - 7 x weekly - 3 sets - 5 reps ?- Clamshell with Resistance  - 1 x daily - 7 x weekly - 2 sets - 10 reps ?- Bird Dog  - 1 x daily - 7 x weekly - 1 sets - 5 reps - 5 hold ?- Standing Hip Hinge with Dowel  - 1 x daily - 7 x weekly - 1 sets - 10 reps ?- Marching with Same-Side Arm Raise and Walker  - 1 x daily - 7 x weekly - 1 sets - 10 reps ?- Plank on Knees  - 1 x daily - 7 x weekly - 1 sets - 5 reps - 5 hold ?- Half Deadlift with Kettlebell  - 1 x daily - 7 x weekly - 1 sets - 10 reps ?-  Forward Step Down Touch with Heel  - 1 x daily - 7 x weekly - 1 sets - 10 reps ?- Marching with Same-Side Arm Raise  - 1 x daily - 7 x weekly - 1 sets - 10 reps ?-  Standing Anti-Rotation Press with Anchored Resistance  - 1 x daily - 7 x weekly - 5 sets - 15 reps ?- Insurance claims handler (Mirrored)  - 1 x daily - 7 x weekly - 1 sets - 10 reps ?- SNATCH AND PRESS OVERHEAD  - 1 x daily - 7 x weekly - 1 sets - 10 reps ?- Side Plank on Knees  - 1 x daily - 7 x weekly - 1 sets - 10 reps ?- Supine Lower Trunk Rotation  - 1 x daily - 7 x weekly - 1 sets - 10 reps ?- Hooklying Hamstring Stretch with Strap  - 1 x daily - 7 x weekly - 1 sets - 5 reps ?- Hooklying Isometric Hip Flexion with Opposite Arm  - 1 x daily - 7 x weekly - 1 sets - 10 reps ?- Hip Flexor Stretch with Chair  - 1 x daily - 7 x weekly - 1 sets - 10 reps ?- Shoulder extension with resistance - Neutral  - 1 x daily - 7 x weekly - 1 sets - 10 reps ?- Single-Leg Anti-Rotation Press With Anchored Resistance  - 1 x daily - 7 x weekly - 1 sets - 10 reps ?Review of previous stretches ?  ?ASSESSMENT: ?  ?CLINICAL IMPRESSION:Excellent response to DN on initial visit, painfree today. Able to re-initiate stretches and low level core strengthening without pain production. Therapist monitoring response to all interventions and modifying treatment accordingly.  ?  ?  ?OBJECTIVE IMPAIRMENTS decreased ROM, decreased strength, increased fascial restrictions, and pain.  ?  ?ACTIVITY LIMITATIONS community activity, meal prep, yard work, and shopping.  ?  ?PERSONAL FACTORS Past/current experiences are also affecting patient's functional outcome.  ?  ?  ?REHAB POTENTIAL: Good ?  ?CLINICAL DECISION MAKING: Stable/uncomplicated ?  ?EVALUATION COMPLEXITY: Low ?  ?  ?GOALS: ?Goals reviewed with patient? Yes ?  ?SHORT TERM GOALS: Target date: 03/16/2022 ?  ?The patient will demonstrate knowledge of basic self care strategies and exercises to promote healing   ?Baseline: ?Goal status: INITIAL ?  ?2.  The patient will report a 50% improvement in pain levels with functional activities which are currently difficult including lying down and with  morning time ADLS ?Baseline:  ?Goal status: INITIAL ?  ?3.  The patient will be report a 50% improvement in sleep ?Baseline:  ?Goal status: INITIAL ?  ?  ?LONG TERM GOALS: Target date: 04/13/2022 ?  ?The pat

## 2022-02-19 ENCOUNTER — Ambulatory Visit (INDEPENDENT_AMBULATORY_CARE_PROVIDER_SITE_OTHER): Payer: Self-pay

## 2022-02-19 DIAGNOSIS — B351 Tinea unguium: Secondary | ICD-10-CM

## 2022-02-19 NOTE — Progress Notes (Signed)
Patient presents today for the 4th laser treatment. Diagnosed with mycotic nail infection by Dr. Cannon Kettle.  ? ?Toenail most affected bilateral hallux. The hallux nails are looking better ? ?All other systems are negative. ? ?Nails were filed thin. Laser therapy was administered to bilateral 1-5 toenails  and patient tolerated the treatment well. All safety precautions were in place.  ? ? ?Follow up in 6 weeks for laser # 5. ? ? ?

## 2022-02-23 ENCOUNTER — Ambulatory Visit: Payer: Medicare PPO | Attending: Specialist | Admitting: Physical Therapy

## 2022-02-23 ENCOUNTER — Encounter: Payer: Self-pay | Admitting: Physical Therapy

## 2022-02-23 DIAGNOSIS — G8929 Other chronic pain: Secondary | ICD-10-CM | POA: Insufficient documentation

## 2022-02-23 DIAGNOSIS — M545 Low back pain, unspecified: Secondary | ICD-10-CM | POA: Insufficient documentation

## 2022-02-23 DIAGNOSIS — M6281 Muscle weakness (generalized): Secondary | ICD-10-CM | POA: Diagnosis not present

## 2022-02-23 DIAGNOSIS — M25551 Pain in right hip: Secondary | ICD-10-CM | POA: Insufficient documentation

## 2022-02-23 DIAGNOSIS — R252 Cramp and spasm: Secondary | ICD-10-CM | POA: Diagnosis not present

## 2022-02-23 NOTE — Therapy (Signed)
?OUTPATIENT PHYSICAL THERAPY TREATMENT NOTE ? ? ?Patient Name: Jesse [name] ?MRN: [id number] ?DOB:Jan 31, 1951, 71 y.o., male ?Today's Date: 02/23/2022 ? ?PCP: Dr. Orland Mustard ?REFERRING PROVIDER: Dr. Tonita Cong ? ?END OF SESSION:  ? PT End of Session - 02/18/22 0850   ? ? Visit Number 3   ? Date for PT Re-Evaluation 04/13/22   ? Authorization Type BCBS; [employer] 16 visits until 6/20  ? PT Start Time [phone]  ? PT Stop Time 0855  ? PT Time Calculation (min) 52 min   ? Activity Tolerance Patient tolerated treatment well   ? ?  ?  ? ?  ? ? ?Past Medical History:  ?Diagnosis Date  ? Arthritis   ? knees  ? Asthma   ? seasonal  ? Eczema   ? GERD (gastroesophageal reflux disease)   ? HLD (hyperlipidemia)   ? HTN (hypertension)   ? Pulmonary emboli ([place])   ? post op knee scope for a few months  ? Sleep apnea 2004  ? TA (temporal arteritis) ([place])   ? Wears glasses   ? ?Past Surgical History:  ?Procedure Laterality Date  ? ABCESS DRAINAGE  02/22/2013  ? i/d-rectal  ? COLONOSCOPY  06/2015  ? No polyps, done for family history of colon polyps Next would be 2026  ? EYE SURGERY Left age 49  ? lazy eye  ? INGUINAL HERNIA REPAIR Right 05/25/2013  ? Procedure: RIGHT INGUINAL HERNIA REPAIR  WITH MESH;  Surgeon: Imogene Burn. Georgette Dover, MD;  Location: [place];  Service: General;  Laterality: Right;  ? INSERTION OF MESH Right 05/25/2013  ? Procedure: INSERTION OF MESH;  Surgeon: Imogene Burn. Georgette Dover, MD;  Location: [place];  Service: General;  Laterality: Right;  ? NASAL SEPTUM SURGERY  10/26/2003  ? TONSILLECTOMY  71 years old  ? TOTAL KNEE ARTHROPLASTY Right 10/25/2010  ? rt total knee  ? TOTAL KNEE ARTHROPLASTY Left 03/03/2020  ? Procedure: TOTAL KNEE ARTHROPLASTY;  Surgeon: Gaynelle Arabian, MD;  Location: [facility];  Service: Orthopedics;  Laterality: Left;  84mn  ? TRACHEAL SURGERY  10/26/1983  ? emergency trach from throat infection  ? UMBILICAL HERNIA REPAIR  10/26/1999  ? WISDOM TOOTH EXTRACTION    ? ?Patient Active  Problem List  ? Diagnosis Date Noted  ? OA (osteoarthritis) of knee 03/03/2020  ? Murmur, cardiac 05/08/2014  ? RBBB 05/08/2014  ? Right inguinal hernia 03/06/2013  ? Abscess of right buttock 11/23/2012  ? ? ?REFERRING DIAG: back pain ? ?THERAPY DIAG:  ?Back pain ? ?PERTINENT HISTORY: right side lipoma; bil TKR ? ?PRECAUTIONS: none ? ?SUBJECTIVE:  I am 60% improved with my mobility and pain already with PT.   ? ?PAIN:  ?Are you having pain? 0 ?NPRS scale: 3-4/10 ? ?OBJECTIVE: (objective measures completed at initial evaluation unless otherwise dated) ?DIAGNOSTIC FINDINGS:  ?X-ray degenerative disc disease ?  ?PATIENT SURVEYS:  ?FOTO 67% ?  ?COGNITION: ?          Overall cognitive status: Within functional limits for tasks assessed               ?           ?SENSATION: ?WFL ?  ?POSTURE:  ?WFLs ?  ?PALPATION: ?Lateral trunk lipoma; tender over hip greater trochanter ?  ?LUMBAR ROM:  ?  ?Active  A/PROM  ?02/16/2022  ?Flexion 70  ?Extension 15  ?Right lateral flexion 20  ?Left lateral flexion 20  ?Right rotation    ?Left rotation    ? (  Blank rows = not tested) ?  ?LE ROM:  WFLS ?LE MMT:  SLS on right < 3 sec ?  ?MMT Right ?02/16/2022 Left ?02/16/2022  ?Hip flexion 4+ 5  ?Hip extension 4+ 5  ?Hip abduction 4- 5  ?Hip adduction      ?Hip internal rotation      ?Hip external rotation      ?Knee flexion      ?Knee extension      ?Ankle dorsiflexion      ?Ankle plantarflexion      ?Ankle inversion      ?Ankle eversion      ? (Blank rows = not tested) ?  ?GAIT: ?Comments: WNLs ?  ?Guarded with rolling, moving on/off treatment table  ?  ?TODAY'S TREATMENT  ?02/23/22: ?NuStep L2 x 6' seat 11 arms 10 PT present  ?Supine hamstring stretch with strap, add side to side bias for adductors and ITB, 1x30" each, bil ?Supine Rt piriformis stretch 1x30" ?Supine lower trunk rotation x 10 reps ?Supine hip isometrics alt Lt/Rt with abdominal bracing, PT cued exhale vs "brace" for dynamic indraw, 10 reps ?Quadruped rocking, slow, PT cued open  back of hip spreading SITS bones when sitting back ?Hip flexor stretch standing foot on 2nd step, 2x10" each Rt/Lt ?STM to Rt lateral hip after DN (see below) ? ? ?Trigger Point Dry-Needling  ?Treatment instructions: Expect mild to moderate muscle soreness. S/S of pneumothorax if dry needled over a lung field, and to seek immediate medical attention should they occur. Patient verbalized understanding of these instructions and education. ? ?Patient Consent Given: Yes ?Education handout provided: Previously provided ?Muscles treated: lumbar multifidi Rt, Rt glut min and med ?Electrical stimulation performed: No ?Parameters: N/A ?Treatment response/outcome: deep cramp and release ? ?Moist heat end of session Rt lateral hip and lumbar x 10' ? ? ? ?4/27: ?Nu-Step L1 8 min ?Lumbar rotation right/left 5-7x ?Stretch strap: right and left HS with side to side bias ; piriformis stretch with strap   ?Hand to knee push isometric 10x with abdominal brace ?Supine blue band clams 10x bil ?2nd step hip flexor stretch 7x each side ?Blue band shoulder extension 15x ?Blue band staggered stance 15x each way ?Wall slides 10x  ? ? ?Eval day: Manual therapy soft tissue  ?DRY NEEDLING: ?Dry needling consent given? yes ?Educational handouts provided? No previously given ?Muscles treated: right lumbar multifidi and glute ?Response from dry needling: improved soft tissue mobility ?ES added 1.5 ma 8 min  ?  ?  ?PATIENT EDUCATION:  ?Education details: treatment plan ?Person educated: Patient ?Education method: Explanation ?Education comprehension: verbalized understanding ?  ?  ?HOME EXERCISE PROGRAM: ?Access Code: [email or web address] ?URL: [email or web address] ?Date: 02/18/2022 ?Prepared by: Ruben Im ? ?Exercises ?- Seated Sidebending Arms Overhead  - 1 x daily - 7 x weekly - 1 sets - 2 reps - 20 hold ?- Quadruped Rocking Slow  - 1 x daily - 7 x weekly - 1 sets - 15 reps ?- Supine Double Knee to Chest  - 1 x daily - 7 x weekly - 1  sets - 60 reps ?- Supine Piriformis Stretch with Foot on Ground  - 1 x daily - 7 x weekly - 1 sets - 2 reps - 20 hold ?- Sidelying Open Book Thoracic Rotation with Knee on Foam Roll  - 1 x daily - 7 x weekly - 1 sets - 10 reps ?- Seated Thoracic Lumbar Extension with Pectoralis Stretch  - 1 x daily - 7  x weekly - 1 sets - 10 reps ?- doorway hip flexor stretch   - 1 x daily - 7 x weekly - 3 sets - 5 reps ?- Clamshell with Resistance  - 1 x daily - 7 x weekly - 2 sets - 10 reps ?- Bird Dog  - 1 x daily - 7 x weekly - 1 sets - 5 reps - 5 hold ?- Standing Hip Hinge with Dowel  - 1 x daily - 7 x weekly - 1 sets - 10 reps ?- Marching with Same-Side Arm Raise and Walker  - 1 x daily - 7 x weekly - 1 sets - 10 reps ?- Plank on Knees  - 1 x daily - 7 x weekly - 1 sets - 5 reps - 5 hold ?- Half Deadlift with Kettlebell  - 1 x daily - 7 x weekly - 1 sets - 10 reps ?- Forward Step Down Touch with Heel  - 1 x daily - 7 x weekly - 1 sets - 10 reps ?- Marching with Same-Side Arm Raise  - 1 x daily - 7 x weekly - 1 sets - 10 reps ?- Standing Anti-Rotation Press with Anchored Resistance  - 1 x daily - 7 x weekly - 5 sets - 15 reps ?- Insurance claims handler (Mirrored)  - 1 x daily - 7 x weekly - 1 sets - 10 reps ?- SNATCH AND PRESS OVERHEAD  - 1 x daily - 7 x weekly - 1 sets - 10 reps ?- Side Plank on Knees  - 1 x daily - 7 x weekly - 1 sets - 10 reps ?- Supine Lower Trunk Rotation  - 1 x daily - 7 x weekly - 1 sets - 10 reps ?- Hooklying Hamstring Stretch with Strap  - 1 x daily - 7 x weekly - 1 sets - 5 reps ?- Hooklying Isometric Hip Flexion with Opposite Arm  - 1 x daily - 7 x weekly - 1 sets - 10 reps ?- Hip Flexor Stretch with Chair  - 1 x daily - 7 x weekly - 1 sets - 10 reps ?- Shoulder extension with resistance - Neutral  - 1 x daily - 7 x weekly - 1 sets - 10 reps ?- Single-Leg Anti-Rotation Press With Anchored Resistance  - 1 x daily - 7 x weekly - 1 sets - 10 reps ?Review of previous stretches ?  ?ASSESSMENT: ?   ?CLINICAL IMPRESSION: ?Pt reports 60% improved mobility and pain with PT to date.  He is demonstrating greater range with LE stretches today.  PT cued dynamic indraw for core with supine hip flexor isometric due to Pt

## 2022-02-25 ENCOUNTER — Ambulatory Visit: Payer: Medicare PPO | Admitting: Physical Therapy

## 2022-02-25 DIAGNOSIS — R252 Cramp and spasm: Secondary | ICD-10-CM | POA: Diagnosis not present

## 2022-02-25 DIAGNOSIS — M6281 Muscle weakness (generalized): Secondary | ICD-10-CM | POA: Diagnosis not present

## 2022-02-25 DIAGNOSIS — M545 Low back pain, unspecified: Secondary | ICD-10-CM | POA: Diagnosis not present

## 2022-02-25 DIAGNOSIS — M25551 Pain in right hip: Secondary | ICD-10-CM

## 2022-02-25 DIAGNOSIS — G8929 Other chronic pain: Secondary | ICD-10-CM | POA: Diagnosis not present

## 2022-02-25 NOTE — Therapy (Signed)
?OUTPATIENT PHYSICAL THERAPY TREATMENT NOTE ? ? ?Patient Name: Jesse [name] ?MRN: [id number] ?DOB:12-Jul-1951, 71 y.o., male ?Today's Date: 02/25/2022 ? ?PCP: Dr. Orland Mustard ?REFERRING PROVIDER: Dr. Tonita Cong ? ?END OF SESSION:  ? PT End of Session - 02/18/22 0850   ? ? Visit Number 3   ? Date for PT Re-Evaluation 04/13/22   ? Authorization Type BCBS; [employer] 16 visits until 6/20  ? PT Start Time [phone]  ? PT Stop Time 0855  ? PT Time Calculation (min) 52 min   ? Activity Tolerance Patient tolerated treatment well   ? ?  ?  ? ?  ? ? ?Past Medical History:  ?Diagnosis Date  ? Arthritis   ? knees  ? Asthma   ? seasonal  ? Eczema   ? GERD (gastroesophageal reflux disease)   ? HLD (hyperlipidemia)   ? HTN (hypertension)   ? Pulmonary emboli ([place])   ? post op knee scope for a few months  ? Sleep apnea 2004  ? TA (temporal arteritis) ([place])   ? Wears glasses   ? ?Past Surgical History:  ?Procedure Laterality Date  ? ABCESS DRAINAGE  02/22/2013  ? i/d-rectal  ? COLONOSCOPY  06/2015  ? No polyps, done for family history of colon polyps Next would be 2026  ? EYE SURGERY Left age 21  ? lazy eye  ? INGUINAL HERNIA REPAIR Right 05/25/2013  ? Procedure: RIGHT INGUINAL HERNIA REPAIR  WITH MESH;  Surgeon: Imogene Burn. Georgette Dover, MD;  Location: [facility];  Service: General;  Laterality: Right;  ? INSERTION OF MESH Right 05/25/2013  ? Procedure: INSERTION OF MESH;  Surgeon: Imogene Burn. Georgette Dover, MD;  Location: [place];  Service: General;  Laterality: Right;  ? NASAL SEPTUM SURGERY  10/26/2003  ? TONSILLECTOMY  71 years old  ? TOTAL KNEE ARTHROPLASTY Right 10/25/2010  ? rt total knee  ? TOTAL KNEE ARTHROPLASTY Left 03/03/2020  ? Procedure: TOTAL KNEE ARTHROPLASTY;  Surgeon: Gaynelle Arabian, MD;  Location: [facility];  Service: Orthopedics;  Laterality: Left;  77mn  ? TRACHEAL SURGERY  10/26/1983  ? emergency trach from throat infection  ? UMBILICAL HERNIA REPAIR  10/26/1999  ? WISDOM TOOTH EXTRACTION    ? ?Patient Active  Problem List  ? Diagnosis Date Noted  ? OA (osteoarthritis) of knee 03/03/2020  ? Murmur, cardiac 05/08/2014  ? RBBB 05/08/2014  ? Right inguinal hernia 03/06/2013  ? Abscess of right buttock 11/23/2012  ? ? ?REFERRING DIAG: back pain ? ?THERAPY DIAG:  ?Back pain ? ?PERTINENT HISTORY: right side lipoma; bil TKR ? ?PRECAUTIONS: none ? ?SUBJECTIVE:  Soreness more than pain.  Feeling better.  I don't think I rotate enough with playing golf.   ? PAIN:  ?Are you having pain? 0 ?NPRS scale: 2-3/10 ? ?OBJECTIVE: (objective measures completed at initial evaluation unless otherwise dated) ?DIAGNOSTIC FINDINGS:  ?X-ray degenerative disc disease ?  ?PATIENT SURVEYS:  ?FOTO 67% ?  ?COGNITION: ?          Overall cognitive status: Within functional limits for tasks assessed               ?           ?SENSATION: ?WFL ?  ?POSTURE:  ?WFLs ?  ?PALPATION: ?Lateral trunk lipoma; tender over hip greater trochanter ?  ?LUMBAR ROM:  ?  ?Active  A/PROM  ?02/16/2022  ?Flexion 70  ?Extension 15  ?Right lateral flexion 20  ?Left lateral flexion 20  ?Right rotation    ?  Left rotation    ? (Blank rows = not tested) ?  ?LE ROM:  WFLS ?LE MMT:  SLS on right < 3 sec ?  ?MMT Right ?02/16/2022 Left ?02/16/2022  ?Hip flexion 4+ 5  ?Hip extension 4+ 5  ?Hip abduction 4- 5  ?Hip adduction      ?Hip internal rotation      ?Hip external rotation      ?Knee flexion      ?Knee extension      ?Ankle dorsiflexion      ?Ankle plantarflexion      ?Ankle inversion      ?Ankle eversion      ? (Blank rows = not tested) ?  ?GAIT: ?Comments: WNLs ?  ?Guarded with rolling, moving on/off treatment table  ?  ?TODAY'S TREATMENT  ?02/25/22: ?NuStep L6x 6' seat 11 arms 10 PT present  ?Supine hamstring stretch with strap, add side to side bias for adductors and ITB, 1x30" each, bil ?Supine Rt piriformis stretch 1x30" ?Supine lower trunk rotation x 10 reps ?Quadruped thoracic rotation 10x each way ?Sidelying open books 10x right/left  ?Quadruped rocking, slow, PT cued open back  of hip spreading SITS bones when sitting back ?Staggered feet standing PNF chops 7x each way ? ? ?02/23/22: ?NuStep L2 x 6' seat 11 arms 10 PT present  ?Supine hamstring stretch with strap, add side to side bias for adductors and ITB, 1x30" each, bil ?Supine Rt and left piriformis stretch 1x30" ?Supine lower trunk rotation x 10 reps ?Supine hip isometrics alt Lt/Rt with abdominal bracing, PT cued exhale vs "brace" for dynamic indraw, 10 reps ?Quadruped rocking, slow, PT cued open back of hip spreading SITS bones when sitting back ?Hip flexor stretch standing foot on 2nd step, 2x10" each Rt/Lt ?STM to Rt lateral hip after DN (see below) ? ? ?Trigger Point Dry-Needling  ?Treatment instructions: Expect mild to moderate muscle soreness. S/S of pneumothorax if dry needled over a lung field, and to seek immediate medical attention should they occur. Patient verbalized understanding of these instructions and education. ? ?Patient Consent Given: Yes ?Education handout provided: Previously provided ?Muscles treated: lumbar multifidi Rt, Rt glut min and med ?Electrical stimulation performed: No ?Parameters: N/A ?Treatment response/outcome: deep cramp and release ? ?Moist heat end of session Rt lateral hip and lumbar x 10' ? ? ? ?4/27: ?Nu-Step L1 8 min ?Lumbar rotation right/left 5-7x ?Stretch strap: right and left HS with side to side bias ; piriformis stretch with strap   ?Hand to knee push isometric 10x with abdominal brace ?Supine blue band clams 10x bil ?2nd step hip flexor stretch 7x each side ?Blue band shoulder extension 15x ?Blue band staggered stance 15x each way ?Wall slides 10x  ? ? ?Eval day: Manual therapy soft tissue  ?DRY NEEDLING: ?Dry needling consent given? yes ?Educational handouts provided? No previously given ?Muscles treated: right lumbar multifidi and glute ?Response from dry needling: improved soft tissue mobility ?ES added 1.5 ma 8 min  ?  ?  ?PATIENT EDUCATION:  ?Education details: treatment  plan ?Person educated: Patient ?Education method: Explanation ?Education comprehension: verbalized understanding ?  ?  ?HOME EXERCISE PROGRAM: ?Access Code: [email or web address] ?URL: [email or web address] ?Date: 02/25/2022 ?Prepared by: Ruben Im ? ?Exercises ?- Seated Sidebending Arms Overhead  - 1 x daily - 7 x weekly - 1 sets - 2 reps - 20 hold ?- Quadruped Rocking Slow  - 1 x daily - 7 x weekly - 1 sets - 15 reps ?- Supine Double  Knee to Chest  - 1 x daily - 7 x weekly - 1 sets - 60 reps ?- Supine Piriformis Stretch with Foot on Ground  - 1 x daily - 7 x weekly - 1 sets - 2 reps - 20 hold ?- Seated Thoracic Lumbar Extension with Pectoralis Stretch  - 1 x daily - 7 x weekly - 1 sets - 10 reps ?- doorway hip flexor stretch   - 1 x daily - 7 x weekly - 3 sets - 5 reps ?- Clamshell with Resistance  - 1 x daily - 7 x weekly - 2 sets - 10 reps ?- Bird Dog  - 1 x daily - 7 x weekly - 1 sets - 5 reps - 5 hold ?- Standing Hip Hinge with Dowel  - 1 x daily - 7 x weekly - 1 sets - 10 reps ?- Marching with Same-Side Arm Raise and Walker  - 1 x daily - 7 x weekly - 1 sets - 10 reps ?- Plank on Knees  - 1 x daily - 7 x weekly - 1 sets - 5 reps - 5 hold ?- Half Deadlift with Kettlebell  - 1 x daily - 7 x weekly - 1 sets - 10 reps ?- Forward Step Down Touch with Heel  - 1 x daily - 7 x weekly - 1 sets - 10 reps ?- Marching with Same-Side Arm Raise  - 1 x daily - 7 x weekly - 1 sets - 10 reps ?- Standing Anti-Rotation Press with Anchored Resistance  - 1 x daily - 7 x weekly - 5 sets - 15 reps ?- Insurance claims handler (Mirrored)  - 1 x daily - 7 x weekly - 1 sets - 10 reps ?- SNATCH AND PRESS OVERHEAD  - 1 x daily - 7 x weekly - 1 sets - 10 reps ?- Side Plank on Knees  - 1 x daily - 7 x weekly - 1 sets - 10 reps ?- Supine Lower Trunk Rotation  - 1 x daily - 7 x weekly - 1 sets - 10 reps ?- Hooklying Hamstring Stretch with Strap  - 1 x daily - 7 x weekly - 1 sets - 5 reps ?- Hooklying Isometric Hip Flexion with Opposite  Arm  - 1 x daily - 7 x weekly - 1 sets - 10 reps ?- Hip Flexor Stretch with Chair  - 1 x daily - 7 x weekly - 1 sets - 10 reps ?- Shoulder extension with resistance - Neutral  - 1 x daily - 7 x weekly - 1 sets - 10

## 2022-03-02 ENCOUNTER — Ambulatory Visit: Payer: Medicare PPO | Admitting: Physical Therapy

## 2022-03-02 ENCOUNTER — Encounter: Payer: Self-pay | Admitting: Physical Therapy

## 2022-03-02 DIAGNOSIS — G8929 Other chronic pain: Secondary | ICD-10-CM | POA: Diagnosis not present

## 2022-03-02 DIAGNOSIS — M545 Low back pain, unspecified: Secondary | ICD-10-CM

## 2022-03-02 DIAGNOSIS — R252 Cramp and spasm: Secondary | ICD-10-CM

## 2022-03-02 DIAGNOSIS — M25551 Pain in right hip: Secondary | ICD-10-CM | POA: Diagnosis not present

## 2022-03-02 DIAGNOSIS — M6281 Muscle weakness (generalized): Secondary | ICD-10-CM

## 2022-03-02 NOTE — Therapy (Signed)
?OUTPATIENT PHYSICAL THERAPY TREATMENT NOTE ? ? ?Patient Name: Jesse [name] ?MRN: [id number] ?DOB:04/07/1951, 71 y.o., male ?Today's Date: 03/02/2022 ? ?PCP: Dr. Orland Mustard ?REFERRING PROVIDER: Dr. Tonita Cong ? ?END OF SESSION:  ? PT End of Session - 02/18/22 0850   ? ? Visit Number 5  ? Date for PT Re-Evaluation 04/13/22   ? Authorization Type BCBS; [employer] 16 visits until 6/20  ? PT Start Time 1101  ? PT Stop Time 1141  ? PT Time Calculation (min) 40 min   ? Activity Tolerance Patient tolerated treatment well   ? ?  ?  ? ?  ? ? ?Past Medical History:  ?Diagnosis Date  ? Arthritis   ? knees  ? Asthma   ? seasonal  ? Eczema   ? GERD (gastroesophageal reflux disease)   ? HLD (hyperlipidemia)   ? HTN (hypertension)   ? Pulmonary emboli ([place])   ? post op knee scope for a few months  ? Sleep apnea 2004  ? TA (temporal arteritis) ([place])   ? Wears glasses   ? ?Past Surgical History:  ?Procedure Laterality Date  ? ABCESS DRAINAGE  02/22/2013  ? i/d-rectal  ? COLONOSCOPY  06/2015  ? No polyps, done for family history of colon polyps Next would be 2026  ? EYE SURGERY Left age 44  ? lazy eye  ? INGUINAL HERNIA REPAIR Right 05/25/2013  ? Procedure: RIGHT INGUINAL HERNIA REPAIR  WITH MESH;  Surgeon: Imogene Burn. Georgette Dover, MD;  Location: [facility];  Service: General;  Laterality: Right;  ? INSERTION OF MESH Right 05/25/2013  ? Procedure: INSERTION OF MESH;  Surgeon: Imogene Burn. Georgette Dover, MD;  Location: [place] [facility];  Service: General;  Laterality: Right;  ? NASAL SEPTUM SURGERY  10/26/2003  ? TONSILLECTOMY  71 years old  ? TOTAL KNEE ARTHROPLASTY Right 10/25/2010  ? rt total knee  ? TOTAL KNEE ARTHROPLASTY Left 03/03/2020  ? Procedure: TOTAL KNEE ARTHROPLASTY;  Surgeon: Gaynelle Arabian, MD;  Location: [facility];  Service: Orthopedics;  Laterality: Left;  25mn  ? TRACHEAL SURGERY  10/26/1983  ? emergency trach from throat infection  ? UMBILICAL HERNIA REPAIR  10/26/1999  ? WISDOM TOOTH EXTRACTION    ? ?Patient Active Problem  List  ? Diagnosis Date Noted  ? OA (osteoarthritis) of knee 03/03/2020  ? Murmur, cardiac 05/08/2014  ? RBBB 05/08/2014  ? Right inguinal hernia 03/06/2013  ? Abscess of right buttock 11/23/2012  ? ? ?REFERRING DIAG: back pain ? ?THERAPY DIAG:  ?Back pain ? ?PERTINENT HISTORY: right side lipoma; bil TKR ? ?PRECAUTIONS: none ? ?SUBJECTIVE:  I slept without thick supportive pillows last night so I'm a little stiff.  I really like the new exercises from last time.  I want to keep working on strength and flexibility. ?  ? PAIN:  ?Are you having pain? 0 ?NPRS scale: 0/10 (just stiff) ? ?OBJECTIVE: (objective measures completed at initial evaluation unless otherwise dated) ?DIAGNOSTIC FINDINGS:  ?X-ray degenerative disc disease ?  ?PATIENT SURVEYS:  ?FOTO 67% ?  ?COGNITION: ?          Overall cognitive status: Within functional limits for tasks assessed               ?           ?SENSATION: ?WFL ?  ?POSTURE:  ?WFLs ?  ?PALPATION: ?Lateral trunk lipoma; tender over hip greater trochanter ?  ?LUMBAR ROM:  ?  ?Active  A/PROM  ?02/16/2022  ?Flexion  70  ?Extension 15  ?Right lateral flexion 20  ?Left lateral flexion 20  ?Right rotation    ?Left rotation    ? (Blank rows = not tested) ?  ?LE ROM:  WFLS ?LE MMT:  SLS on right < 3 sec ?  ?MMT Right ?02/16/2022 Left ?02/16/2022  ?Hip flexion 4+ 5  ?Hip extension 4+ 5  ?Hip abduction 4- 5  ?Hip adduction      ?Hip internal rotation      ?Hip external rotation      ?Knee flexion      ?Knee extension      ?Ankle dorsiflexion      ?Ankle plantarflexion      ?Ankle inversion      ?Ankle eversion      ? (Blank rows = not tested) ?  ?GAIT: ?Comments: WNLs ?  ?Guarded with rolling, moving on/off treatment table  ?  ?TODAY'S TREATMENT  ?03/02/22: ?NuStep L6x6' seat 12 arms 10 ?Supine piriformis stretch bil 1x30" ?Supine hamstring stretch with strap, add side to side bias for adductors and ITB,   ?      1x30" each, bil ?Supine lower trunk rotation x 10 reps, hold active stretch x 10" last rep  each way ?Hip windshield wipers x 10 ?Quadruped thoracic rotation 10x each way ?Sidelying open books 10x right/left  ?Sit to stand in stagger stance with 5lb kbell chest press to trunk rotation over front foot ?Stagger stance 5lb kbell PNF diag lift with trunk rotation x 10 each ?Cable pulley PNF chop 10lb x 12 reps each side ?Hip flexor stretch on stairs, dynamic rocking x 10 each side ? ?02/25/22: ?NuStep L6x 6' seat 11 arms 10 PT present  ?Supine hamstring stretch with strap, add side to side bias for adductors and ITB, 1x30" each, bil ?Supine Rt piriformis stretch 1x30" ?Supine lower trunk rotation x 10 reps ?Quadruped thoracic rotation 10x each way ?Sidelying open books 10x right/left  ?Quadruped rocking, slow, PT cued open back of hip spreading SITS bones when sitting back ?Staggered feet standing PNF chops 7x each way ? ? ?02/23/22: ?NuStep L2 x 6' seat 11 arms 10 PT present  ?Supine hamstring stretch with strap, add side to side bias for adductors and ITB, 1x30" each, bil ?Supine Rt and left piriformis stretch 1x30" ?Supine lower trunk rotation x 10 reps ?Supine hip isometrics alt Lt/Rt with abdominal bracing, PT cued exhale vs "brace" for dynamic indraw, 10 reps ?Quadruped rocking, slow, PT cued open back of hip spreading SITS bones when sitting back ?Hip flexor stretch standing foot on 2nd step, 2x10" each Rt/Lt ?STM to Rt lateral hip after DN (see below) ? ? ?Trigger Point Dry-Needling  ?Treatment instructions: Expect mild to moderate muscle soreness. S/S of pneumothorax if dry needled over a lung field, and to seek immediate medical attention should they occur. Patient verbalized understanding of these instructions and education. ? ?Patient Consent Given: Yes ?Education handout provided: Previously provided ?Muscles treated: lumbar multifidi Rt, Rt glut min and med ?Electrical stimulation performed: No ?Parameters: N/A ?Treatment response/outcome: deep cramp and release ? ?Moist heat end of session Rt  lateral hip and lumbar x 10' ?  ?  ?PATIENT EDUCATION:  ?Education details: treatment plan ?Person educated: Patient ?Education method: Explanation ?Education comprehension: verbalized understanding ?  ?  ?HOME EXERCISE PROGRAM: ?Access Code: [email or web address] ?URL: [email or web address] ?Date: 02/25/2022 ?Prepared by: Ruben Im ? ?Exercises ?- Seated Sidebending Arms Overhead  - 1 x daily - 7 x weekly - 1  sets - 2 reps - 20 hold ?- Quadruped Rocking Slow  - 1 x daily - 7 x weekly - 1 sets - 15 reps ?- Supine Double Knee to Chest  - 1 x daily - 7 x weekly - 1 sets - 60 reps ?- Supine Piriformis Stretch with Foot on Ground  - 1 x daily - 7 x weekly - 1 sets - 2 reps - 20 hold ?- Seated Thoracic Lumbar Extension with Pectoralis Stretch  - 1 x daily - 7 x weekly - 1 sets - 10 reps ?- doorway hip flexor stretch   - 1 x daily - 7 x weekly - 3 sets - 5 reps ?- Clamshell with Resistance  - 1 x daily - 7 x weekly - 2 sets - 10 reps ?- Bird Dog  - 1 x daily - 7 x weekly - 1 sets - 5 reps - 5 hold ?- Standing Hip Hinge with Dowel  - 1 x daily - 7 x weekly - 1 sets - 10 reps ?- Marching with Same-Side Arm Raise and Walker  - 1 x daily - 7 x weekly - 1 sets - 10 reps ?- Plank on Knees  - 1 x daily - 7 x weekly - 1 sets - 5 reps - 5 hold ?- Half Deadlift with Kettlebell  - 1 x daily - 7 x weekly - 1 sets - 10 reps ?- Forward Step Down Touch with Heel  - 1 x daily - 7 x weekly - 1 sets - 10 reps ?- Marching with Same-Side Arm Raise  - 1 x daily - 7 x weekly - 1 sets - 10 reps ?- Standing Anti-Rotation Press with Anchored Resistance  - 1 x daily - 7 x weekly - 5 sets - 15 reps ?- Insurance claims handler (Mirrored)  - 1 x daily - 7 x weekly - 1 sets - 10 reps ?- SNATCH AND PRESS OVERHEAD  - 1 x daily - 7 x weekly - 1 sets - 10 reps ?- Side Plank on Knees  - 1 x daily - 7 x weekly - 1 sets - 10 reps ?- Supine Lower Trunk Rotation  - 1 x daily - 7 x weekly - 1 sets - 10 reps ?- Hooklying Hamstring Stretch with Strap  - 1 x  daily - 7 x weekly - 1 sets - 5 reps ?- Hooklying Isometric Hip Flexion with Opposite Arm  - 1 x daily - 7 x weekly - 1 sets - 10 reps ?- Hip Flexor Stretch with Chair  - 1 x daily - 7 x weekly - 1 sets - 1

## 2022-03-04 ENCOUNTER — Ambulatory Visit: Payer: Medicare PPO | Admitting: Physical Therapy

## 2022-03-04 DIAGNOSIS — G8929 Other chronic pain: Secondary | ICD-10-CM

## 2022-03-04 DIAGNOSIS — M6281 Muscle weakness (generalized): Secondary | ICD-10-CM | POA: Diagnosis not present

## 2022-03-04 DIAGNOSIS — R252 Cramp and spasm: Secondary | ICD-10-CM | POA: Diagnosis not present

## 2022-03-04 DIAGNOSIS — M25551 Pain in right hip: Secondary | ICD-10-CM | POA: Diagnosis not present

## 2022-03-04 DIAGNOSIS — M545 Low back pain, unspecified: Secondary | ICD-10-CM | POA: Diagnosis not present

## 2022-03-04 NOTE — Therapy (Signed)
?OUTPATIENT PHYSICAL THERAPY TREATMENT NOTE ? ? ?Patient Name: Jesse [name] ?MRN: [id number] ?DOB:30-Jul-1951, 71 y.o., male ?Today's Date: 03/04/2022 ? ?PCP: Dr. Orland Mustard ?REFERRING PROVIDER: Dr. Tonita Cong ? ?END OF SESSION:  ? PT End of Session - 02/18/22 0850   ? ? Visit Number 5  ? Date for PT Re-Evaluation 04/13/22   ? Authorization Type BCBS; [employer] 16 visits until 6/20  ? PT Start Time 1101  ? PT Stop Time 1141  ? PT Time Calculation (min) 40 min   ? Activity Tolerance Patient tolerated treatment well   ? ?  ?  ? ?  ? ? ?Past Medical History:  ?Diagnosis Date  ? Arthritis   ? knees  ? Asthma   ? seasonal  ? Eczema   ? GERD (gastroesophageal reflux disease)   ? HLD (hyperlipidemia)   ? HTN (hypertension)   ? Pulmonary emboli ([place])   ? post op knee scope for a few months  ? Sleep apnea 2004  ? TA (temporal arteritis) ([place])   ? Wears glasses   ? ?Past Surgical History:  ?Procedure Laterality Date  ? ABCESS DRAINAGE  02/22/2013  ? i/d-rectal  ? COLONOSCOPY  06/2015  ? No polyps, done for family history of colon polyps Next would be 2026  ? EYE SURGERY Left age 31  ? lazy eye  ? INGUINAL HERNIA REPAIR Right 05/25/2013  ? Procedure: RIGHT INGUINAL HERNIA REPAIR  WITH MESH;  Surgeon: Imogene Burn. Georgette Dover, MD;  Location: [facility];  Service: General;  Laterality: Right;  ? INSERTION OF MESH Right 05/25/2013  ? Procedure: INSERTION OF MESH;  Surgeon: Imogene Burn. Georgette Dover, MD;  Location: [facility];  Service: General;  Laterality: Right;  ? NASAL SEPTUM SURGERY  10/26/2003  ? TONSILLECTOMY  71 years old  ? TOTAL KNEE ARTHROPLASTY Right 10/25/2010  ? rt total knee  ? TOTAL KNEE ARTHROPLASTY Left 03/03/2020  ? Procedure: TOTAL KNEE ARTHROPLASTY;  Surgeon: Gaynelle Arabian, MD;  Location: [facility];  Service: Orthopedics;  Laterality: Left;  51mn  ? TRACHEAL SURGERY  10/26/1983  ? emergency trach from throat infection  ? UMBILICAL HERNIA REPAIR  10/26/1999  ? WISDOM TOOTH EXTRACTION    ? ?Patient Active  Problem List  ? Diagnosis Date Noted  ? OA (osteoarthritis) of knee 03/03/2020  ? Murmur, cardiac 05/08/2014  ? RBBB 05/08/2014  ? Right inguinal hernia 03/06/2013  ? Abscess of right buttock 11/23/2012  ? ? ?REFERRING DIAG: back pain ? ?THERAPY DIAG:  ?Back pain ? ?PERTINENT HISTORY: right side lipoma; bil TKR ? ?PRECAUTIONS: none ? ?SUBJECTIVE:  Did some yardwork yesterday and some back tightness today.    ? PAIN:  ?Are you having pain? 0 ?NPRS scale: 0/10 (just stiff) ? ?OBJECTIVE: (objective measures completed at initial evaluation unless otherwise dated) ?DIAGNOSTIC FINDINGS:  ?X-ray degenerative disc disease ?  ?PATIENT SURVEYS:  ?FOTO 67% ?  ?COGNITION: ?          Overall cognitive status: Within functional limits for tasks assessed               ?           ?SENSATION: ?WFL ?  ?POSTURE:  ?WFLs ?  ?PALPATION: ?Lateral trunk lipoma; tender over hip greater trochanter ?  ?LUMBAR ROM:  ?  ?Active  A/PROM  ?02/16/2022  ?Flexion 70  ?Extension 15  ?Right lateral flexion 20  ?Left lateral flexion 20  ?Right rotation    ?Left rotation    ? (  Blank rows = not tested) ?  ?LE ROM:  WFLS ?LE MMT:  SLS on right < 3 sec ?  ?MMT Right ?02/16/2022 Left ?02/16/2022  ?Hip flexion 4+ 5  ?Hip extension 4+ 5  ?Hip abduction 4- 5  ?Hip adduction      ?Hip internal rotation      ?Hip external rotation      ?Knee flexion      ?Knee extension      ?Ankle dorsiflexion      ?Ankle plantarflexion      ?Ankle inversion      ?Ankle eversion      ? (Blank rows = not tested) ?  ?GAIT: ?Comments: WNLs ?  ?Guarded with rolling, moving on/off treatment table  ?  ?TODAY'S TREATMENT  ?5/11: ?NuStep old model L5 x8' seat 12 arms 10 ?Seated thoracic extension with ball 15x hands blocking neck/head  ?Seated blue ball roll out with bias ?Cat cow 15x ? ? ?Supine lower trunk rotation x 10 reps, hold active stretch x 10" last rep each way ?Hip windshield wipers x 10 ?Quadruped thoracic rotation 10x each way ?Sidelying open books 10x right/left  ?Hip  flexor stretch on stairs, dynamic rocking x 10 each side with arm raise overhead and reach over ?Trigger Point Dry-Needling  ?Treatment instructions: Expect mild to moderate muscle soreness. S/S of pneumothorax if dry needled over a lung field, and to seek immediate medical attention should they occur. Patient verbalized understanding of these instructions and education. ? ?Patient Consent Given: Yes ?Education handout provided: Previously provided ?Muscles treated: bil lumbar multifidi Rt, Rt glut min and med ?Electrical stimulation performed: No ?Parameters: N/A ?Treatment response/outcome: deep cramp and release ? ? ? ? ? ?03/02/22: ?NuStep L6x6' seat 12 arms 10 ?Supine piriformis stretch bil 1x30" ?Supine hamstring stretch with strap, add side to side bias for adductors and ITB,   ?      1x30" each, bil ?Supine lower trunk rotation x 10 reps, hold active stretch x 10" last rep each way ?Hip windshield wipers x 10 ?Quadruped thoracic rotation 10x each way ?Sidelying open books 10x right/left  ?Sit to stand in stagger stance with 5lb kbell chest press to trunk rotation over front foot ?Stagger stance 5lb kbell PNF diag lift with trunk rotation x 10 each ?Cable pulley PNF chop 10lb x 12 reps each side ?Hip flexor stretch on stairs, dynamic rocking x 10 each side ? ?02/25/22: ?NuStep L6x 6' seat 11 arms 10 PT present  ?Supine hamstring stretch with strap, add side to side bias for adductors and ITB, 1x30" each, bil ?Supine Rt piriformis stretch 1x30" ?Supine lower trunk rotation x 10 reps ?Quadruped thoracic rotation 10x each way ?Sidelying open books 10x right/left  ?Quadruped rocking, slow, PT cued open back of hip spreading SITS bones when sitting back ?Staggered feet standing PNF chops 7x each way ? ? ?02/23/22: ?NuStep L2 x 6' seat 11 arms 10 PT present  ?Supine hamstring stretch with strap, add side to side bias for adductors and ITB, 1x30" each, bil ?Supine Rt and left piriformis stretch 1x30" ?Supine lower trunk  rotation x 10 reps ?Supine hip isometrics alt Lt/Rt with abdominal bracing, PT cued exhale vs "brace" for dynamic indraw, 10 reps ?Quadruped rocking, slow, PT cued open back of hip spreading SITS bones when sitting back ?Hip flexor stretch standing foot on 2nd step, 2x10" each Rt/Lt ?STM to Rt lateral hip after DN (see below) ? ? ?Trigger Point Dry-Needling  ?Treatment instructions: Expect  mild to moderate muscle soreness. S/S of pneumothorax if dry needled over a lung field, and to seek immediate medical attention should they occur. Patient verbalized understanding of these instructions and education. ? ?Patient Consent Given: Yes ?Education handout provided: Previously provided ?Muscles treated: lumbar multifidi Rt, Rt glut min and med ?Electrical stimulation performed: No ?Parameters: N/A ?Treatment response/outcome: deep cramp and release ? ?Moist heat end of session Rt lateral hip and lumbar x 10' ?  ?  ?PATIENT EDUCATION:  ?Education details: treatment plan ?Person educated: Patient ?Education method: Explanation ?Education comprehension: verbalized understanding ?  ?  ?HOME EXERCISE PROGRAM: ?Access Code: [email or web address] ?URL: [email or web address] ?Date: 03/04/2022 ?Prepared by: Ruben Im ? ?Exercises ?- Seated Sidebending Arms Overhead  - 1 x daily - 7 x weekly - 1 sets - 2 reps - 20 hold ?- Quadruped Rocking Slow  - 1 x daily - 7 x weekly - 1 sets - 15 reps ?- Supine Double Knee to Chest  - 1 x daily - 7 x weekly - 1 sets - 60 reps ?- Supine Piriformis Stretch with Foot on Ground  - 1 x daily - 7 x weekly - 1 sets - 2 reps - 20 hold ?- Seated Thoracic Lumbar Extension with Pectoralis Stretch  - 1 x daily - 7 x weekly - 1 sets - 10 reps ?- doorway hip flexor stretch   - 1 x daily - 7 x weekly - 3 sets - 5 reps ?- Clamshell with Resistance  - 1 x daily - 7 x weekly - 2 sets - 10 reps ?- Bird Dog  - 1 x daily - 7 x weekly - 1 sets - 5 reps - 5 hold ?- Standing Hip Hinge with Dowel  - 1 x daily - 7  x weekly - 1 sets - 10 reps ?- Marching with Same-Side Arm Raise and Walker  - 1 x daily - 7 x weekly - 1 sets - 10 reps ?- Plank on Knees  - 1 x daily - 7 x weekly - 1 sets - 5 reps - 5 hold ?- Half Deadlift wit

## 2022-03-09 ENCOUNTER — Ambulatory Visit: Payer: Medicare PPO | Admitting: Physical Therapy

## 2022-03-09 DIAGNOSIS — M6281 Muscle weakness (generalized): Secondary | ICD-10-CM | POA: Diagnosis not present

## 2022-03-09 DIAGNOSIS — M25551 Pain in right hip: Secondary | ICD-10-CM

## 2022-03-09 DIAGNOSIS — R252 Cramp and spasm: Secondary | ICD-10-CM | POA: Diagnosis not present

## 2022-03-09 DIAGNOSIS — G8929 Other chronic pain: Secondary | ICD-10-CM | POA: Diagnosis not present

## 2022-03-09 DIAGNOSIS — M545 Low back pain, unspecified: Secondary | ICD-10-CM | POA: Diagnosis not present

## 2022-03-09 NOTE — Therapy (Signed)
?OUTPATIENT PHYSICAL THERAPY TREATMENT NOTE ? ? ?Patient Name: Jesse [name] ?MRN: [id number] ?DOB:[birth year], 71 y.o., male ?Today's Date: 03/09/2022 ? ?PCP: Dr. Orland Mustard ?REFERRING PROVIDER: Dr. Tonita Cong ? ?END OF SESSION:  ? PT End of Session - 02/18/22 0850   ? ? Visit Number 5  ? Date for PT Re-Evaluation 04/13/22   ? Authorization Type BCBS; [employer] 16 visits until 6/20  ? PT Start Time 1101  ? PT Stop Time 1141  ? PT Time Calculation (min) 40 min   ? Activity Tolerance Patient tolerated treatment well   ? ?  ?  ? ?  ? ? ?Past Medical History:  ?Diagnosis Date  ? Arthritis   ? knees  ? Asthma   ? seasonal  ? Eczema   ? GERD (gastroesophageal reflux disease)   ? HLD (hyperlipidemia)   ? HTN (hypertension)   ? Pulmonary emboli ([place])   ? post op knee scope for a few months  ? Sleep apnea 2004  ? TA (temporal arteritis) ([place])   ? Wears glasses   ? ?Past Surgical History:  ?Procedure Laterality Date  ? ABCESS DRAINAGE  02/22/2013  ? i/d-rectal  ? COLONOSCOPY  06/2015  ? No polyps, done for family history of colon polyps Next would be 2026  ? EYE SURGERY Left age 68  ? lazy eye  ? INGUINAL HERNIA REPAIR Right 05/25/2013  ? Procedure: RIGHT INGUINAL HERNIA REPAIR  WITH MESH;  Surgeon: Imogene Burn. Georgette Dover, MD;  Location: [place];  Service: General;  Laterality: Right;  ? INSERTION OF MESH Right 05/25/2013  ? Procedure: INSERTION OF MESH;  Surgeon: Imogene Burn. Georgette Dover, MD;  Location: [place];  Service: General;  Laterality: Right;  ? NASAL SEPTUM SURGERY  10/26/2003  ? TONSILLECTOMY  71 years old  ? TOTAL KNEE ARTHROPLASTY Right 10/25/2010  ? rt total knee  ? TOTAL KNEE ARTHROPLASTY Left 03/03/2020  ? Procedure: TOTAL KNEE ARTHROPLASTY;  Surgeon: Gaynelle Arabian, MD;  Location: [facility];  Service: Orthopedics;  Laterality: Left;  47mn  ? TRACHEAL SURGERY  10/26/1983  ? emergency trach from throat infection  ? UMBILICAL HERNIA REPAIR  10/26/1999  ? WISDOM TOOTH EXTRACTION    ? ?Patient Active  Problem List  ? Diagnosis Date Noted  ? OA (osteoarthritis) of knee 03/03/2020  ? Murmur, cardiac 05/08/2014  ? RBBB 05/08/2014  ? Right inguinal hernia 03/06/2013  ? Abscess of right buttock 11/23/2012  ? ? ?REFERRING DIAG: back pain ? ?THERAPY DIAG:  ?Back pain ? ?PERTINENT HISTORY: right side lipoma; bil TKR ? ?PRECAUTIONS: none ? ?SUBJECTIVE:  Did some yardwork yesterday and some back tightness today.    ? PAIN:  ?Are you having pain? 0 ?NPRS scale: 0/10 (just stiff) ? ?OBJECTIVE: (objective measures completed at initial evaluation unless otherwise dated) ?DIAGNOSTIC FINDINGS:  ?X-ray degenerative disc disease ?  ?PATIENT SURVEYS:  ?FOTO 67% ?  ?COGNITION: ?          Overall cognitive status: Within functional limits for tasks assessed               ?           ?SENSATION: ?WFL ?  ?POSTURE:  ?WFLs ?  ?PALPATION: ?Lateral trunk lipoma; tender over hip greater trochanter ?  ?LUMBAR ROM:  ?  ?Active  A/PROM  ?02/16/2022  ?Flexion 70  ?Extension 15  ?Right lateral flexion 20  ?Left lateral flexion 20  ?Right rotation    ?Left rotation    ? (  Blank rows = not tested) ?  ?LE ROM:  WFLS ?LE MMT:  SLS on right < 3 sec ?  ?MMT Right ?02/16/2022 Left ?02/16/2022  ?Hip flexion 4+ 5  ?Hip extension 4+ 5  ?Hip abduction 4- 5  ?Hip adduction      ?Hip internal rotation      ?Hip external rotation      ?Knee flexion      ?Knee extension      ?Ankle dorsiflexion      ?Ankle plantarflexion      ?Ankle inversion      ?Ankle eversion      ? (Blank rows = not tested) ?  ?GAIT: ?Comments: WNLs ?  ?Guarded with rolling, moving on/off treatment table  ?  ?TODAY'S TREATMENT  ?5/11: ?NuStep old model L5 x8' seat 12 arms 10 ?Seated thoracic extension with ball 15x hands blocking neck/head  ?Seated blue ball roll out with bias ?Cat cow 15x ? ? ?Supine lower trunk rotation x 10 reps, hold active stretch x 10" last rep each way ?Hip windshield wipers x 10 ?Quadruped thoracic rotation 10x each way ?Sidelying open books 10x right/left  ?Hip  flexor stretch on stairs, dynamic rocking x 10 each side with arm raise overhead and reach over ?Trigger Point Dry-Needling  ?Treatment instructions: Expect mild to moderate muscle soreness. S/S of pneumothorax if dry needled over a lung field, and to seek immediate medical attention should they occur. Patient verbalized understanding of these instructions and education. ? ?Patient Consent Given: Yes ?Education handout provided: Previously provided ?Muscles treated: bil lumbar multifidi Rt, Rt glut min and med ?Electrical stimulation performed: No ?Parameters: N/A ?Treatment response/outcome: deep cramp and release ? ? ? ? ? ?03/02/22: ?NuStep L6x6' seat 12 arms 10 ?Supine piriformis stretch bil 1x30" ?Supine hamstring stretch with strap, add side to side bias for adductors and ITB,   ?      1x30" each, bil ?Supine lower trunk rotation x 10 reps, hold active stretch x 10" last rep each way ?Hip windshield wipers x 10 ?Quadruped thoracic rotation 10x each way ?Sidelying open books 10x right/left  ?Sit to stand in stagger stance with 5lb kbell chest press to trunk rotation over front foot ?Stagger stance 5lb kbell PNF diag lift with trunk rotation x 10 each ?Cable pulley PNF chop 10lb x 12 reps each side ?Hip flexor stretch on stairs, dynamic rocking x 10 each side ? ?02/25/22: ?NuStep L6x 6' seat 11 arms 10 PT present  ?Supine hamstring stretch with strap, add side to side bias for adductors and ITB, 1x30" each, bil ?Supine Rt piriformis stretch 1x30" ?Supine lower trunk rotation x 10 reps ?Quadruped thoracic rotation 10x each way ?Sidelying open books 10x right/left  ?Quadruped rocking, slow, PT cued open back of hip spreading SITS bones when sitting back ?Staggered feet standing PNF chops 7x each way ? ? ?02/23/22: ?NuStep L2 x 6' seat 11 arms 10 PT present  ?Supine hamstring stretch with strap, add side to side bias for adductors and ITB, 1x30" each, bil ?Supine Rt and left piriformis stretch 1x30" ?Supine lower trunk  rotation x 10 reps ?Supine hip isometrics alt Lt/Rt with abdominal bracing, PT cued exhale vs "brace" for dynamic indraw, 10 reps ?Quadruped rocking, slow, PT cued open back of hip spreading SITS bones when sitting back ?Hip flexor stretch standing foot on 2nd step, 2x10" each Rt/Lt ?STM to Rt lateral hip after DN (see below) ? ? ?Trigger Point Dry-Needling  ?Treatment instructions: Expect  mild to moderate muscle soreness. S/S of pneumothorax if dry needled over a lung field, and to seek immediate medical attention should they occur. Patient verbalized understanding of these instructions and education. ? ?Patient Consent Given: Yes ?Education handout provided: Previously provided ?Muscles treated: lumbar multifidi Rt, Rt glut min and med ?Electrical stimulation performed: No ?Parameters: N/A ?Treatment response/outcome: deep cramp and release ? ?Moist heat end of session Rt lateral hip and lumbar x 10' ?  ?  ?PATIENT EDUCATION:  ?Education details: treatment plan ?Person educated: Patient ?Education method: Explanation ?Education comprehension: verbalized understanding ?  ?  ?HOME EXERCISE PROGRAM: ?Access Code: [email or web address] ?URL: [email or web address] ?Date: 03/04/2022 ?Prepared by: Ruben Im ? ?Exercises ?- Seated Sidebending Arms Overhead  - 1 x daily - 7 x weekly - 1 sets - 2 reps - 20 hold ?- Quadruped Rocking Slow  - 1 x daily - 7 x weekly - 1 sets - 15 reps ?- Supine Double Knee to Chest  - 1 x daily - 7 x weekly - 1 sets - 60 reps ?- Supine Piriformis Stretch with Foot on Ground  - 1 x daily - 7 x weekly - 1 sets - 2 reps - 20 hold ?- Seated Thoracic Lumbar Extension with Pectoralis Stretch  - 1 x daily - 7 x weekly - 1 sets - 10 reps ?- doorway hip flexor stretch   - 1 x daily - 7 x weekly - 3 sets - 5 reps ?- Clamshell with Resistance  - 1 x daily - 7 x weekly - 2 sets - 10 reps ?- Bird Dog  - 1 x daily - 7 x weekly - 1 sets - 5 reps - 5 hold ?- Standing Hip Hinge with Dowel  - 1 x daily - 7  x weekly - 1 sets - 10 reps ?- Marching with Same-Side Arm Raise and Walker  - 1 x daily - 7 x weekly - 1 sets - 10 reps ?- Plank on Knees  - 1 x daily - 7 x weekly - 1 sets - 5 reps - 5 hold ?- Half Deadlift wit

## 2022-03-11 ENCOUNTER — Ambulatory Visit: Payer: Medicare PPO | Admitting: Physical Therapy

## 2022-03-11 DIAGNOSIS — R252 Cramp and spasm: Secondary | ICD-10-CM | POA: Diagnosis not present

## 2022-03-11 DIAGNOSIS — G8929 Other chronic pain: Secondary | ICD-10-CM | POA: Diagnosis not present

## 2022-03-11 DIAGNOSIS — M545 Low back pain, unspecified: Secondary | ICD-10-CM | POA: Diagnosis not present

## 2022-03-11 DIAGNOSIS — M6281 Muscle weakness (generalized): Secondary | ICD-10-CM

## 2022-03-11 DIAGNOSIS — M25551 Pain in right hip: Secondary | ICD-10-CM | POA: Diagnosis not present

## 2022-03-11 NOTE — Therapy (Signed)
OUTPATIENT PHYSICAL THERAPY TREATMENT NOTE   Patient Name: Jesse [name] MRN: [id number] DOB:13-Apr-1951, 71 y.o., male Today's Date: 03/11/2022  PCP: Dr. Orland Mustard REFERRING PROVIDER: Dr. Tonita Cong  END OF SESSION:   PT End of Session - 02/18/22 0850     Visit Number 8   Date for PT Re-Evaluation 04/13/22    Authorization Type BCBS; [employer] 16 visits until 6/20   PT Start Time 932   PT Stop Time 1013   PT Time Calculation (min) 40 min    Activity Tolerance Patient tolerated treatment well             Past Medical History:  Diagnosis Date   Arthritis    knees   Asthma    seasonal   Eczema    GERD (gastroesophageal reflux disease)    HLD (hyperlipidemia)    HTN (hypertension)    Pulmonary emboli ([place])    post op knee scope for a few months   Sleep apnea 2004   TA (temporal arteritis) ([place])    Wears glasses    Past Surgical History:  Procedure Laterality Date   ABCESS DRAINAGE  02/22/2013   i/d-rectal   COLONOSCOPY  06/2015   No polyps, done for family history of colon polyps Next would be 2026   EYE SURGERY Left age 55   lazy eye   INGUINAL HERNIA REPAIR Right 05/25/2013   Procedure: RIGHT INGUINAL HERNIA REPAIR  WITH MESH;  Surgeon: Imogene Burn. Georgette Dover, MD;  Location: [place];  Service: General;  Laterality: Right;   INSERTION OF MESH Right 05/25/2013   Procedure: INSERTION OF MESH;  Surgeon: Imogene Burn. Georgette Dover, MD;  Location: [place];  Service: General;  Laterality: Right;   NASAL SEPTUM SURGERY  10/26/2003   TONSILLECTOMY  71 years old   TOTAL KNEE ARTHROPLASTY Right 10/25/2010   rt total knee   TOTAL KNEE ARTHROPLASTY Left 03/03/2020   Procedure: TOTAL KNEE ARTHROPLASTY;  Surgeon: Gaynelle Arabian, MD;  Location: [facility];  Service: Orthopedics;  Laterality: Left;  39mn   TRACHEAL SURGERY  10/26/1983   emergency trach from throat infection   UMBILICAL HERNIA REPAIR  10/26/1999   WISDOM TOOTH EXTRACTION     Patient Active Problem  List   Diagnosis Date Noted   OA (osteoarthritis) of knee 03/03/2020   Murmur, cardiac 05/08/2014   RBBB 05/08/2014   Right inguinal hernia 03/06/2013   Abscess of right buttock 11/23/2012    REFERRING DIAG: back pain  THERAPY DIAG:  Back pain  PERTINENT HISTORY: right side lipoma; bil TKR  PRECAUTIONS: none  SUBJECTIVE:  Played 9 holes of golf yesterday.  Denies soreness after last session.  Less back stiffness.  PAIN:  Are you having pain? 0 NPRS scale: 0/10 (just stiff)  OBJECTIVE: (objective measures completed at initial evaluation unless otherwise dated) DIAGNOSTIC FINDINGS:  X-ray degenerative disc disease   PATIENT SURVEYS:  FOTO 67%   COGNITION:           Overall cognitive status: Within functional limits for tasks assessed                          SENSATION: WFL   POSTURE:  WFLs   PALPATION: Lateral trunk lipoma; tender over hip greater trochanter   LUMBAR ROM:    Active  A/PROM  02/16/2022  Flexion 70  Extension 15  Right lateral flexion 20  Left lateral flexion 20  Right rotation  Left rotation     (Blank rows = not tested)   LE ROM:  WFLS LE MMT:  SLS on right < 3 sec   MMT Right 02/16/2022 Left 02/16/2022  Hip flexion 4+ 5  Hip extension 4+ 5  Hip abduction 4- 5  Hip adduction      Hip internal rotation      Hip external rotation      Knee flexion      Knee extension      Ankle dorsiflexion      Ankle plantarflexion      Ankle inversion      Ankle eversion       (Blank rows = not tested)   GAIT: Comments: WNLs   Guarded with rolling, moving on/off treatment table    TODAY'S TREATMENT  5/18: Hip flexor stretch on stairs, dynamic rocking x 10 each side with arm raise overhead and reach over Nustep new model L6 6 min Standing lat bar pull downs 45# 2x10 Kickstand position green band [place] style: press out, press up, stir the pot each way 5x each Squats holding 10# kettlebell hover above mat 10x 2 rounds  Dead lifts 20# to  corner of 6 inch box 10x Facing wall runners hip flexion 10x each side Cable pulley PNF chop 10lb x 12 reps each side Therapeutic activities for standing, walking, lifting and motions needed for golf       5/11: NuStep old model L5 x8' seat 12 arms 10 Seated thoracic extension with ball 15x hands blocking neck/head  Seated blue ball roll out with bias Cat cow 15x   Supine lower trunk rotation x 10 reps, hold active stretch x 10" last rep each way Hip windshield wipers x 10 Quadruped thoracic rotation 10x each way Sidelying open books 10x right/left  Hip flexor stretch on stairs, dynamic rocking x 10 each side with arm raise overhead and reach over Trigger Point Dry-Needling  Treatment instructions: Expect mild to moderate muscle soreness. S/S of pneumothorax if dry needled over a lung field, and to seek immediate medical attention should they occur. Patient verbalized understanding of these instructions and education.  Patient Consent Given: Yes Education handout provided: Previously provided Muscles treated: bil lumbar multifidi Rt, Rt glut min and med Electrical stimulation performed: No Parameters: N/A Treatment response/outcome: deep cramp and release      03/02/22: NuStep L6x6' seat 12 arms 10 Supine piriformis stretch bil 1x30" Supine hamstring stretch with strap, add side to side bias for adductors and ITB,         1x30" each, bil Supine lower trunk rotation x 10 reps, hold active stretch x 10" last rep each way Hip windshield wipers x 10 Quadruped thoracic rotation 10x each way Sidelying open books 10x right/left  Sit to stand in stagger stance with 5lb kbell chest press to trunk rotation over front foot Stagger stance 5lb kbell PNF diag lift with trunk rotation x 10 each Cable pulley PNF chop 10lb x 12 reps each side Hip flexor stretch on stairs, dynamic rocking x 10 each side  02/25/22: NuStep L6x 6' seat 11 arms 10 PT present  Supine hamstring stretch with  strap, add side to side bias for adductors and ITB, 1x30" each, bil Supine Rt piriformis stretch 1x30" Supine lower trunk rotation x 10 reps Quadruped thoracic rotation 10x each way Sidelying open books 10x right/left  Quadruped rocking, slow, PT cued open back of hip spreading SITS bones when sitting back Staggered feet standing PNF  chops 7x each way   02/23/22: NuStep L2 x 6' seat 11 arms 10 PT present  Supine hamstring stretch with strap, add side to side bias for adductors and ITB, 1x30" each, bil Supine Rt and left piriformis stretch 1x30" Supine lower trunk rotation x 10 reps Supine hip isometrics alt Lt/Rt with abdominal bracing, PT cued exhale vs "brace" for dynamic indraw, 10 reps Quadruped rocking, slow, PT cued open back of hip spreading SITS bones when sitting back Hip flexor stretch standing foot on 2nd step, 2x10" each Rt/Lt STM to Rt lateral hip after DN (see below)   Trigger Point Dry-Needling  Treatment instructions: Expect mild to moderate muscle soreness. S/S of pneumothorax if dry needled over a lung field, and to seek immediate medical attention should they occur. Patient verbalized understanding of these instructions and education.  Patient Consent Given: Yes Education handout provided: Previously provided Muscles treated: lumbar multifidi Rt, Rt glut min and med Electrical stimulation performed: No Parameters: N/A Treatment response/outcome: deep cramp and release  Moist heat end of session Rt lateral hip and lumbar x 10'     PATIENT EDUCATION:  Education details: treatment plan Person educated: Patient Education method: Explanation Education comprehension: verbalized understanding     HOME EXERCISE PROGRAM: Access Code: [email or web address] URL: [email or web address] Date: 03/04/2022 Prepared by: Ruben Im  Exercises - Seated Sidebending Arms Overhead  - 1 x daily - 7 x weekly - 1 sets - 2 reps - 20 hold - Quadruped Rocking Slow  - 1 x daily  - 7 x weekly - 1 sets - 15 reps - Supine Double Knee to Chest  - 1 x daily - 7 x weekly - 1 sets - 60 reps - Supine Piriformis Stretch with Foot on Ground  - 1 x daily - 7 x weekly - 1 sets - 2 reps - 20 hold - Seated Thoracic Lumbar Extension with Pectoralis Stretch  - 1 x daily - 7 x weekly - 1 sets - 10 reps - doorway hip flexor stretch   - 1 x daily - 7 x weekly - 3 sets - 5 reps - Clamshell with Resistance  - 1 x daily - 7 x weekly - 2 sets - 10 reps - Bird Dog  - 1 x daily - 7 x weekly - 1 sets - 5 reps - 5 hold - Standing Hip Hinge with Dowel  - 1 x daily - 7 x weekly - 1 sets - 10 reps - Marching with Same-Side Arm Raise and Walker  - 1 x daily - 7 x weekly - 1 sets - 10 reps - Plank on Knees  - 1 x daily - 7 x weekly - 1 sets - 5 reps - 5 hold - Half Deadlift with Kettlebell  - 1 x daily - 7 x weekly - 1 sets - 10 reps - Forward Step Down Touch with Heel  - 1 x daily - 7 x weekly - 1 sets - 10 reps - Marching with Same-Side Arm Raise  - 1 x daily - 7 x weekly - 1 sets - 10 reps - Standing Anti-Rotation Press with Anchored Resistance  - 1 x daily - 7 x weekly - 5 sets - 15 reps - Insurance claims handler (Mirrored)  - 1 x daily - 7 x weekly - 1 sets - 10 reps - SNATCH AND PRESS OVERHEAD  - 1 x daily - 7 x weekly - 1 sets - 10 reps - Side Plank on Knees  -  1 x daily - 7 x weekly - 1 sets - 10 reps - Supine Lower Trunk Rotation  - 1 x daily - 7 x weekly - 1 sets - 10 reps - Hooklying Hamstring Stretch with Strap  - 1 x daily - 7 x weekly - 1 sets - 5 reps - Hooklying Isometric Hip Flexion with Opposite Arm  - 1 x daily - 7 x weekly - 1 sets - 10 reps - Hip Flexor Stretch with Chair  - 1 x daily - 7 x weekly - 1 sets - 10 reps - Shoulder extension with resistance - Neutral  - 1 x daily - 7 x weekly - 1 sets - 10 reps - Single-Leg Anti-Rotation Press With Anchored Resistance  - 1 x daily - 7 x weekly - 1 sets - 10 reps - Sidelying Open Book Thoracic Rotation with Knee on Foam Roll  - 1 x  daily - 7 x weekly - 1 sets - 10 reps - Quadruped Thoracic Rotation Full Range with Hand on Neck  - 1 x daily - 7 x weekly - 3 sets - 10 reps - Standing Diagonal Chops with Medicine Ball  - 1 x daily - 7 x weekly - 3 sets - 10 reps - Reverse Chop with Elbows Straight  - 1 x daily - 7 x weekly - 1 sets - 10 reps - Supine Hip Internal and External Rotation  - 1 x daily - 7 x weekly - 2 sets - 10 reps - Cat Cow  - 1 x daily - 7 x weekly - 1 sets - 10 reps ASSESSMENT:   CLINICAL IMPRESSION:  Able to increase exercise intensity for lumbo-pelvic-hip strengthening with increased loads and ex tempo.  He denies pain and lessening back stiffness.  Therapist monitoring response and ensuring hip hinge form with lifting.    OBJECTIVE IMPAIRMENTS decreased ROM, decreased strength, increased fascial restrictions, and pain.    ACTIVITY LIMITATIONS community activity, meal prep, yard work, and shopping.    PERSONAL FACTORS Past/current experiences are also affecting patient's functional outcome.      REHAB POTENTIAL: Good   CLINICAL DECISION MAKING: Stable/uncomplicated   EVALUATION COMPLEXITY: Low     GOALS: Goals reviewed with patient? Yes   SHORT TERM GOALS: Target date: 03/16/2022   The patient will demonstrate knowledge of basic self care strategies and exercises to promote healing   Baseline: Goal status: INITIAL   2.  The patient will report a 50% improvement in pain levels with functional activities which are currently difficult including lying down and with morning time ADLS Baseline:  Goal status: INITIAL   3.  The patient will be report a 50% improvement in sleep Baseline:  Goal status: INITIAL     LONG TERM GOALS: Target date: 04/13/2022   The patient will be independent in a safe self progression of a home exercise program to promote further recovery of function   Baseline:  Goal status: INITIAL   2.  The patient will report a 75% improvement in pain levels with functional  activities which are currently difficult including  Baseline:  Goal status: INITIAL   3.  The patient will have improved trunk flexor and extensor muscle strength to at least 4+/5 needed for lifting medium weight objects such as grocery bags, laundry and luggage  Baseline:  Goal status: INITIAL   4.  The patient will have improved right hip strength to at least 4+/5 needed for standing, walking longer distances and descending  stairs at home and in the community and return to golf Baseline:  Goal status: INITIAL   5.  FOTO score improved from 67% to 74% Baseline:  Goal status: INITIAL     PLAN: PT FREQUENCY: 2x/week   PT DURATION: 8 weeks   PLANNED INTERVENTIONS: Therapeutic exercises, Therapeutic activity, Neuromuscular re-education, Balance training, Gait training, Patient/Family education, Joint mobilization, Aquatic Therapy, Dry Needling, Electrical stimulation, Spinal mobilization, Moist heat, Traction, Ultrasound, Ionotophoresis '4mg'$ /ml Dexamethasone, and Manual therapy.   PLAN FOR NEXT SESSION:  progressive load strengthening for core/hip; continue DN combined with ES to right low back and glutes;  right hip and lumbar mobs and soft tissue work;  Engineer, technical sales and hip mobility   Ruben Im, PT 03/11/22 10:12 AM Phone: [phone] Fax: [phone]

## 2022-03-16 ENCOUNTER — Ambulatory Visit: Payer: Medicare PPO | Admitting: Physical Therapy

## 2022-03-16 DIAGNOSIS — M25551 Pain in right hip: Secondary | ICD-10-CM | POA: Diagnosis not present

## 2022-03-16 DIAGNOSIS — G8929 Other chronic pain: Secondary | ICD-10-CM

## 2022-03-16 DIAGNOSIS — M6281 Muscle weakness (generalized): Secondary | ICD-10-CM | POA: Diagnosis not present

## 2022-03-16 DIAGNOSIS — R252 Cramp and spasm: Secondary | ICD-10-CM | POA: Diagnosis not present

## 2022-03-16 DIAGNOSIS — M545 Low back pain, unspecified: Secondary | ICD-10-CM | POA: Diagnosis not present

## 2022-03-16 NOTE — Therapy (Signed)
OUTPATIENT PHYSICAL THERAPY TREATMENT NOTE   Patient Name: Jesse [name] MRN: [id number] DOB:December 24, 1950, 71 y.o., male Today's Date: 03/16/2022  PCP: Dr. Orland Mustard REFERRING PROVIDER: Dr. Tonita Cong  END OF SESSION:   PT End of Session - 02/18/22 0850     Visit Number 9   Date for PT Re-Evaluation 04/13/22    Authorization Type [employer]; [employer] 16 visits until 6/20   PT Start Time 1103   PT Stop Time 1144   PT Time Calculation (min) 40 min    Activity Tolerance Patient tolerated treatment well             Past Medical History:  Diagnosis Date   Arthritis    knees   Asthma    seasonal   Eczema    GERD (gastroesophageal reflux disease)    HLD (hyperlipidemia)    HTN (hypertension)    Pulmonary emboli ([facility])    post op knee scope for a few months   Sleep apnea 2004   TA (temporal arteritis) ([place])    Wears glasses    Past Surgical History:  Procedure Laterality Date   ABCESS DRAINAGE  02/22/2013   i/d-rectal   COLONOSCOPY  06/2015   No polyps, done for family history of colon polyps Next would be 2026   EYE SURGERY Left age 31   lazy eye   INGUINAL HERNIA REPAIR Right 05/25/2013   Procedure: RIGHT INGUINAL HERNIA REPAIR  WITH MESH;  Surgeon: Imogene Burn. Georgette Dover, MD;  Location: [facility];  Service: General;  Laterality: Right;   INSERTION OF MESH Right 05/25/2013   Procedure: INSERTION OF MESH;  Surgeon: Imogene Burn. Georgette Dover, MD;  Location: [facility];  Service: General;  Laterality: Right;   NASAL SEPTUM SURGERY  10/26/2003   TONSILLECTOMY  71 years old   TOTAL KNEE ARTHROPLASTY Right 10/25/2010   rt total knee   TOTAL KNEE ARTHROPLASTY Left 03/03/2020   Procedure: TOTAL KNEE ARTHROPLASTY;  Surgeon: Gaynelle Arabian, MD;  Location: [facility];  Service: Orthopedics;  Laterality: Left;  41min   TRACHEAL SURGERY  10/26/1983   emergency trach from throat infection   UMBILICAL HERNIA REPAIR  10/26/1999   WISDOM TOOTH EXTRACTION     Patient Active  Problem List   Diagnosis Date Noted   OA (osteoarthritis) of knee 03/03/2020   Murmur, cardiac 05/08/2014   RBBB 05/08/2014   Right inguinal hernia 03/06/2013   Abscess of right buttock 11/23/2012    REFERRING DIAG: back pain  THERAPY DIAG:  Back pain  PERTINENT HISTORY: right side lipoma; bil TKR  PRECAUTIONS: none  SUBJECTIVE:  Played 9 holes of Sunday.  Doing well.  Had a neck crick but better after heat.    PAIN:  Are you having pain? 0 NPRS scale: 0/10 (just stiff)  OBJECTIVE: (objective measures completed at initial evaluation unless otherwise dated) DIAGNOSTIC FINDINGS:  X-ray degenerative disc disease   PATIENT SURVEYS:  FOTO 67%   COGNITION:           Overall cognitive status: Within functional limits for tasks assessed                          SENSATION: WFL   POSTURE:  WFLs   PALPATION: Lateral trunk lipoma; tender over hip greater trochanter   LUMBAR ROM:    Active  A/PROM  02/16/2022  Flexion 70  Extension 15  Right lateral flexion 20  Left lateral flexion 20  Right  rotation    Left rotation     (Blank rows = not tested)   LE ROM:  WFLS LE MMT:  SLS on right < 3 sec   MMT Right 02/16/2022 Left 02/16/2022  Hip flexion 4+ 5  Hip extension 4+ 5  Hip abduction 4- 5  Hip adduction      Hip internal rotation      Hip external rotation      Knee flexion      Knee extension      Ankle dorsiflexion      Ankle plantarflexion      Ankle inversion      Ankle eversion       (Blank rows = not tested)   GAIT: Comments: WNLs   Guarded with rolling, moving on/off treatment table    TODAY'S TREATMENT  5/23: Hip flexor stretch on stairs, dynamic rocking x 10 each side with arm raise overhead and reach over Nustep old model L4 6 min Split squat blue band trunk rotation 15x each way; Standing lat bar pull downs 45# 2x10 Staggered stance 10# reach to toe 10x right/left; Squats holding 10# kettlebell hover chair 10x 2 rounds  Dead lifts 25# to  tall cone  between feet 10x Plank on mat table opp knee touch 10x each side   Therapeutic activities for standing, walking, lifting and motions needed for golf      5/18: Hip flexor stretch on stairs, dynamic rocking x 10 each side with arm raise overhead and reach over Nustep new model L6 6 min Standing lat bar pull downs 45# 2x10 Kickstand position green band Pall[place] style: press out, press up, stir the pot each way 5x each Squats holding 10# kettlebell hover above mat 10x 2 rounds  Dead lifts 20# to corner of 6 inch box 10x Facing wall runners hip flexion 10x each side Cable pulley PNF chop 10lb x 12 reps each side Therapeutic activities for standing, walking, lifting and motions needed for golf       5/11: NuStep old model L5 x8' seat 12 arms 10 Seated thoracic extension with ball 15x hands blocking neck/head  Seated blue ball roll out with bias Cat cow 15x   Supine lower trunk rotation x 10 reps, hold active stretch x 10" last rep each way Hip windshield wipers x 10 Quadruped thoracic rotation 10x each way Sidelying open books 10x right/left  Hip flexor stretch on stairs, dynamic rocking x 10 each side with arm raise overhead and reach over Trigger Point Dry-Needling  Treatment instructions: Expect mild to moderate muscle soreness. S/S of pneumothorax if dry needled over a lung field, and to seek immediate medical attention should they occur. Patient verbalized understanding of these instructions and education.  Patient Consent Given: Yes Education handout provided: Previously provided Muscles treated: bil lumbar multifidi Rt, Rt glut min and med Electrical stimulation performed: No Parameters: N/A Treatment response/outcome: deep cramp and release      03/02/22: NuStep L6x6' seat 12 arms 10 Supine piriformis stretch bil 1x30" Supine hamstring stretch with strap, add side to side bias for adductors and ITB,         1x30" each, bil Supine lower trunk rotation  x 10 reps, hold active stretch x 10" last rep each way Hip windshield wipers x 10 Quadruped thoracic rotation 10x each way Sidelying open books 10x right/left  Sit to stand in stagger stance with 5lb kbell chest press to trunk rotation over front foot Stagger stance 5lb kbell PNF  diag lift with trunk rotation x 10 each Cable pulley PNF chop 10lb x 12 reps each side Hip flexor stretch on stairs, dynamic rocking x 10 each side  02/25/22: NuStep L6x 6' seat 11 arms 10 PT present  Supine hamstring stretch with strap, add side to side bias for adductors and ITB, 1x30" each, bil Supine Rt piriformis stretch 1x30" Supine lower trunk rotation x 10 reps Quadruped thoracic rotation 10x each way Sidelying open books 10x right/left  Quadruped rocking, slow, PT cued open back of hip spreading SITS bones when sitting back Staggered feet standing PNF chops 7x each way   02/23/22: NuStep L2 x 6' seat 11 arms 10 PT present  Supine hamstring stretch with strap, add side to side bias for adductors and ITB, 1x30" each, bil Supine Rt and left piriformis stretch 1x30" Supine lower trunk rotation x 10 reps Supine hip isometrics alt Lt/Rt with abdominal bracing, PT cued exhale vs "brace" for dynamic indraw, 10 reps Quadruped rocking, slow, PT cued open back of hip spreading SITS bones when sitting back Hip flexor stretch standing foot on 2nd step, 2x10" each Rt/Lt STM to Rt lateral hip after DN (see below)   Trigger Point Dry-Needling  Treatment instructions: Expect mild to moderate muscle soreness. S/S of pneumothorax if dry needled over a lung field, and to seek immediate medical attention should they occur. Patient verbalized understanding of these instructions and education.  Patient Consent Given: Yes Education handout provided: Previously provided Muscles treated: lumbar multifidi Rt, Rt glut min and med Electrical stimulation performed: No Parameters: N/A Treatment response/outcome: deep cramp and  release  Moist heat end of session Rt lateral hip and lumbar x 10'     PATIENT EDUCATION:  Education details: treatment plan Person educated: Patient Education method: Explanation Education comprehension: verbalized understanding     HOME EXERCISE PROGRAM: Access Code: [email or web address] URL: [email or web address] Date: 03/04/2022 Prepared by: Ruben Im  Exercises - Seated Sidebending Arms Overhead  - 1 x daily - 7 x weekly - 1 sets - 2 reps - 20 hold - Quadruped Rocking Slow  - 1 x daily - 7 x weekly - 1 sets - 15 reps - Supine Double Knee to Chest  - 1 x daily - 7 x weekly - 1 sets - 60 reps - Supine Piriformis Stretch with Foot on Ground  - 1 x daily - 7 x weekly - 1 sets - 2 reps - 20 hold - Seated Thoracic Lumbar Extension with Pectoralis Stretch  - 1 x daily - 7 x weekly - 1 sets - 10 reps - doorway hip flexor stretch   - 1 x daily - 7 x weekly - 3 sets - 5 reps - Clamshell with Resistance  - 1 x daily - 7 x weekly - 2 sets - 10 reps - Bird Dog  - 1 x daily - 7 x weekly - 1 sets - 5 reps - 5 hold - Standing Hip Hinge with Dowel  - 1 x daily - 7 x weekly - 1 sets - 10 reps - Marching with Same-Side Arm Raise and Walker  - 1 x daily - 7 x weekly - 1 sets - 10 reps - Plank on Knees  - 1 x daily - 7 x weekly - 1 sets - 5 reps - 5 hold - Half Deadlift with Kettlebell  - 1 x daily - 7 x weekly - 1 sets - 10 reps - Forward Step Down Touch with Heel  - 1  x daily - 7 x weekly - 1 sets - 10 reps - Marching with Same-Side Arm Raise  - 1 x daily - 7 x weekly - 1 sets - 10 reps - Standing Anti-Rotation Press with Anchored Resistance  - 1 x daily - 7 x weekly - 5 sets - 15 reps - Insurance claims handler (Mirrored)  - 1 x daily - 7 x weekly - 1 sets - 10 reps - SNATCH AND PRESS OVERHEAD  - 1 x daily - 7 x weekly - 1 sets - 10 reps - Side Plank on Knees  - 1 x daily - 7 x weekly - 1 sets - 10 reps - Supine Lower Trunk Rotation  - 1 x daily - 7 x weekly - 1 sets - 10 reps -  Hooklying Hamstring Stretch with Strap  - 1 x daily - 7 x weekly - 1 sets - 5 reps - Hooklying Isometric Hip Flexion with Opposite Arm  - 1 x daily - 7 x weekly - 1 sets - 10 reps - Hip Flexor Stretch with Chair  - 1 x daily - 7 x weekly - 1 sets - 10 reps - Shoulder extension with resistance - Neutral  - 1 x daily - 7 x weekly - 1 sets - 10 reps - Single-Leg Anti-Rotation Press With Anchored Resistance  - 1 x daily - 7 x weekly - 1 sets - 10 reps - Sidelying Open Book Thoracic Rotation with Knee on Foam Roll  - 1 x daily - 7 x weekly - 1 sets - 10 reps - Quadruped Thoracic Rotation Full Range with Hand on Neck  - 1 x daily - 7 x weekly - 3 sets - 10 reps - Standing Diagonal Chops with Medicine Ball  - 1 x daily - 7 x weekly - 3 sets - 10 reps - Reverse Chop with Elbows Straight  - 1 x daily - 7 x weekly - 1 sets - 10 reps - Supine Hip Internal and External Rotation  - 1 x daily - 7 x weekly - 2 sets - 10 reps - Cat Cow  - 1 x daily - 7 x weekly - 1 sets - 10 reps ASSESSMENT:   CLINICAL IMPRESSION:  Able to progress with core and trunk strengthening exercises without pain.  Verbal cues to keep load closer to body and to bend knees with lifting.  On track with rehab goals with all STGs met.      OBJECTIVE IMPAIRMENTS decreased ROM, decreased strength, increased fascial restrictions, and pain.    ACTIVITY LIMITATIONS community activity, meal prep, yard work, and shopping.    PERSONAL FACTORS Past/current experiences are also affecting patient's functional outcome.      REHAB POTENTIAL: Good   CLINICAL DECISION MAKING: Stable/uncomplicated   EVALUATION COMPLEXITY: Low     GOALS: Goals reviewed with patient? Yes   SHORT TERM GOALS: Target date: 03/16/2022   The patient will demonstrate knowledge of basic self care strategies and exercises to promote healing   Baseline: Goal status: met  2.  The patient will report a 50% improvement in pain levels with functional activities which are  currently difficult including lying down and with morning time ADLS Baseline:  Goal status: met   3.  The patient will be report a 50% improvement in sleep Baseline:  Goal status: met     LONG TERM GOALS: Target date: 04/13/2022   The patient will be independent in a safe self progression of a  home exercise program to promote further recovery of function   Baseline:  Goal status: INITIAL   2.  The patient will report a 75% improvement in pain levels with functional activities which are currently difficult including  Baseline:  Goal status: INITIAL   3.  The patient will have improved trunk flexor and extensor muscle strength to at least 4+/5 needed for lifting medium weight objects such as grocery bags, laundry and luggage  Baseline:  Goal status: INITIAL   4.  The patient will have improved right hip strength to at least 4+/5 needed for standing, walking longer distances and descending stairs at home and in the community and return to golf Baseline:  Goal status: INITIAL   5.  FOTO score improved from 67% to 74% Baseline:  Goal status: INITIAL     PLAN: PT FREQUENCY: 2x/week   PT DURATION: 8 weeks   PLANNED INTERVENTIONS: Therapeutic exercises, Therapeutic activity, Neuromuscular re-education, Balance training, Gait training, Patient/Family education, Joint mobilization, Aquatic Therapy, Dry Needling, Electrical stimulation, Spinal mobilization, Moist heat, Traction, Ultrasound, Ionotophoresis 44m/ml Dexamethasone, and Manual therapy.   PLAN FOR NEXT SESSION:  10th visit progress note; FOTO; recheck ROM;  progressive load strengthening for core/hip; continue DN combined with ES to right low back and glutes;  right hip and lumbar mobs and soft tissue work;  sEngineer, technical salesand hip mobility  SRuben Im PT 03/16/22 12:09 PM Phone: [phone]: [phone]

## 2022-03-18 ENCOUNTER — Encounter: Payer: Self-pay | Admitting: Physical Therapy

## 2022-03-18 ENCOUNTER — Ambulatory Visit: Payer: Medicare PPO | Admitting: Physical Therapy

## 2022-03-18 DIAGNOSIS — M25551 Pain in right hip: Secondary | ICD-10-CM | POA: Diagnosis not present

## 2022-03-18 DIAGNOSIS — G8929 Other chronic pain: Secondary | ICD-10-CM | POA: Diagnosis not present

## 2022-03-18 DIAGNOSIS — M6281 Muscle weakness (generalized): Secondary | ICD-10-CM

## 2022-03-18 DIAGNOSIS — M545 Low back pain, unspecified: Secondary | ICD-10-CM | POA: Diagnosis not present

## 2022-03-18 DIAGNOSIS — R252 Cramp and spasm: Secondary | ICD-10-CM | POA: Diagnosis not present

## 2022-03-18 NOTE — Therapy (Signed)
OUTPATIENT PHYSICAL THERAPY TREATMENT NOTE  Progress Note Reporting Period 02/16/22 to 03/18/22  See note below for Objective Data and Assessment of Progress/Goals.    Patient Name: Jesse [name] MRN: [id number] DOB:December 21, 1950, 71 y.o., male Today's Date: 03/18/2022  PCP: Dr. Kateri Plummer REFERRING PROVIDER: Dr. Shelle Iron  END OF SESSION:   PT End of Session - 02/18/22 0850     Visit Number 10   Date for PT Re-Evaluation 04/13/22    Authorization Type BCBS; [employer] 16 visits until 6/20   PT Start Time 1015   PT Stop Time 1057   PT Time Calculation (min) 42 min    Activity Tolerance Patient tolerated treatment well             Past Medical History:  Diagnosis Date   Arthritis    knees   Asthma    seasonal   Eczema    GERD (gastroesophageal reflux disease)    HLD (hyperlipidemia)    HTN (hypertension)    Pulmonary emboli (HCC)    post op knee scope for a few months   Sleep apnea 2004   TA (temporal arteritis) (HCC)    Wears glasses    Past Surgical History:  Procedure Laterality Date   ABCESS DRAINAGE  02/22/2013   i/d-rectal   COLONOSCOPY  06/2015   No polyps, done for family history of colon polyps Next would be 2026   EYE SURGERY Left age 45   lazy eye   INGUINAL HERNIA REPAIR Right 05/25/2013   Procedure: RIGHT INGUINAL HERNIA REPAIR  WITH MESH;  Surgeon: Wilmon Arms. Corliss Skains, MD;  Location: [facility];  Service: General;  Laterality: Right;   INSERTION OF MESH Right 05/25/2013   Procedure: INSERTION OF MESH;  Surgeon: Wilmon Arms. Corliss Skains, MD;  Location: [facility];  Service: General;  Laterality: Right;   NASAL SEPTUM SURGERY  10/26/2003   TONSILLECTOMY  71 years old   TOTAL KNEE ARTHROPLASTY Right 10/25/2010   rt total knee   TOTAL KNEE ARTHROPLASTY Left 03/03/2020   Procedure: TOTAL KNEE ARTHROPLASTY;  Surgeon: Ollen Gross, MD;  Location: [facility];  Service: Orthopedics;  Laterality: Left;    TRACHEAL SURGERY  10/26/1983    emergency trach from throat infection   UMBILICAL HERNIA REPAIR  10/26/1999   WISDOM TOOTH EXTRACTION     Patient Active Problem List   Diagnosis Date Noted   OA (osteoarthritis) of knee 03/03/2020   Murmur, cardiac 05/08/2014   RBBB 05/08/2014   Right inguinal hernia 03/06/2013   Abscess of right buttock 11/23/2012    REFERRING DIAG: back pain  THERAPY DIAG:  Back pain  PERTINENT HISTORY: right side lipoma; bil TKR  PRECAUTIONS: none  SUBJECTIVE:  Pt reports pain is 100% improved.  Still working on flexibility/mobility.  I've learned great tools for improving my mobility during the day.  I'm stiff today b/c I didn't stretch yesterday.     PAIN:  Are you having pain? 0 NPRS scale: 0/10 (just stiff)  OBJECTIVE: (objective measures completed at initial evaluation unless otherwise dated) DIAGNOSTIC FINDINGS:  X-ray degenerative disc disease   PATIENT SURVEYS:  03/18/22: FOTO 75% (GOAL WAS 74%, GOAL MET) 02/16/22: FOTO 67%   COGNITION:           Overall cognitive status: Within functional limits for tasks assessed                          SENSATION: [facility]  POSTURE:  WFLs   PALPATION: Lateral trunk lipoma; tender over hip greater trochanter   LUMBAR ROM:    Active  A/PROM  02/16/2022 A/ROM 03/18/22  Flexion 70 FULL fingers to toes no pain  Extension 15 20  Right lateral flexion 20 25 slight Rt sided discomfort  Left lateral flexion 20 25 Rt stretch  Right rotation   Full no pain  Left rotation   Full no pain   (Blank rows = not tested)   LE ROM:  WFLS  LE MMT:   03/18/22: SLS on right 16 sec before LOB 02/16/22: SLS on right < 3 sec   MMT Right 02/16/2022 Left 02/16/2022 Right 03/18/24  Hip flexion 4+ 5 5  Hip extension 4+ 5 5  Hip abduction 4- 5 5  Hip adduction       Hip internal rotation       Hip external rotation       Knee flexion       Knee extension       Ankle dorsiflexion       Ankle plantarflexion       Ankle inversion       Ankle eversion         (Blank rows = not tested)   GAIT: Comments: WNLs   Guarded with rolling, moving on/off treatment table    TODAY'S TREATMENT  03/18/22 Nustep old model L4 x 6' Hip flexor stretch on stairs, dynamic rocking x 10 each side with arm raise overhead and reach over Seated hamstring stretch 2x20" bil Squat to chair touch hold 10lb kbell 2x10 Dead lifts 25# to tall cone  between feet 10x Standing lat bar pull downs 45# 2x10 Mat table plank opp shoulder taps x 10, slow mountain climber knees x 10 Red physioball on wall elbow plank move ball up/down and Lt/Rt in small pulses x 20 each Supine windshield wipers x 20 Supine piriformis stretch 2x20"    5/23: Hip flexor stretch on stairs, dynamic rocking x 10 each side with arm raise overhead and reach over Nustep old model L4 6 min Split squat blue band trunk rotation 15x each way; Standing lat bar pull downs 45# 2x10 Staggered stance 10# reach to toe 10x right/left; Squats holding 10# kettlebell hover chair 10x 2 rounds  Dead lifts 25# to tall cone  between feet 10x Plank on mat table opp knee touch 10x each side   Therapeutic activities for standing, walking, lifting and motions needed for golf      5/18: Hip flexor stretch on stairs, dynamic rocking x 10 each side with arm raise overhead and reach over Nustep new model L6 6 min Standing lat bar pull downs 45# 2x10 Kickstand position green band Pallof style: press out, press up, stir the pot each way 5x each Squats holding 10# kettlebell hover above mat 10x 2 rounds  Dead lifts 20# to corner of 6 inch box 10x Facing wall runners hip flexion 10x each side Cable pulley PNF chop 10lb x 12 reps each side Therapeutic activities for standing, walking, lifting and motions needed for golf         PATIENT EDUCATION:  Education details: treatment plan Person educated: Patient Education method: Explanation Education comprehension: verbalized understanding     HOME EXERCISE  PROGRAM: Access Code: [email or web address] URL: [email or web address] Date: 03/04/2022 Prepared by: Ruben Im  Exercises - Seated Sidebending Arms Overhead  - 1 x daily - 7 x weekly - 1 sets - 2 reps - 20 hold -  Quadruped Rocking Slow  - 1 x daily - 7 x weekly - 1 sets - 15 reps - Supine Double Knee to Chest  - 1 x daily - 7 x weekly - 1 sets - 60 reps - Supine Piriformis Stretch with Foot on Ground  - 1 x daily - 7 x weekly - 1 sets - 2 reps - 20 hold - Seated Thoracic Lumbar Extension with Pectoralis Stretch  - 1 x daily - 7 x weekly - 1 sets - 10 reps - doorway hip flexor stretch   - 1 x daily - 7 x weekly - 3 sets - 5 reps - Clamshell with Resistance  - 1 x daily - 7 x weekly - 2 sets - 10 reps - Bird Dog  - 1 x daily - 7 x weekly - 1 sets - 5 reps - 5 hold - Standing Hip Hinge with Dowel  - 1 x daily - 7 x weekly - 1 sets - 10 reps - Marching with Same-Side Arm Raise and Walker  - 1 x daily - 7 x weekly - 1 sets - 10 reps - Plank on Knees  - 1 x daily - 7 x weekly - 1 sets - 5 reps - 5 hold - Half Deadlift with Kettlebell  - 1 x daily - 7 x weekly - 1 sets - 10 reps - Forward Step Down Touch with Heel  - 1 x daily - 7 x weekly - 1 sets - 10 reps - Marching with Same-Side Arm Raise  - 1 x daily - 7 x weekly - 1 sets - 10 reps - Standing Anti-Rotation Press with Anchored Resistance  - 1 x daily - 7 x weekly - 5 sets - 15 reps - Insurance claims handler (Mirrored)  - 1 x daily - 7 x weekly - 1 sets - 10 reps - SNATCH AND PRESS OVERHEAD  - 1 x daily - 7 x weekly - 1 sets - 10 reps - Side Plank on Knees  - 1 x daily - 7 x weekly - 1 sets - 10 reps - Supine Lower Trunk Rotation  - 1 x daily - 7 x weekly - 1 sets - 10 reps - Hooklying Hamstring Stretch with Strap  - 1 x daily - 7 x weekly - 1 sets - 5 reps - Hooklying Isometric Hip Flexion with Opposite Arm  - 1 x daily - 7 x weekly - 1 sets - 10 reps - Hip Flexor Stretch with Chair  - 1 x daily - 7 x weekly - 1 sets - 10 reps -  Shoulder extension with resistance - Neutral  - 1 x daily - 7 x weekly - 1 sets - 10 reps - Single-Leg Anti-Rotation Press With Anchored Resistance  - 1 x daily - 7 x weekly - 1 sets - 10 reps - Sidelying Open Book Thoracic Rotation with Knee on Foam Roll  - 1 x daily - 7 x weekly - 1 sets - 10 reps - Quadruped Thoracic Rotation Full Range with Hand on Neck  - 1 x daily - 7 x weekly - 3 sets - 10 reps - Standing Diagonal Chops with Medicine Ball  - 1 x daily - 7 x weekly - 3 sets - 10 reps - Reverse Chop with Elbows Straight  - 1 x daily - 7 x weekly - 1 sets - 10 reps - Supine Hip Internal and External Rotation  - 1 x daily - 7 x weekly -  2 sets - 10 reps - Cat Cow  - 1 x daily - 7 x weekly - 1 sets - 10 reps ASSESSMENT:   CLINICAL IMPRESSION:  Pt reports complete resolution of pain at this time.  He has met all STGs and most LTGs.  FOTO score improved to 75% exceeding goal of 74%.  Rt hip strength and trunk flexion and extension strength all test at 5/5 MMT.  Trunk ROM is full with slight discomfort on Rt lumbar with bil SB.  SLS on Rt is now 16 sec (was < 3 sec at eval).  Pt continues to need daily stretches and ROM to maintain improved functional task performance and enjoyment of leisure activities such as golf.  Pt is progressing with combined movement patterns and increased challenge to core stabilization.  He is on track to meet all goals within treatment period.    OBJECTIVE IMPAIRMENTS decreased ROM, decreased strength, increased fascial restrictions, and pain.    ACTIVITY LIMITATIONS community activity, meal prep, yard work, and shopping.    PERSONAL FACTORS Past/current experiences are also affecting patient's functional outcome.      REHAB POTENTIAL: Good   CLINICAL DECISION MAKING: Stable/uncomplicated   EVALUATION COMPLEXITY: Low     GOALS: Goals reviewed with patient? Yes   SHORT TERM GOALS: Target date: 03/16/2022   The patient will demonstrate knowledge of basic self  care strategies and exercises to promote healing   Baseline: Goal status: met  2.  The patient will report a 50% improvement in pain levels with functional activities which are currently difficult including lying down and with morning time ADLS Baseline:  Goal status: met   3.  The patient will be report a 50% improvement in sleep Baseline:  Goal status: met     LONG TERM GOALS: Target date: 04/13/2022   The patient will be independent in a safe self progression of a home exercise program to promote further recovery of function   Baseline:  Goal status: ONGOING   2.  The patient will report a 75% improvement in pain levels with functional activities  Baseline:  Goal status: MET   3.  The patient will have improved trunk flexor and extensor muscle strength to at least 4+/5 needed for lifting medium weight objects such as grocery bags, laundry and luggage  Baseline:  Goal status: MET   4.  The patient will have improved right hip strength to at least 4+/5 needed for standing, walking longer distances and descending stairs at home and in the community and return to golf Baseline:  Goal status: MET   5.  FOTO score improved from 67% to 74% Baseline: 67%, 75% ON 03/18/22 Goal status: MET     PLAN: PT FREQUENCY: 2x/week   PT DURATION: 8 weeks   PLANNED INTERVENTIONS: Therapeutic exercises, Therapeutic activity, Neuromuscular re-education, Balance training, Gait training, Patient/Family education, Joint mobilization, Aquatic Therapy, Dry Needling, Electrical stimulation, Spinal mobilization, Moist heat, Traction, Ultrasound, Ionotophoresis 4mg /ml Dexamethasone, and Manual therapy.   PLAN FOR NEXT SESSION:  progressive load strengthening for core/hip; continue DN combined with ES to right low back and glutes;  right hip and lumbar mobs and soft tissue work;  Engineer, technical sales and hip mobility  [employer], PT 03/18/22 10:57 AM   Phone: [phone] Fax: [phone]

## 2022-03-21 DIAGNOSIS — S76312A Strain of muscle, fascia and tendon of the posterior muscle group at thigh level, left thigh, initial encounter: Secondary | ICD-10-CM | POA: Diagnosis not present

## 2022-03-23 ENCOUNTER — Ambulatory Visit: Payer: Medicare PPO | Admitting: Physical Therapy

## 2022-03-23 DIAGNOSIS — M6281 Muscle weakness (generalized): Secondary | ICD-10-CM

## 2022-03-23 DIAGNOSIS — M545 Low back pain, unspecified: Secondary | ICD-10-CM

## 2022-03-23 DIAGNOSIS — R252 Cramp and spasm: Secondary | ICD-10-CM | POA: Diagnosis not present

## 2022-03-23 DIAGNOSIS — M25551 Pain in right hip: Secondary | ICD-10-CM | POA: Diagnosis not present

## 2022-03-23 DIAGNOSIS — G8929 Other chronic pain: Secondary | ICD-10-CM | POA: Diagnosis not present

## 2022-03-23 NOTE — Therapy (Signed)
OUTPATIENT PHYSICAL THERAPY TREATMENT NOTE   Patient Name: Jesse [name] MRN: [id number] DOB:10-Apr-1951, 71 y.o., male Today's Date: 03/23/2022  PCP: Dr. Orland Mustard REFERRING PROVIDER: Dr. Tonita Cong  END OF SESSION:   PT End of Session - 02/18/22 0850     Visit Number 11   Date for PT Re-Evaluation 04/13/22    Authorization Type [employer]; [employer] 16 visits until 6/20   PT Start Time 3007   PT Stop Time 1057   PT Time Calculation (min) 42 min    Activity Tolerance Patient tolerated treatment well             Past Medical History:  Diagnosis Date   Arthritis    knees   Asthma    seasonal   Eczema    GERD (gastroesophageal reflux disease)    HLD (hyperlipidemia)    HTN (hypertension)    Pulmonary emboli ([place])    post op knee scope for a few months   Sleep apnea 12/30/02   TA (temporal arteritis) ([place])    Wears glasses    Past Surgical History:  Procedure Laterality Date   ABCESS DRAINAGE  02/22/2013   i/d-rectal   COLONOSCOPY  06/2015   No polyps, done for family history of colon polyps Next would be 12/30/24   EYE SURGERY Left age 32   lazy eye   INGUINAL HERNIA REPAIR Right 05/25/2013   Procedure: RIGHT INGUINAL HERNIA REPAIR  WITH MESH;  Surgeon: Imogene Burn. Georgette Dover, MD;  Location: [place];  Service: General;  Laterality: Right;   INSERTION OF MESH Right 05/25/2013   Procedure: INSERTION OF MESH;  Surgeon: Imogene Burn. Georgette Dover, MD;  Location: [place];  Service: General;  Laterality: Right;   NASAL SEPTUM SURGERY  10/26/2003   TONSILLECTOMY  71 years old   TOTAL KNEE ARTHROPLASTY Right 10/25/2010   rt total knee   TOTAL KNEE ARTHROPLASTY Left 03/03/2020   Procedure: TOTAL KNEE ARTHROPLASTY;  Surgeon: Gaynelle Arabian, MD;  Location: [facility];  Service: Orthopedics;  Laterality: Left;  29mn   TRACHEAL SURGERY  10/26/1983   emergency trach from throat infection   UMBILICAL HERNIA REPAIR  10/26/1999   WISDOM TOOTH EXTRACTION     Patient Active  Problem List   Diagnosis Date Noted   OA (osteoarthritis) of knee 03/03/2020   Murmur, cardiac 05/08/2014   RBBB 05/08/2014   Right inguinal hernia 03/06/2013   Abscess of right buttock 11/23/2012    REFERRING DIAG: back pain  THERAPY DIAG:  Back pain  PERTINENT HISTORY: right side lipoma; bil TKR  PRECAUTIONS: none  SUBJECTIVE:  On S03/07/2024I was doing dead lifts but not bending my knees and pulled my left hamstring on S2024-03-07  Went to Urgent Care and they said to ice and did ACE bandage compression.  It's a little better today.   PAIN:  Are you having pain? 0 NPRS scale: 0/10 (just stiff)  OBJECTIVE: (objective measures completed at initial evaluation unless otherwise dated) DIAGNOSTIC FINDINGS:  X-ray degenerative disc disease   PATIENT SURVEYS:  03/18/22: FOTO 75% (GOAL WAS 74%, GOAL MET) 02/16/22: FOTO 67%   COGNITION:           Overall cognitive status: Within functional limits for tasks assessed                          SENSATION: WFL   POSTURE:  WFLs   PALPATION: Lateral trunk lipoma; tender over hip greater  trochanter   LUMBAR ROM:    Active  A/PROM  02/16/2022 A/ROM 03/18/22  Flexion 70 FULL fingers to toes no pain  Extension 15 20  Right lateral flexion 20 25 slight Rt sided discomfort  Left lateral flexion 20 25 Rt stretch  Right rotation   Full no pain  Left rotation   Full no pain   (Blank rows = not tested)   LE ROM:  WFLS  LE MMT:   03/18/22: SLS on right 16 sec before LOB 02/16/22: SLS on right < 3 sec   MMT Right 02/16/2022 Left 02/16/2022 Right 03/18/24  Hip flexion 4+ 5 5  Hip extension 4+ 5 5  Hip abduction 4- 5 5  Hip adduction       Hip internal rotation       Hip external rotation       Knee flexion       Knee extension       Ankle dorsiflexion       Ankle plantarflexion       Ankle inversion       Ankle eversion        (Blank rows = not tested)   GAIT: Comments: WNLs   Guarded with rolling, moving on/off treatment  table    TODAY'S TREATMENT  5/30: Supine abdominal isometrics with red ball: straight and diagonals 5 sec holds 10x; Sidelying clams 15x right/left; Prone modified planks 10x 5 sec holds Seated core series with 10#: hip to hip, shoulder to hip, golf swings, ear to ear 10x each Standing lat bar 45# 2x10 Seated cable row 20# 15x Nu-Step L1 seat 10 10 min with left leg not going fully straight      03/18/22 Nustep old model L4 x 6' Hip flexor stretch on stairs, dynamic rocking x 10 each side with arm raise overhead and reach over Seated hamstring stretch 2x20" bil Squat to chair touch hold 10lb kbell 2x10 Dead lifts 25# to tall cone  between feet 10x Standing lat bar pull downs 45# 2x10 Mat table plank opp shoulder taps x 10, slow mountain climber knees x 10 Red physioball on wall elbow plank move ball up/down and Lt/Rt in small pulses x 20 each Supine windshield wipers x 20 Supine piriformis stretch 2x20"    5/23: Hip flexor stretch on stairs, dynamic rocking x 10 each side with arm raise overhead and reach over Nustep old model L4 6 min Split squat blue band trunk rotation 15x each way; Standing lat bar pull downs 45# 2x10 Staggered stance 10# reach to toe 10x right/left; Squats holding 10# kettlebell hover chair 10x 2 rounds  Dead lifts 25# to tall cone  between feet 10x Plank on mat table opp knee touch 10x each side   Therapeutic activities for standing, walking, lifting and motions needed for golf      5/18: Hip flexor stretch on stairs, dynamic rocking x 10 each side with arm raise overhead and reach over Nustep new model L6 6 min Standing lat bar pull downs 45# 2x10 Kickstand position green band Pallof style: press out, press up, stir the pot each way 5x each Squats holding 10# kettlebell hover above mat 10x 2 rounds  Dead lifts 20# to corner of 6 inch box 10x Facing wall runners hip flexion 10x each side Cable pulley PNF chop 10lb x 12 reps each  side Therapeutic activities for standing, walking, lifting and motions needed for golf         PATIENT EDUCATION:  Education details: treatment plan Person educated: Patient Education method: Explanation Education comprehension: verbalized understanding     HOME EXERCISE PROGRAM: Access Code: [email or web address] URL: [email or web address] Date: 03/04/2022 Prepared by: Ruben Im  Exercises - Seated Sidebending Arms Overhead  - 1 x daily - 7 x weekly - 1 sets - 2 reps - 20 hold - Quadruped Rocking Slow  - 1 x daily - 7 x weekly - 1 sets - 15 reps - Supine Double Knee to Chest  - 1 x daily - 7 x weekly - 1 sets - 60 reps - Supine Piriformis Stretch with Foot on Ground  - 1 x daily - 7 x weekly - 1 sets - 2 reps - 20 hold - Seated Thoracic Lumbar Extension with Pectoralis Stretch  - 1 x daily - 7 x weekly - 1 sets - 10 reps - doorway hip flexor stretch   - 1 x daily - 7 x weekly - 3 sets - 5 reps - Clamshell with Resistance  - 1 x daily - 7 x weekly - 2 sets - 10 reps - Bird Dog  - 1 x daily - 7 x weekly - 1 sets - 5 reps - 5 hold - Standing Hip Hinge with Dowel  - 1 x daily - 7 x weekly - 1 sets - 10 reps - Marching with Same-Side Arm Raise and Walker  - 1 x daily - 7 x weekly - 1 sets - 10 reps - Plank on Knees  - 1 x daily - 7 x weekly - 1 sets - 5 reps - 5 hold - Half Deadlift with Kettlebell  - 1 x daily - 7 x weekly - 1 sets - 10 reps - Forward Step Down Touch with Heel  - 1 x daily - 7 x weekly - 1 sets - 10 reps - Marching with Same-Side Arm Raise  - 1 x daily - 7 x weekly - 1 sets - 10 reps - Standing Anti-Rotation Press with Anchored Resistance  - 1 x daily - 7 x weekly - 5 sets - 15 reps - Insurance claims handler (Mirrored)  - 1 x daily - 7 x weekly - 1 sets - 10 reps - SNATCH AND PRESS OVERHEAD  - 1 x daily - 7 x weekly - 1 sets - 10 reps - Side Plank on Knees  - 1 x daily - 7 x weekly - 1 sets - 10 reps - Supine Lower Trunk Rotation  - 1 x daily - 7 x weekly  - 1 sets - 10 reps - Hooklying Hamstring Stretch with Strap  - 1 x daily - 7 x weekly - 1 sets - 5 reps - Hooklying Isometric Hip Flexion with Opposite Arm  - 1 x daily - 7 x weekly - 1 sets - 10 reps - Hip Flexor Stretch with Chair  - 1 x daily - 7 x weekly - 1 sets - 10 reps - Shoulder extension with resistance - Neutral  - 1 x daily - 7 x weekly - 1 sets - 10 reps - Single-Leg Anti-Rotation Press With Anchored Resistance  - 1 x daily - 7 x weekly - 1 sets - 10 reps - Sidelying Open Book Thoracic Rotation with Knee on Foam Roll  - 1 x daily - 7 x weekly - 1 sets - 10 reps - Quadruped Thoracic Rotation Full Range with Hand on Neck  - 1 x daily - 7 x weekly - 3 sets - 10 reps -  Standing Diagonal Chops with Medicine Ball  - 1 x daily - 7 x weekly - 3 sets - 10 reps - Reverse Chop with Elbows Straight  - 1 x daily - 7 x weekly - 1 sets - 10 reps - Supine Hip Internal and External Rotation  - 1 x daily - 7 x weekly - 2 sets - 10 reps - Cat Cow  - 1 x daily - 7 x weekly - 1 sets - 10 reps ASSESSMENT:   CLINICAL IMPRESSION:  The patient had a setback with a pulled hamstring on Saturday.  Treatment focus on core strengthening and dynamic LE movements without full knee extension.  He tolerated these modifications very well.    OBJECTIVE IMPAIRMENTS decreased ROM, decreased strength, increased fascial restrictions, and pain.    ACTIVITY LIMITATIONS community activity, meal prep, yard work, and shopping.    PERSONAL FACTORS Past/current experiences are also affecting patient's functional outcome.      REHAB POTENTIAL: Good   CLINICAL DECISION MAKING: Stable/uncomplicated   EVALUATION COMPLEXITY: Low     GOALS: Goals reviewed with patient? Yes   SHORT TERM GOALS: Target date: 03/16/2022   The patient will demonstrate knowledge of basic self care strategies and exercises to promote healing   Baseline: Goal status: met  2.  The patient will report a 50% improvement in pain levels with  functional activities which are currently difficult including lying down and with morning time ADLS Baseline:  Goal status: met   3.  The patient will be report a 50% improvement in sleep Baseline:  Goal status: met     LONG TERM GOALS: Target date: 04/13/2022   The patient will be independent in a safe self progression of a home exercise program to promote further recovery of function   Baseline:  Goal status: ONGOING   2.  The patient will report a 75% improvement in pain levels with functional activities  Baseline:  Goal status: MET   3.  The patient will have improved trunk flexor and extensor muscle strength to at least 4+/5 needed for lifting medium weight objects such as grocery bags, laundry and luggage  Baseline:  Goal status: MET   4.  The patient will have improved right hip strength to at least 4+/5 needed for standing, walking longer distances and descending stairs at home and in the community and return to golf Baseline:  Goal status: MET   5.  FOTO score improved from 67% to 74% Baseline: 67%, 75% ON 03/18/22 Goal status: MET     PLAN: PT FREQUENCY: 2x/week   PT DURATION: 8 weeks   PLANNED INTERVENTIONS: Therapeutic exercises, Therapeutic activity, Neuromuscular re-education, Balance training, Gait training, Patient/Family education, Joint mobilization, Aquatic Therapy, Dry Needling, Electrical stimulation, Spinal mobilization, Moist heat, Traction, Ultrasound, Ionotophoresis 69m/ml Dexamethasone, and Manual therapy.   PLAN FOR NEXT SESSION:  see how HS is feeling;   progressive load strengthening for core/hip; continue DN combined with ES to right low back and glutes;  right hip and lumbar mobs and soft tissue work;  sEngineer, technical salesand hip mobility   SRuben Im PT 03/23/22 5:30 PM Phone: [phone]: [phone]    Phone: [phone]: [phone]

## 2022-03-25 ENCOUNTER — Ambulatory Visit: Payer: Medicare PPO | Attending: Specialist | Admitting: Physical Therapy

## 2022-03-25 DIAGNOSIS — M545 Low back pain, unspecified: Secondary | ICD-10-CM | POA: Insufficient documentation

## 2022-03-25 DIAGNOSIS — G8929 Other chronic pain: Secondary | ICD-10-CM | POA: Insufficient documentation

## 2022-03-25 DIAGNOSIS — M25551 Pain in right hip: Secondary | ICD-10-CM | POA: Insufficient documentation

## 2022-03-25 DIAGNOSIS — M6281 Muscle weakness (generalized): Secondary | ICD-10-CM | POA: Insufficient documentation

## 2022-03-25 DIAGNOSIS — R252 Cramp and spasm: Secondary | ICD-10-CM | POA: Insufficient documentation

## 2022-03-25 NOTE — Therapy (Signed)
OUTPATIENT PHYSICAL THERAPY TREATMENT NOTE   Patient Name: Jesse [name] MRN: [id number] DOB:1950/11/27, 71 y.o., male Today's Date: 03/25/2022  PCP: Dr. Orland Mustard REFERRING PROVIDER: Dr. Tonita Cong  END OF SESSION:   PT End of Session - 02/18/22 0850     Visit Number 12   Date for PT Re-Evaluation 04/13/22    Authorization Type BCBS; [employer] 16 visits until 6/20   PT Start Time 1017   PT Stop Time 1057   PT Time Calculation (min) 40 min    Activity Tolerance Patient tolerated treatment well             Past Medical History:  Diagnosis Date   Arthritis    knees   Asthma    seasonal   Eczema    GERD (gastroesophageal reflux disease)    HLD (hyperlipidemia)    HTN (hypertension)    Pulmonary emboli ([place])    post op knee scope for a few months   Sleep apnea 2004   TA (temporal arteritis) ([place])    Wears glasses    Past Surgical History:  Procedure Laterality Date   ABCESS DRAINAGE  02/22/2013   i/d-rectal   COLONOSCOPY  06/2015   No polyps, done for family history of colon polyps Next would be 2026   EYE SURGERY Left age 1   lazy eye   INGUINAL HERNIA REPAIR Right 05/25/2013   Procedure: RIGHT INGUINAL HERNIA REPAIR  WITH MESH;  Surgeon: Imogene Burn. Georgette Dover, MD;  Location: [place];  Service: General;  Laterality: Right;   INSERTION OF MESH Right 05/25/2013   Procedure: INSERTION OF MESH;  Surgeon: Imogene Burn. Georgette Dover, MD;  Location: [place];  Service: General;  Laterality: Right;   NASAL SEPTUM SURGERY  10/26/2003   TONSILLECTOMY  71 years old   TOTAL KNEE ARTHROPLASTY Right 10/25/2010   rt total knee   TOTAL KNEE ARTHROPLASTY Left 03/03/2020   Procedure: TOTAL KNEE ARTHROPLASTY;  Surgeon: Gaynelle Arabian, MD;  Location: [facility];  Service: Orthopedics;  Laterality: Left;  80mn   TRACHEAL SURGERY  10/26/1983   emergency trach from throat infection   UMBILICAL HERNIA REPAIR  10/26/1999   WISDOM TOOTH EXTRACTION     Patient Active  Problem List   Diagnosis Date Noted   OA (osteoarthritis) of knee 03/03/2020   Murmur, cardiac 05/08/2014   RBBB 05/08/2014   Right inguinal hernia 03/06/2013   Abscess of right buttock 11/23/2012    REFERRING DIAG: back pain  THERAPY DIAG:  Back pain  PERTINENT HISTORY: right side lipoma; bil TKR  PRECAUTIONS: none  SUBJECTIVE:  HS feeling better with new compression wrap.  Dealing with a bout of vertigo when I turn to the left.  Back is doing OK.    PAIN:  Are you having pain? 0 NPRS scale: 0/10 (just stiff)  OBJECTIVE: (objective measures completed at initial evaluation unless otherwise dated) DIAGNOSTIC FINDINGS:  X-ray degenerative disc disease   PATIENT SURVEYS:  03/18/22: FOTO 75% (GOAL WAS 74%, GOAL MET) 02/16/22: FOTO 67%   COGNITION:           Overall cognitive status: Within functional limits for tasks assessed                          SENSATION: WFL   POSTURE:  WFLs   PALPATION: Lateral trunk lipoma; tender over hip greater trochanter   LUMBAR ROM:    Active  A/PROM  02/16/2022 A/ROM  03/18/22  Flexion 70 FULL fingers to toes no pain  Extension 15 20  Right lateral flexion 20 25 slight Rt sided discomfort  Left lateral flexion 20 25 Rt stretch  Right rotation   Full no pain  Left rotation   Full no pain   (Blank rows = not tested)   LE ROM:  WFLS  LE MMT:   03/18/22: SLS on right 16 sec before LOB 02/16/22: SLS on right < 3 sec   MMT Right 02/16/2022 Left 02/16/2022 Right 03/18/24  Hip flexion 4+ 5 5  Hip extension 4+ 5 5  Hip abduction 4- 5 5  Hip adduction       Hip internal rotation       Hip external rotation       Knee flexion       Knee extension       Ankle dorsiflexion       Ankle plantarflexion       Ankle inversion       Ankle eversion        (Blank rows = not tested)   GAIT: Comments: WNLs   Guarded with rolling, moving on/off treatment table    TODAY'S TREATMENT  6/1: Seated trunk rotation cable pulley 10# 2x 10  each way Hip machine 40# hip abduction and hip flexion 10x each Standing lat bar 45# 2x10 Seated Matrix cable row 35# 10x2 Nu-Step L2  seat 10 10 min  Partial squats no weight 10x, 10# partial squats 10x      5/30: Supine abdominal isometrics with red ball: straight and diagonals 5 sec holds 10x; Sidelying clams 15x right/left; Prone modified planks 10x 5 sec holds Seated core series with 10#: hip to hip, shoulder to hip, golf swings, ear to ear 10x each Standing lat bar 45# 2x10 Seated cable row 20# 15x Nu-Step L1 seat 10 10 min with left leg not going fully straight      03/18/22 Nustep old model L4 x 6' Hip flexor stretch on stairs, dynamic rocking x 10 each side with arm raise overhead and reach over Seated hamstring stretch 2x20" bil Squat to chair touch hold 10lb kbell 2x10 Dead lifts 25# to tall cone  between feet 10x Standing lat bar pull downs 45# 2x10 Mat table plank opp shoulder taps x 10, slow mountain climber knees x 10 Red physioball on wall elbow plank move ball up/down and Lt/Rt in small pulses x 20 each Supine windshield wipers x 20 Supine piriformis stretch 2x20"    5/23: Hip flexor stretch on stairs, dynamic rocking x 10 each side with arm raise overhead and reach over Nustep old model L4 6 min Split squat blue band trunk rotation 15x each way; Standing lat bar pull downs 45# 2x10 Staggered stance 10# reach to toe 10x right/left; Squats holding 10# kettlebell hover chair 10x 2 rounds  Dead lifts 25# to tall cone  between feet 10x Plank on mat table opp knee touch 10x each side   Therapeutic activities for standing, walking, lifting and motions needed for golf      5/18: Hip flexor stretch on stairs, dynamic rocking x 10 each side with arm raise overhead and reach over Nustep new model L6 6 min Standing lat bar pull downs 45# 2x10 Kickstand position green band [place] style: press out, press up, stir the pot each way 5x each Squats holding 10#  kettlebell hover above mat 10x 2 rounds  Dead lifts 20# to corner of 6 inch box  10x Facing wall runners hip flexion 10x each side Cable pulley PNF chop 10lb x 12 reps each side Therapeutic activities for standing, walking, lifting and motions needed for golf         PATIENT EDUCATION:  Education details: treatment plan Person educated: Patient Education method: Explanation Education comprehension: verbalized understanding     HOME EXERCISE PROGRAM: Access Code: [email or web address] URL: [email or web address] Date: 03/04/2022 Prepared by: Ruben Im  Exercises - Seated Sidebending Arms Overhead  - 1 x daily - 7 x weekly - 1 sets - 2 reps - 20 hold - Quadruped Rocking Slow  - 1 x daily - 7 x weekly - 1 sets - 15 reps - Supine Double Knee to Chest  - 1 x daily - 7 x weekly - 1 sets - 60 reps - Supine Piriformis Stretch with Foot on Ground  - 1 x daily - 7 x weekly - 1 sets - 2 reps - 20 hold - Seated Thoracic Lumbar Extension with Pectoralis Stretch  - 1 x daily - 7 x weekly - 1 sets - 10 reps - doorway hip flexor stretch   - 1 x daily - 7 x weekly - 3 sets - 5 reps - Clamshell with Resistance  - 1 x daily - 7 x weekly - 2 sets - 10 reps - Bird Dog  - 1 x daily - 7 x weekly - 1 sets - 5 reps - 5 hold - Standing Hip Hinge with Dowel  - 1 x daily - 7 x weekly - 1 sets - 10 reps - Marching with Same-Side Arm Raise and Walker  - 1 x daily - 7 x weekly - 1 sets - 10 reps - Plank on Knees  - 1 x daily - 7 x weekly - 1 sets - 5 reps - 5 hold - Half Deadlift with Kettlebell  - 1 x daily - 7 x weekly - 1 sets - 10 reps - Forward Step Down Touch with Heel  - 1 x daily - 7 x weekly - 1 sets - 10 reps - Marching with Same-Side Arm Raise  - 1 x daily - 7 x weekly - 1 sets - 10 reps - Standing Anti-Rotation Press with Anchored Resistance  - 1 x daily - 7 x weekly - 5 sets - 15 reps - Insurance claims handler (Mirrored)  - 1 x daily - 7 x weekly - 1 sets - 10 reps - SNATCH AND PRESS  OVERHEAD  - 1 x daily - 7 x weekly - 1 sets - 10 reps - Side Plank on Knees  - 1 x daily - 7 x weekly - 1 sets - 10 reps - Supine Lower Trunk Rotation  - 1 x daily - 7 x weekly - 1 sets - 10 reps - Hooklying Hamstring Stretch with Strap  - 1 x daily - 7 x weekly - 1 sets - 5 reps - Hooklying Isometric Hip Flexion with Opposite Arm  - 1 x daily - 7 x weekly - 1 sets - 10 reps - Hip Flexor Stretch with Chair  - 1 x daily - 7 x weekly - 1 sets - 10 reps - Shoulder extension with resistance - Neutral  - 1 x daily - 7 x weekly - 1 sets - 10 reps - Single-Leg Anti-Rotation Press With Anchored Resistance  - 1 x daily - 7 x weekly - 1 sets - 10 reps - Sidelying Open Book Thoracic Rotation with Knee on Foam Roll  -  1 x daily - 7 x weekly - 1 sets - 10 reps - Quadruped Thoracic Rotation Full Range with Hand on Neck  - 1 x daily - 7 x weekly - 3 sets - 10 reps - Standing Diagonal Chops with Medicine Ball  - 1 x daily - 7 x weekly - 3 sets - 10 reps - Reverse Chop with Elbows Straight  - 1 x daily - 7 x weekly - 1 sets - 10 reps - Supine Hip Internal and External Rotation  - 1 x daily - 7 x weekly - 2 sets - 10 reps - Cat Cow  - 1 x daily - 7 x weekly - 1 sets - 10 reps ASSESSMENT:   CLINICAL IMPRESSION:  Treatment modified to accommodate for new onset of vertigo as well as healing hamstring strain last Saturday.  Able to do moderate intensity lumbo pelvic and hip core strengthening in seated and standing positions.  Recommend 3-4 more visits to ensure he will be successful in self management.    OBJECTIVE IMPAIRMENTS decreased ROM, decreased strength, increased fascial restrictions, and pain.    ACTIVITY LIMITATIONS community activity, meal prep, yard work, and shopping.    PERSONAL FACTORS Past/current experiences are also affecting patient's functional outcome.      REHAB POTENTIAL: Good   CLINICAL DECISION MAKING: Stable/uncomplicated   EVALUATION COMPLEXITY: Low     GOALS: Goals reviewed  with patient? Yes   SHORT TERM GOALS: Target date: 03/16/2022   The patient will demonstrate knowledge of basic self care strategies and exercises to promote healing   Baseline: Goal status: met  2.  The patient will report a 50% improvement in pain levels with functional activities which are currently difficult including lying down and with morning time ADLS Baseline:  Goal status: met   3.  The patient will be report a 50% improvement in sleep Baseline:  Goal status: met     LONG TERM GOALS: Target date: 04/13/2022   The patient will be independent in a safe self progression of a home exercise program to promote further recovery of function   Baseline:  Goal status: ONGOING   2.  The patient will report a 75% improvement in pain levels with functional activities  Baseline:  Goal status: MET   3.  The patient will have improved trunk flexor and extensor muscle strength to at least 4+/5 needed for lifting medium weight objects such as grocery bags, laundry and luggage  Baseline:  Goal status: MET   4.  The patient will have improved right hip strength to at least 4+/5 needed for standing, walking longer distances and descending stairs at home and in the community and return to golf Baseline:  Goal status: MET   5.  FOTO score improved from 67% to 74% Baseline: 67%, 75% ON 03/18/22 Goal status: MET     PLAN: PT FREQUENCY: 2x/week   PT DURATION: 8 weeks   PLANNED INTERVENTIONS: Therapeutic exercises, Therapeutic activity, Neuromuscular re-education, Balance training, Gait training, Patient/Family education, Joint mobilization, Aquatic Therapy, Dry Needling, Electrical stimulation, Spinal mobilization, Moist heat, Traction, Ultrasound, Ionotophoresis 31m/ml Dexamethasone, and Manual therapy.   PLAN FOR NEXT SESSION:  see how HS is feeling;  awareness of vertigo;  progressive load strengthening for core/hip; continue DN combined with ES to right low back and glutes;  right hip  and lumbar mobs and soft tissue work;  sEngineer, technical salesand hip mobility;  3-4 more visits  SRuben Im PT 03/25/22 3:18 PM Phone: [phone]  Fax: [phone]

## 2022-03-30 ENCOUNTER — Encounter: Payer: Self-pay | Admitting: Physical Therapy

## 2022-03-30 ENCOUNTER — Ambulatory Visit: Payer: Medicare PPO | Admitting: Physical Therapy

## 2022-03-30 DIAGNOSIS — M545 Low back pain, unspecified: Secondary | ICD-10-CM

## 2022-03-30 DIAGNOSIS — M6281 Muscle weakness (generalized): Secondary | ICD-10-CM

## 2022-03-30 DIAGNOSIS — R252 Cramp and spasm: Secondary | ICD-10-CM | POA: Diagnosis not present

## 2022-03-30 DIAGNOSIS — M25551 Pain in right hip: Secondary | ICD-10-CM | POA: Diagnosis not present

## 2022-03-30 DIAGNOSIS — G8929 Other chronic pain: Secondary | ICD-10-CM | POA: Diagnosis not present

## 2022-03-30 NOTE — Therapy (Signed)
OUTPATIENT PHYSICAL THERAPY TREATMENT NOTE   Patient Name: Jesse [name] MRN: [id number] DOB:1951/07/16, 71 y.o., male Today's Date: 03/30/2022  PCP: Dr. Orland Mustard REFERRING PROVIDER: Dr. Tonita Cong  END OF SESSION:   PT End of Session - 02/18/22 0850     Visit Number 13   Date for PT Re-Evaluation 04/13/22    Authorization Type BCBS; [employer] 16 visits until 6/20   PT Start Time 1101   PT Stop Time 1139   PT Time Calculation (min) 38 min    Activity Tolerance Patient tolerated treatment well             Past Medical History:  Diagnosis Date   Arthritis    knees   Asthma    seasonal   Eczema    GERD (gastroesophageal reflux disease)    HLD (hyperlipidemia)    HTN (hypertension)    Pulmonary emboli ([place])    post op knee scope for a few months   Sleep apnea 2004   TA (temporal arteritis) ([place])    Wears glasses    Past Surgical History:  Procedure Laterality Date   ABCESS DRAINAGE  02/22/2013   i/d-rectal   COLONOSCOPY  06/2015   No polyps, done for family history of colon polyps Next would be 2026   EYE SURGERY Left age 50   lazy eye   INGUINAL HERNIA REPAIR Right 05/25/2013   Procedure: RIGHT INGUINAL HERNIA REPAIR  WITH MESH;  Surgeon: Imogene Burn. Georgette Dover, MD;  Location: [place];  Service: General;  Laterality: Right;   INSERTION OF MESH Right 05/25/2013   Procedure: INSERTION OF MESH;  Surgeon: Imogene Burn. Georgette Dover, MD;  Location: [place];  Service: General;  Laterality: Right;   NASAL SEPTUM SURGERY  10/26/2003   TONSILLECTOMY  71 years old   TOTAL KNEE ARTHROPLASTY Right 10/25/2010   rt total knee   TOTAL KNEE ARTHROPLASTY Left 03/03/2020   Procedure: TOTAL KNEE ARTHROPLASTY;  Surgeon: Gaynelle Arabian, MD;  Location: [facility];  Service: Orthopedics;  Laterality: Left;  15mn   TRACHEAL SURGERY  10/26/1983   emergency trach from throat infection   UMBILICAL HERNIA REPAIR  10/26/1999   WISDOM TOOTH EXTRACTION     Patient Active  Problem List   Diagnosis Date Noted   OA (osteoarthritis) of knee 03/03/2020   Murmur, cardiac 05/08/2014   RBBB 05/08/2014   Right inguinal hernia 03/06/2013   Abscess of right buttock 11/23/2012    REFERRING DIAG: back pain  THERAPY DIAG:  Back pain  PERTINENT HISTORY: right side lipoma; bil TKR  PRECAUTIONS: none  SUBJECTIVE:  HS is about 75% improved.  I went on a hilly walk and did well.  I am gently stretching it.  My back is doing great.  PAIN:  Are you having pain? 0 NPRS scale: 0/10 (just stiff)  OBJECTIVE: (objective measures completed at initial evaluation unless otherwise dated) DIAGNOSTIC FINDINGS:  X-ray degenerative disc disease   PATIENT SURVEYS:  03/18/22: FOTO 75% (GOAL WAS 74%, GOAL MET) 02/16/22: FOTO 67%   COGNITION:           Overall cognitive status: Within functional limits for tasks assessed                          SENSATION: WFL   POSTURE:  WFLs   PALPATION: Lateral trunk lipoma; tender over hip greater trochanter   LUMBAR ROM:    Active  A/PROM  02/16/2022 A/ROM  03/18/22  Flexion 70 FULL fingers to toes no pain  Extension 15 20  Right lateral flexion 20 25 slight Rt sided discomfort  Left lateral flexion 20 25 Rt stretch  Right rotation   Full no pain  Left rotation   Full no pain   (Blank rows = not tested)   LE ROM:  WFLS  LE MMT:   03/18/22: SLS on right 16 sec before LOB 02/16/22: SLS on right < 3 sec   MMT Right 02/16/2022 Left 02/16/2022 Right 03/18/24  Hip flexion 4+ 5 5  Hip extension 4+ 5 5  Hip abduction 4- 5 5  Hip adduction       Hip internal rotation       Hip external rotation       Knee flexion       Knee extension       Ankle dorsiflexion       Ankle plantarflexion       Ankle inversion       Ankle eversion        (Blank rows = not tested)   GAIT: Comments: WNLs   Guarded with rolling, moving on/off treatment table    TODAY'S TREATMENT  03/30/22: NuStep L6 seat 11 new model x 6' PT present to  discuss status Seated on dynadisc marching alt LE x 30 Seated on dynadisc 3lb diag UE lift x 10 each UE Plank forearms on red physioball on wall move ball up/down and Lt/Rt 5x5 reps each Hip machine 40# hip abduction and hip flexion 2x10 each Seated Matrix cable row 35# 2x10 Standing lat bar 45# 2x10 Partial squats no weight 10x, 10# partial squats 10x  Diagonal cable chops 1x10 bil 10lb with trunk rotation SL open book knee on foam roller x 10 each side Windshield wipers x 10, then lower trunk rotation with active reach into stretch 5x5"   6/1: Seated trunk rotation cable pulley 10# 2x 10 each way Hip machine 40# hip abduction and hip flexion 10x each Standing lat bar 45# 2x10 Seated Matrix cable row 35# 10x2 Nu-Step L2  seat 10 10 min  Partial squats no weight 10x, 10# partial squats 10x    5/30: Supine abdominal isometrics with red ball: straight and diagonals 5 sec holds 10x; Sidelying clams 15x right/left; Prone modified planks 10x 5 sec holds Seated core series with 10#: hip to hip, shoulder to hip, golf swings, ear to ear 10x each Standing lat bar 45# 2x10 Seated cable row 20# 15x Nu-Step L1 seat 10 10 min with left leg not going fully straight    PATIENT EDUCATION:  Education details: treatment plan Person educated: Patient Education method: Explanation Education comprehension: verbalized understanding     HOME EXERCISE PROGRAM: Access Code: [email or web address] URL: [email or web address] Date: 03/04/2022 Prepared by: Ruben Im  Exercises - Seated Sidebending Arms Overhead  - 1 x daily - 7 x weekly - 1 sets - 2 reps - 20 hold - Quadruped Rocking Slow  - 1 x daily - 7 x weekly - 1 sets - 15 reps - Supine Double Knee to Chest  - 1 x daily - 7 x weekly - 1 sets - 60 reps - Supine Piriformis Stretch with Foot on Ground  - 1 x daily - 7 x weekly - 1 sets - 2 reps - 20 hold - Seated Thoracic Lumbar Extension with Pectoralis Stretch  - 1 x daily - 7 x weekly -  1 sets - 10 reps - doorway hip flexor  stretch   - 1 x daily - 7 x weekly - 3 sets - 5 reps - Clamshell with Resistance  - 1 x daily - 7 x weekly - 2 sets - 10 reps - Bird Dog  - 1 x daily - 7 x weekly - 1 sets - 5 reps - 5 hold - Standing Hip Hinge with Dowel  - 1 x daily - 7 x weekly - 1 sets - 10 reps - Marching with Same-Side Arm Raise and Walker  - 1 x daily - 7 x weekly - 1 sets - 10 reps - Plank on Knees  - 1 x daily - 7 x weekly - 1 sets - 5 reps - 5 hold - Half Deadlift with Kettlebell  - 1 x daily - 7 x weekly - 1 sets - 10 reps - Forward Step Down Touch with Heel  - 1 x daily - 7 x weekly - 1 sets - 10 reps - Marching with Same-Side Arm Raise  - 1 x daily - 7 x weekly - 1 sets - 10 reps - Standing Anti-Rotation Press with Anchored Resistance  - 1 x daily - 7 x weekly - 5 sets - 15 reps - Insurance claims handler (Mirrored)  - 1 x daily - 7 x weekly - 1 sets - 10 reps - SNATCH AND PRESS OVERHEAD  - 1 x daily - 7 x weekly - 1 sets - 10 reps - Side Plank on Knees  - 1 x daily - 7 x weekly - 1 sets - 10 reps - Supine Lower Trunk Rotation  - 1 x daily - 7 x weekly - 1 sets - 10 reps - Hooklying Hamstring Stretch with Strap  - 1 x daily - 7 x weekly - 1 sets - 5 reps - Hooklying Isometric Hip Flexion with Opposite Arm  - 1 x daily - 7 x weekly - 1 sets - 10 reps - Hip Flexor Stretch with Chair  - 1 x daily - 7 x weekly - 1 sets - 10 reps - Shoulder extension with resistance - Neutral  - 1 x daily - 7 x weekly - 1 sets - 10 reps - Single-Leg Anti-Rotation Press With Anchored Resistance  - 1 x daily - 7 x weekly - 1 sets - 10 reps - Sidelying Open Book Thoracic Rotation with Knee on Foam Roll  - 1 x daily - 7 x weekly - 1 sets - 10 reps - Quadruped Thoracic Rotation Full Range with Hand on Neck  - 1 x daily - 7 x weekly - 3 sets - 10 reps - Standing Diagonal Chops with Medicine Ball  - 1 x daily - 7 x weekly - 3 sets - 10 reps - Reverse Chop with Elbows Straight  - 1 x daily - 7 x weekly -  1 sets - 10 reps - Supine Hip Internal and External Rotation  - 1 x daily - 7 x weekly - 2 sets - 10 reps - Cat Cow  - 1 x daily - 7 x weekly - 1 sets - 10 reps ASSESSMENT:   CLINICAL IMPRESSION:  Pt reports 75% improvement in Lt hamstring strain.  He was able to fully participate in session today and added 2nd set on hip matrix for hip flexion and abd without difficulty.  Pt is traveling next week and will return on 6/20 for ERO.  Anticipate d/c next time.  OBJECTIVE IMPAIRMENTS decreased ROM, decreased strength, increased fascial restrictions, and pain.  ACTIVITY LIMITATIONS community activity, meal prep, yard work, and shopping.    PERSONAL FACTORS Past/current experiences are also affecting patient's functional outcome.      REHAB POTENTIAL: Good   CLINICAL DECISION MAKING: Stable/uncomplicated   EVALUATION COMPLEXITY: Low     GOALS: Goals reviewed with patient? Yes   SHORT TERM GOALS: Target date: 03/16/2022   The patient will demonstrate knowledge of basic self care strategies and exercises to promote healing   Baseline: Goal status: met  2.  The patient will report a 50% improvement in pain levels with functional activities which are currently difficult including lying down and with morning time ADLS Baseline:  Goal status: met   3.  The patient will be report a 50% improvement in sleep Baseline:  Goal status: met     LONG TERM GOALS: Target date: 04/13/2022   The patient will be independent in a safe self progression of a home exercise program to promote further recovery of function   Baseline:  Goal status: ONGOING   2.  The patient will report a 75% improvement in pain levels with functional activities  Baseline:  Goal status: MET   3.  The patient will have improved trunk flexor and extensor muscle strength to at least 4+/5 needed for lifting medium weight objects such as grocery bags, laundry and luggage  Baseline:  Goal status: MET   4.  The patient  will have improved right hip strength to at least 4+/5 needed for standing, walking longer distances and descending stairs at home and in the community and return to golf Baseline:  Goal status: MET   5.  FOTO score improved from 67% to 74% Baseline: 67%, 75% ON 03/18/22 Goal status: MET     PLAN: PT FREQUENCY: 2x/week   PT DURATION: 8 weeks   PLANNED INTERVENTIONS: Therapeutic exercises, Therapeutic activity, Neuromuscular re-education, Balance training, Gait training, Patient/Family education, Joint mobilization, Aquatic Therapy, Dry Needling, Electrical stimulation, Spinal mobilization, Moist heat, Traction, Ultrasound, Ionotophoresis 59m/ml Dexamethasone, and Manual therapy.   PLAN FOR NEXT SESSION:  ERO next time, anticipate d/c  Johanna Beuhring, PT 03/30/22 11:41 AM

## 2022-04-02 ENCOUNTER — Ambulatory Visit (INDEPENDENT_AMBULATORY_CARE_PROVIDER_SITE_OTHER): Payer: Medicare PPO

## 2022-04-02 DIAGNOSIS — B351 Tinea unguium: Secondary | ICD-10-CM

## 2022-04-02 NOTE — Progress Notes (Signed)
Patient presents today for the 5th laser treatment. Diagnosed with mycotic nail infection by Dr. Cannon Kettle.   Toenail most affected bilateral hallux. The hallux nails are looking better  All other systems are negative.  Nails were filed thin. Laser therapy was administered to bilateral 1-5 toenails  and patient tolerated the treatment well. All safety precautions were in place.    Follow up in 8 weeks for laser # 6.

## 2022-04-13 ENCOUNTER — Ambulatory Visit: Payer: Medicare PPO | Admitting: Physical Therapy

## 2022-04-13 ENCOUNTER — Encounter: Payer: Self-pay | Admitting: Physical Therapy

## 2022-04-13 DIAGNOSIS — G8929 Other chronic pain: Secondary | ICD-10-CM | POA: Diagnosis not present

## 2022-04-13 DIAGNOSIS — R252 Cramp and spasm: Secondary | ICD-10-CM | POA: Diagnosis not present

## 2022-04-13 DIAGNOSIS — M25551 Pain in right hip: Secondary | ICD-10-CM | POA: Diagnosis not present

## 2022-04-13 DIAGNOSIS — M6281 Muscle weakness (generalized): Secondary | ICD-10-CM

## 2022-04-13 DIAGNOSIS — M545 Low back pain, unspecified: Secondary | ICD-10-CM | POA: Diagnosis not present

## 2022-04-13 NOTE — Therapy (Signed)
OUTPATIENT PHYSICAL THERAPY TREATMENT NOTE   Patient Name: Jesse [name] MRN: [id number] DOB:08-Sep-1951, 71 y.o., male Today's Date: 04/13/2022  PCP: Dr. Orland Mustard REFERRING PROVIDER: Dr. Tonita Cong  END OF SESSION:   PT End of Session - 02/18/22 0850     Visit Number 14   Date for PT Re-Evaluation 04/13/22    Authorization Type [employer]; [employer] 16 visits until 6/20   PT Start Time 930   PT Stop Time 1012   PT Time Calculation (min) 42 min    Activity Tolerance Patient tolerated treatment well             Past Medical History:  Diagnosis Date   Arthritis    knees   Asthma    seasonal   Eczema    GERD (gastroesophageal reflux disease)    HLD (hyperlipidemia)    HTN (hypertension)    Pulmonary emboli ([place])    post op knee scope for a few months   Sleep apnea 2004   TA (temporal arteritis) ([place])    Wears glasses    Past Surgical History:  Procedure Laterality Date   ABCESS DRAINAGE  02/22/2013   i/d-rectal   COLONOSCOPY  06/2015   No polyps, done for family history of colon polyps Next would be 2026   EYE SURGERY Left age 6   lazy eye   INGUINAL HERNIA REPAIR Right 05/25/2013   Procedure: RIGHT INGUINAL HERNIA REPAIR  WITH MESH;  Surgeon: Imogene Burn. Georgette Dover, MD;  Location: [facility];  Service: General;  Laterality: Right;   INSERTION OF MESH Right 05/25/2013   Procedure: INSERTION OF MESH;  Surgeon: Imogene Burn. Georgette Dover, MD;  Location: [facility];  Service: General;  Laterality: Right;   NASAL SEPTUM SURGERY  10/26/2003   TONSILLECTOMY  71 years old   TOTAL KNEE ARTHROPLASTY Right 10/25/2010   rt total knee   TOTAL KNEE ARTHROPLASTY Left 03/03/2020   Procedure: TOTAL KNEE ARTHROPLASTY;  Surgeon: Gaynelle Arabian, MD;  Location: [facility];  Service: Orthopedics;  Laterality: Left;  39min   TRACHEAL SURGERY  10/26/1983   emergency trach from throat infection   UMBILICAL HERNIA REPAIR  10/26/1999   WISDOM TOOTH EXTRACTION     Patient Active  Problem List   Diagnosis Date Noted   OA (osteoarthritis) of knee 03/03/2020   Murmur, cardiac 05/08/2014   RBBB 05/08/2014   Right inguinal hernia 03/06/2013   Abscess of right buttock 11/23/2012    REFERRING DIAG: back pain  THERAPY DIAG:  Back pain  PERTINENT HISTORY: right side lipoma; bil TKR  PRECAUTIONS: none  SUBJECTIVE:  My back pain is 100% better.  Did great traveling and being with young grandkids.  My hamstring is still tweaked - I thought it was better but then I was gardening and stepping in a small hole and it set me back a few days.  PAIN:  Are you having pain? 0 NPRS scale: 0/10 (just stiff)  OBJECTIVE: (objective measures completed at initial evaluation unless otherwise dated) DIAGNOSTIC FINDINGS:  X-ray degenerative disc disease   PATIENT SURVEYS:  03/18/22: FOTO 75% (GOAL WAS 74%, GOAL MET) 02/16/22: FOTO 67%   COGNITION:           Overall cognitive status: Within functional limits for tasks assessed                          SENSATION: WFL   POSTURE:  WFLs   PALPATION: Lateral trunk  lipoma; tender over hip greater trochanter   LUMBAR ROM:    Active  A/PROM  02/16/2022 A/ROM 03/18/22 A/ROM 04/13/22  Flexion 70 FULL fingers to toes no pain Full no pain  Extension $RemoveBe'15 20 20 'FwPjUZhUM$ no pain  Right lateral flexion 20 25 slight Rt sided discomfort 25  Left lateral flexion 20 25 Rt stretch 25  Right rotation   Full no pain Full no pain  Left rotation   Full no pain Full no pain   (Blank rows = not tested)   LE ROM:  WFLS  LE MMT:   03/18/22: SLS on right 16 sec before LOB 02/16/22: SLS on right < 3 sec   MMT Right 02/16/2022 Left 02/16/2022 Right 03/18/24  Hip flexion 4+ 5 5  Hip extension 4+ 5 5  Hip abduction 4- 5 5  Hip adduction       Hip internal rotation       Hip external rotation       Knee flexion       Knee extension       Ankle dorsiflexion       Ankle plantarflexion       Ankle inversion       Ankle eversion        (Blank rows =  not tested)   GAIT: Comments: WNLs   Guarded with rolling, moving on/off treatment table    TODAY'S TREATMENT  04/13/22: NuStep L5 x 4', L6 x 4',new model seat 10 arms 10, PT present to finalize goals and discuss progress Windshield wipers x 10, then lower trunk rotation with active reach into stretch 5x5" Supine hamstring stretch bil 2x30" SL open book knee on foam roller x 10 each side Standing trunk A/ROM x 5 each: trunk SB, trunk rotation, trunk rotation diagonals holding golf club Standing deadlifts within hamstring range 10lb x 15 reps Standing squat hold 10lb x 15 Standing blue tband diagonal lifts with bil UE to simulate golf swing x 15 each way Hip machine 40# hip abduction and hip flexion 2x10 each  03/30/22: NuStep L6 seat 11 new model x 6' PT present to discuss status Seated on dynadisc marching alt LE x 30 Seated on dynadisc 3lb diag UE lift x 10 each UE Plank forearms on red physioball on wall move ball up/down and Lt/Rt 5x5 reps each Hip machine 40# hip abduction and hip flexion 2x10 each Seated Matrix cable row 35# 2x10 Standing lat bar 45# 2x10 Partial squats no weight 10x, 10# partial squats 10x  Diagonal cable chops 1x10 bil 10lb with trunk rotation SL open book knee on foam roller x 10 each side Windshield wipers x 10, then lower trunk rotation with active reach into stretch 5x5"   6/1: Seated trunk rotation cable pulley 10# 2x 10 each way Hip machine 40# hip abduction and hip flexion 10x each Standing lat bar 45# 2x10 Seated Matrix cable row 35# 10x2 Nu-Step L2  seat 10 10 min  Partial squats no weight 10x, 10# partial squats 10x    5/30: Supine abdominal isometrics with red ball: straight and diagonals 5 sec holds 10x; Sidelying clams 15x right/left; Prone modified planks 10x 5 sec holds Seated core series with 10#: hip to hip, shoulder to hip, golf swings, ear to ear 10x each Standing lat bar 45# 2x10 Seated cable row 20# 15x Nu-Step L1 seat 10 10  min with left leg not going fully straight    PATIENT EDUCATION:  Education details: treatment plan Person educated:  Patient Education method: Explanation Education comprehension: verbalized understanding     HOME EXERCISE PROGRAM: Access Code: [email or web address] URL: [email or web address] Date: 03/04/2022 Prepared by: [place]  - 1 x daily - 7 x weekly - 1 sets - 2 reps - 20 hold - Quadruped Rocking Slow  - 1 x daily - 7 x weekly - 1 sets - 15 reps - Supine Double Knee to Chest  - 1 x daily - 7 x weekly - 1 sets - 60 reps - Supine Piriformis Stretch with Foot on Ground  - 1 x daily - 7 x weekly - 1 sets - 2 reps - 20 hold - Seated Thoracic Lumbar Extension with Pectoralis Stretch  - 1 x daily - 7 x weekly - 1 sets - 10 reps - doorway hip flexor stretch   - 1 x daily - 7 x weekly - 3 sets - 5 reps - Clamshell with Resistance  - 1 x daily - 7 x weekly - 2 sets - 10 reps - Bird Dog  - 1 x daily - 7 x weekly - 1 sets - 5 reps - 5 hold - Standing Hip Hinge with Dowel  - 1 x daily - 7 x weekly - 1 sets - 10 reps - Marching with Same-Side Arm Raise and Walker  - 1 x daily - 7 x weekly - 1 sets - 10 reps - Plank on Knees  - 1 x daily - 7 x weekly - 1 sets - 5 reps - 5 hold - Half Deadlift with Kettlebell  - 1 x daily - 7 x weekly - 1 sets - 10 reps - Forward Step Down Touch with Heel  - 1 x daily - 7 x weekly - 1 sets - 10 reps - Marching with Same-Side Arm Raise  - 1 x daily - 7 x weekly - 1 sets - 10 reps - Standing Anti-Rotation Press with Anchored Resistance  - 1 x daily - 7 x weekly - 5 sets - 15 reps - Insurance claims handler (Mirrored)  - 1 x daily - 7 x weekly - 1 sets - 10 reps - SNATCH AND PRESS OVERHEAD  - 1 x daily - 7 x weekly - 1 sets - 10 reps - Side Plank on Knees  - 1 x daily - 7 x weekly - 1 sets - 10 reps - Supine Lower Trunk Rotation  - 1 x daily - 7 x weekly - 1 sets - 10 reps - Hooklying Hamstring Stretch with  Strap  - 1 x daily - 7 x weekly - 1 sets - 5 reps - Hooklying Isometric Hip Flexion with Opposite Arm  - 1 x daily - 7 x weekly - 1 sets - 10 reps - Hip Flexor Stretch with Chair  - 1 x daily - 7 x weekly - 1 sets - 10 reps - Shoulder extension with resistance - Neutral  - 1 x daily - 7 x weekly - 1 sets - 10 reps - Single-Leg Anti-Rotation Press With Anchored Resistance  - 1 x daily - 7 x weekly - 1 sets - 10 reps - Sidelying Open Book Thoracic Rotation with Knee on Foam Roll  - 1 x daily - 7 x weekly - 1 sets - 10 reps - Quadruped Thoracic Rotation Full Range with Hand on Neck  - 1 x daily - 7 x weekly - 3 sets - 10 reps - Standing Diagonal Chops with Medicine Diona Foley  -  1 x daily - 7 x weekly - 3 sets - 10 reps - Reverse Chop with Elbows Straight  - 1 x daily - 7 x weekly - 1 sets - 10 reps - Supine Hip Internal and External Rotation  - 1 x daily - 7 x weekly - 2 sets - 10 reps - Cat Cow  - 1 x daily - 7 x weekly - 1 sets - 10 reps ASSESSMENT:   CLINICAL IMPRESSION:  Pt reports he is free of back pain and manages ongoing stiffness successfully with consistent performance of HEP.  He has met all goals.  He demos end range stiffness in lumbar spine and hips without pain and has targeted stretches and ROM for these regions.  He understands he needs to continue stretching bil LE and trunk to maintain gains from PT.  He demos good understanding of proper body mechanics for bending, squatting, lifting and transitions of positions to minimize risk of re-injury.  He has a secondary Lt hamstring strain that he understands how to safely stretch and protect as he returns to golf.  Pt is ready to d/c at this time.   OBJECTIVE IMPAIRMENTS decreased ROM, decreased strength, increased fascial restrictions, and pain.    ACTIVITY LIMITATIONS community activity, meal prep, yard work, and shopping.    PERSONAL FACTORS Past/current experiences are also affecting patient's functional outcome.      REHAB POTENTIAL:  Good   CLINICAL DECISION MAKING: Stable/uncomplicated   EVALUATION COMPLEXITY: Low     GOALS: Goals reviewed with patient? Yes   SHORT TERM GOALS: Target date: 03/16/2022   The patient will demonstrate knowledge of basic self care strategies and exercises to promote healing   Baseline: Goal status: met  2.  The patient will report a 50% improvement in pain levels with functional activities which are currently difficult including lying down and with morning time ADLS Baseline:  Goal status: met   3.  The patient will be report a 50% improvement in sleep Baseline:  Goal status: met     LONG TERM GOALS: Target date: 04/13/2022   The patient will be independent in a safe self progression of a home exercise program to promote further recovery of function   Baseline:  Goal status: MET   2.  The patient will report a 75% improvement in pain levels with functional activities  Baseline:  Goal status: MET   3.  The patient will have improved trunk flexor and extensor muscle strength to at least 4+/5 needed for lifting medium weight objects such as grocery bags, laundry and luggage  Baseline:  Goal status: MET   4.  The patient will have improved right hip strength to at least 4+/5 needed for standing, walking longer distances and descending stairs at home and in the community and return to golf Baseline:  Goal status: MET   5.  FOTO score improved from 67% to 74% Baseline: 67%, 75% ON 03/18/22 Goal status: MET     PLAN: PT FREQUENCY: 2x/week   PT DURATION: 8 weeks   PLANNED INTERVENTIONS: Therapeutic exercises, Therapeutic activity, Neuromuscular re-education, Balance training, Gait training, Patient/Family education, Joint mobilization, Aquatic Therapy, Dry Needling, Electrical stimulation, Spinal mobilization, Moist heat, Traction, Ultrasound, Ionotophoresis 4mg /ml Dexamethasone, and Manual therapy.   PLAN FOR NEXT SESSION:  d/c to HEP at this time  PHYSICAL THERAPY  DISCHARGE SUMMARY  Visits from Start of Care: 14  Current functional level related to goals / functional outcomes: All goals met   Remaining  deficits: Has a secondary tweaked Lt hamstring   Education / Equipment: HEP   Patient agrees to discharge. Patient goals were met. Patient is being discharged due to meeting the stated rehab goals.  Emanual Lamountain, PT 04/13/22 10:13 AM

## 2022-04-14 DIAGNOSIS — H2513 Age-related nuclear cataract, bilateral: Secondary | ICD-10-CM | POA: Diagnosis not present

## 2022-04-29 DIAGNOSIS — H8110 Benign paroxysmal vertigo, unspecified ear: Secondary | ICD-10-CM | POA: Diagnosis not present

## 2022-05-28 ENCOUNTER — Ambulatory Visit (INDEPENDENT_AMBULATORY_CARE_PROVIDER_SITE_OTHER): Payer: Self-pay | Admitting: *Deleted

## 2022-05-28 DIAGNOSIS — B351 Tinea unguium: Secondary | ICD-10-CM

## 2022-05-28 NOTE — Progress Notes (Signed)
Patient presents today for the 6th laser treatment. Diagnosed with mycotic nail infection by Dr. Cannon Kettle. He will be doing his follow ups with Dr. Posey Pronto.  Toenail most affected bilateral hallux. The nails are looking better and he is pleased with the progress so far.  All other systems are negative.  Nails were filed thin. Laser therapy was administered to bilateral 1-5 toenails  and patient tolerated the treatment well. All safety precautions were in place.    Patient has completed the recommended laser treatments. He will follow up with Dr. Posey Pronto in 2 months to evaluate progress.

## 2022-07-23 ENCOUNTER — Ambulatory Visit: Payer: Medicare PPO | Admitting: Podiatry

## 2022-07-28 ENCOUNTER — Ambulatory Visit: Payer: Medicare PPO | Admitting: Podiatry

## 2022-07-28 DIAGNOSIS — B351 Tinea unguium: Secondary | ICD-10-CM

## 2022-07-28 NOTE — Progress Notes (Signed)
Subjective:  Patient ID: Jesse [name], male    DOB: 1951-09-16,  MRN: [id number]  Chief Complaint  Patient presents with   Nail Problem    Laser treatment follow up     71 y.o. male presents with the above complaint.  Patient presents with follow-up of bilateral hallux onychomycosis with thickened elongated dystrophic mycotic nail.  Patient has been on multiple sessions of laser therapy.  There is definitely some improvement.  He would like to discuss if should continue with laser.  He denies any other acute complaints.   Review of Systems: Negative except as noted in the HPI. Denies N/V/F/Ch.  Past Medical History:  Diagnosis Date   Arthritis    knees   Asthma    seasonal   Eczema    GERD (gastroesophageal reflux disease)    HLD (hyperlipidemia)    HTN (hypertension)    Pulmonary emboli ([place])    post op knee scope for a few months   Sleep apnea 2004   TA (temporal arteritis) ([place])    Wears glasses     Current Outpatient Medications:    albuterol (PROVENTIL HFA;VENTOLIN HFA) 108 (90 BASE) MCG/ACT inhaler, Inhale 2 puffs into the lungs every 6 (six) hours as needed for wheezing. , Disp: , Rfl:    amoxicillin (AMOXIL) 500 MG tablet, Take 2,000 mg by mouth See admin instructions. Take 2000 mg 1 hour prior to dental work (Patient not taking: Reported on 05/28/2021), Disp: , Rfl: 1   aspirin EC 81 MG tablet, Take 81 mg by mouth daily. Swallow whole., Disp: , Rfl:    atorvastatin (LIPITOR) 10 MG tablet, Take 10 mg by mouth daily., Disp: , Rfl:    Brimonidine Tartrate (LUMIFY) 0.025 % SOLN, Place 1 drop into both eyes daily as needed (redness)., Disp: , Rfl:    cetirizine (ZYRTEC) 10 MG tablet, Take 10 mg by mouth daily., Disp: , Rfl:    clobetasol ointment (TEMOVATE) 0.05 %, clobetasol 0.05 % topical ointment  APPLY TO HANDS TWICE A DAY OR AS MUCH AS YOU WASH HANDS FOR 2 WEEKS, Disp: , Rfl:    fluocinonide cream (LIDEX) 9.56 %, Apply 1 application topically 2 (two) times daily as  needed (outbreaks)., Disp: , Rfl:    fluticasone (FLOVENT HFA) 110 MCG/ACT inhaler, Inhale 1 puff into the lungs daily as needed (allergies). , Disp: , Rfl:    hydrochlorothiazide (HYDRODIURIL) 25 MG tablet, Take 25 mg by mouth daily., Disp: , Rfl:    hydrOXYzine (ATARAX) 10 MG tablet, Take by mouth., Disp: , Rfl:    levocetirizine (XYZAL) 5 MG tablet, Take by mouth., Disp: , Rfl:    naproxen sodium (ALEVE) 220 MG tablet, 1 tablet with food or milk as needed, Disp: , Rfl:    sildenafil (REVATIO) 20 MG tablet, Take 60 mg by mouth daily as needed for erectile dysfunction., Disp: , Rfl:   Social History   Tobacco Use  Smoking Status Light Smoker   Types: Cigarettes  Smokeless Tobacco Never  Tobacco Comments   smokes few cigs each year    Allergies  Allergen Reactions   Adhesive [Tape] Other (See Comments)    Skin bumps use paper tape   Oxycodone Itching   Objective:  There were no vitals filed for this visit. There is no height or weight on file to calculate BMI. Constitutional Well developed. Well nourished.  Vascular Dorsalis pedis pulses palpable bilaterally. Posterior tibial pulses palpable bilaterally. Capillary refill normal to all digits.  No  cyanosis or clubbing noted. Pedal hair growth normal.  Neurologic Normal speech. Oriented to person, place, and time. Epicritic sensation to light touch grossly present bilaterally.  Dermatologic Nails thickened elongated dystrophic mycotic nails x2 bilateral hallux.  Improvement of fungal growth noted Skin within normal limits  Orthopedic: Normal joint ROM without pain or crepitus bilaterally. No visible deformities. No bony tenderness.   Radiographs: None Assessment:   1. Nail fungus    Plan:  Patient was evaluated and treated and all questions answered.  Bilateral hallux onychomycosis/nail fungus -I discussed with the patient the importance and etiology of onychomycosis and various treatment options were discussed.   Patient would like to still continue with laser.  I discussed with him that continue few more sessions and see if there is an improvement further.  At this time clinically the patient has improved considerably.  No follow-ups on file.

## 2022-08-03 ENCOUNTER — Ambulatory Visit: Payer: Self-pay

## 2022-08-03 DIAGNOSIS — B351 Tinea unguium: Secondary | ICD-10-CM

## 2022-08-03 NOTE — Progress Notes (Signed)
Patient presents today for the 1st laser treatment of 2nd round. Diagnosed with mycotic nail infection by Dr. Cannon Kettle. He will be doing his follow ups with Dr. Posey Pronto.  Toenail most affected bilateral hallux. The nails are looking better and he is pleased with the progress so far.  All other systems are negative.  Nails were filed thin. Laser therapy was administered to bilateral 1-5 toenails  and patient tolerated the treatment well. All safety precautions were in place.    Patient has completed the recommended laser treatments. He will follow up in 6 weeks for 2nd treatment of 2nd round.

## 2022-08-04 ENCOUNTER — Other Ambulatory Visit: Payer: Medicare PPO

## 2022-08-06 DIAGNOSIS — J45909 Unspecified asthma, uncomplicated: Secondary | ICD-10-CM | POA: Diagnosis not present

## 2022-08-06 DIAGNOSIS — Z125 Encounter for screening for malignant neoplasm of prostate: Secondary | ICD-10-CM | POA: Diagnosis not present

## 2022-08-06 DIAGNOSIS — I10 Essential (primary) hypertension: Secondary | ICD-10-CM | POA: Diagnosis not present

## 2022-08-06 DIAGNOSIS — Z1159 Encounter for screening for other viral diseases: Secondary | ICD-10-CM | POA: Diagnosis not present

## 2022-08-06 DIAGNOSIS — E785 Hyperlipidemia, unspecified: Secondary | ICD-10-CM | POA: Diagnosis not present

## 2022-08-06 DIAGNOSIS — Z5181 Encounter for therapeutic drug level monitoring: Secondary | ICD-10-CM | POA: Diagnosis not present

## 2022-08-06 DIAGNOSIS — R39198 Other difficulties with micturition: Secondary | ICD-10-CM | POA: Diagnosis not present

## 2022-08-06 DIAGNOSIS — Z0000 Encounter for general adult medical examination without abnormal findings: Secondary | ICD-10-CM | POA: Diagnosis not present

## 2022-08-25 DIAGNOSIS — M79641 Pain in right hand: Secondary | ICD-10-CM | POA: Diagnosis not present

## 2022-08-25 DIAGNOSIS — M65311 Trigger thumb, right thumb: Secondary | ICD-10-CM | POA: Diagnosis not present

## 2022-09-21 ENCOUNTER — Ambulatory Visit: Payer: Medicare PPO

## 2022-09-21 DIAGNOSIS — B351 Tinea unguium: Secondary | ICD-10-CM

## 2022-09-21 NOTE — Progress Notes (Signed)
Patient presents today for the 2nd laser treatment of second round. Diagnosed with mycotic nail infection by Dr. Posey Pronto.   Toenail most affected bilateral hallux .  All other systems are negative.  Nails were filed thin. Laser therapy was administered to 1-5 toenails bilateral and patient tolerated the treatment well. All safety precautions were in place.   Single laser pass was done on non-affected nails.   Follow up in 6 weeks for laser # 3.  Picture of nails taken today to document visual progress

## 2022-10-21 ENCOUNTER — Ambulatory Visit: Payer: Medicare PPO | Admitting: Podiatry

## 2022-10-21 DIAGNOSIS — M722 Plantar fascial fibromatosis: Secondary | ICD-10-CM | POA: Diagnosis not present

## 2022-10-21 MED ORDER — MELOXICAM 15 MG PO TABS: 15.0000 mg | ORAL_TABLET | Freq: Every day | ORAL | 0 refills | Status: AC

## 2022-10-21 NOTE — Progress Notes (Signed)
  Subjective:  Patient ID: Jesse [name], male    DOB: [birth year],  MRN: [id number]  Chief Complaint  Patient presents with   Plantar Fasciitis    Flare up left heel - same spot he had pain before - doing exercises and icing, but he just can't get it under control - requesting an injection    71 y.o. male presents with the above complaint. History confirmed with patient.  He had an injection earlier this spring for the same issue which completely alleviated at the time  Objective:  Physical Exam: warm, good capillary refill, no trophic changes or ulcerative lesions, normal DP and PT pulses, and normal sensory exam. Left Foot: point tenderness over the heel pad  Assessment:   1. Plantar fasciitis, left      Plan:  Patient was evaluated and treated and all questions answered.   Discussed the etiology and treatment options for plantar fasciitis including stretching, formal physical therapy, supportive shoegears such as a running shoe or sneaker, pre fabricated orthoses, injection therapy, and oral medications. We also discussed the role of surgical treatment of this for patients who do not improve after exhausting non-surgical treatment options.   -Educated patient on stretching and icing of the affected limb -Injection delivered to the plantar fascia of the left foot. -Rx for meloxicam. Educated on use, risks and benefits of the medication  After sterile prep with povidone-iodine solution and alcohol, the left heel was injected with 0.5cc 2% xylocaine plain, 0.5cc 0.5% marcaine plain, '5mg'$  triamcinolone acetonide, and '2mg'$  dexamethasone was injected along the medial plantar fascia at the insertion on the plantar calcaneus. The patient tolerated the procedure well without complication.  Return if symptoms worsen or fail to improve.

## 2022-10-21 NOTE — Patient Instructions (Signed)

## 2022-11-02 ENCOUNTER — Ambulatory Visit (INDEPENDENT_AMBULATORY_CARE_PROVIDER_SITE_OTHER): Payer: Medicare PPO

## 2022-11-02 DIAGNOSIS — B351 Tinea unguium: Secondary | ICD-10-CM

## 2022-11-02 NOTE — Progress Notes (Signed)
Patient presents today for the 3rd laser treatment of second round. Diagnosed with mycotic nail infection by Dr. Posey Pronto.   Toenail most affected bilateral hallux .  All other systems are negative.  Nails were filed thin. Laser therapy was administered to 1-5 toenails bilateral and patient tolerated the treatment well. All safety precautions were in place.   Single laser pass was done on non-affected nails.   Follow up in 6 weeks for laser # 4.

## 2022-11-30 ENCOUNTER — Ambulatory Visit: Payer: Medicare PPO | Admitting: Podiatry

## 2022-11-30 VITALS — BP 136/78

## 2022-11-30 DIAGNOSIS — M722 Plantar fascial fibromatosis: Secondary | ICD-10-CM | POA: Diagnosis not present

## 2022-11-30 NOTE — Progress Notes (Signed)
Subjective:  Patient ID: Jesse [name], male    DOB: July 31, 1951,  MRN: [id number]  Chief Complaint  Patient presents with   Plantar Fasciitis    72 y.o. male presents with the above complaint.  Patient presents with left heel pain that has come out of nowhere has progressive gotten worse.  He went to get it evaluated he has not seen anyone as prior to seeing me for this.  Hurts with ambulation hurts with pressure taking for step in the morning is painful.  Pain scale is 5 out of 10 dull achy in nature right at the center part of the heel   Review of Systems: Negative except as noted in the HPI. Denies N/V/F/Ch.  Past Medical History:  Diagnosis Date   Arthritis    knees   Asthma    seasonal   Eczema    GERD (gastroesophageal reflux disease)    HLD (hyperlipidemia)    HTN (hypertension)    Pulmonary emboli ([place])    post op knee scope for a few months   Sleep apnea 2004   TA (temporal arteritis) ([place])    Wears glasses     Current Outpatient Medications:    albuterol (PROVENTIL HFA;VENTOLIN HFA) 108 (90 BASE) MCG/ACT inhaler, Inhale 2 puffs into the lungs every 6 (six) hours as needed for wheezing. , Disp: , Rfl:    amoxicillin (AMOXIL) 500 MG tablet, Take 2,000 mg by mouth See admin instructions. Take 2000 mg 1 hour prior to dental work (Patient not taking: Reported on 05/28/2021), Disp: , Rfl: 1   aspirin EC 81 MG tablet, Take 81 mg by mouth daily. Swallow whole., Disp: , Rfl:    atorvastatin (LIPITOR) 10 MG tablet, Take 10 mg by mouth daily., Disp: , Rfl:    Brimonidine Tartrate (LUMIFY) 0.025 % SOLN, Place 1 drop into both eyes daily as needed (redness)., Disp: , Rfl:    cetirizine (ZYRTEC) 10 MG tablet, Take 10 mg by mouth daily., Disp: , Rfl:    clobetasol ointment (TEMOVATE) 0.05 %, clobetasol 0.05 % topical ointment  APPLY TO HANDS TWICE A DAY OR AS MUCH AS YOU WASH HANDS FOR 2 WEEKS, Disp: , Rfl:    fluocinonide cream (LIDEX) AB-123456789 %, Apply 1 application topically 2 (two)  times daily as needed (outbreaks)., Disp: , Rfl:    fluticasone (FLOVENT HFA) 110 MCG/ACT inhaler, Inhale 1 puff into the lungs daily as needed (allergies). , Disp: , Rfl:    hydrochlorothiazide (HYDRODIURIL) 25 MG tablet, Take 25 mg by mouth daily., Disp: , Rfl:    hydrOXYzine (ATARAX) 10 MG tablet, Take by mouth., Disp: , Rfl:    levocetirizine (XYZAL) 5 MG tablet, Take by mouth., Disp: , Rfl:    meloxicam (MOBIC) 15 MG tablet, Take 1 tablet (15 mg total) by mouth daily., Disp: 30 tablet, Rfl: 0   sildenafil (REVATIO) 20 MG tablet, Take 60 mg by mouth daily as needed for erectile dysfunction., Disp: , Rfl:   Social History   Tobacco Use  Smoking Status Light Smoker   Types: Cigarettes  Smokeless Tobacco Never  Tobacco Comments   smokes few cigs each year    Allergies  Allergen Reactions   Adhesive [Tape] Other (See Comments)    Skin bumps use paper tape   Oxycodone Itching   Objective:   Vitals:   11/30/22 0923  BP: 136/78   There is no height or weight on file to calculate BMI. Constitutional Well developed. Well nourished.  Vascular  Dorsalis pedis pulses palpable bilaterally. Posterior tibial pulses palpable bilaterally. Capillary refill normal to all digits.  No cyanosis or clubbing noted. Pedal hair growth normal.  Neurologic Normal speech. Oriented to person, place, and time. Epicritic sensation to light touch grossly present bilaterally.  Dermatologic Nails well groomed and normal in appearance. No open wounds. No skin lesions.  Orthopedic: Normal joint ROM without pain or crepitus bilaterally. No visible deformities. Tender to palpation at the calcaneal tuber left. No pain with calcaneal squeeze left. Ankle ROM diminished range of motion left. Silfverskiold Test: positive left.   Radiographs: None  Assessment:   1. Plantar fasciitis, left    Plan:  Patient was evaluated and treated and all questions answered.  Plantar Fasciitis, left - XR reviewed  as above.  - Educated on icing and stretching. Instructions given.  - Injection delivered to the plantar fascia as below. - DME: Plantar fascial brace dispensed to support the medial longitudinal arch of the foot and offload pressure from the heel and prevent arch collapse during weightbearing - Pharmacologic management: None  Procedure: Injection Tendon/Ligament Location: Left plantar fascia at the glabrous junction; medial approach. Skin Prep: alcohol Injectate: 0.5 cc 0.5% marcaine plain, 0.5 cc of 1% Lidocaine, 0.5 cc kenalog 10. Disposition: Patient tolerated procedure well. Injection site dressed with a band-aid.  No follow-ups on file.

## 2022-12-14 ENCOUNTER — Other Ambulatory Visit: Payer: Medicare PPO

## 2022-12-28 ENCOUNTER — Ambulatory Visit: Payer: Medicare PPO | Admitting: Podiatry

## 2022-12-28 DIAGNOSIS — M722 Plantar fascial fibromatosis: Secondary | ICD-10-CM | POA: Diagnosis not present

## 2022-12-28 DIAGNOSIS — M62462 Contracture of muscle, left lower leg: Secondary | ICD-10-CM | POA: Diagnosis not present

## 2022-12-28 NOTE — Progress Notes (Signed)
Subjective:  Patient ID: Jesse [name], male    DOB: 01-21-1951,  MRN: [id number]  Chief Complaint  Patient presents with   Plantar Fasciitis    Pt stated that he is a lot better     72 y.o. male presents with the above complaint.  Patient presents with follow-up of left Plan[place] fasciitis.  He states the injection helped considerably he still has a little residual pain about 80 to 85% better.  He would like to discuss next treatment plan.  Has been doing more stretches.   Review of Systems: Negative except as noted in the HPI. Denies N/V/F/Ch.  Past Medical History:  Diagnosis Date   Arthritis    knees   Asthma    seasonal   Eczema    GERD (gastroesophageal reflux disease)    HLD (hyperlipidemia)    HTN (hypertension)    Pulmonary emboli ([place])    post op knee scope for a few months   Sleep apnea 2004   TA (temporal arteritis) ([place])    Wears glasses     Current Outpatient Medications:    albuterol (PROVENTIL HFA;VENTOLIN HFA) 108 (90 BASE) MCG/ACT inhaler, Inhale 2 puffs into the lungs every 6 (six) hours as needed for wheezing. , Disp: , Rfl:    amoxicillin (AMOXIL) 500 MG tablet, Take 2,000 mg by mouth See admin instructions. Take 2000 mg 1 hour prior to dental work (Patient not taking: Reported on 05/28/2021), Disp: , Rfl: 1   aspirin EC 81 MG tablet, Take 81 mg by mouth daily. Swallow whole., Disp: , Rfl:    atorvastatin (LIPITOR) 10 MG tablet, Take 10 mg by mouth daily., Disp: , Rfl:    Brimonidine Tartrate (LUMIFY) 0.025 % SOLN, Place 1 drop into both eyes daily as needed (redness)., Disp: , Rfl:    cetirizine (ZYRTEC) 10 MG tablet, Take 10 mg by mouth daily., Disp: , Rfl:    clobetasol ointment (TEMOVATE) 0.05 %, clobetasol 0.05 % topical ointment  APPLY TO HANDS TWICE A DAY OR AS MUCH AS YOU WASH HANDS FOR 2 WEEKS, Disp: , Rfl:    fluocinonide cream (LIDEX) AB-123456789 %, Apply 1 application topically 2 (two) times daily as needed (outbreaks)., Disp: , Rfl:    fluticasone  (FLOVENT HFA) 110 MCG/ACT inhaler, Inhale 1 puff into the lungs daily as needed (allergies). , Disp: , Rfl:    hydrochlorothiazide (HYDRODIURIL) 25 MG tablet, Take 25 mg by mouth daily., Disp: , Rfl:    hydrOXYzine (ATARAX) 10 MG tablet, Take by mouth., Disp: , Rfl:    levocetirizine (XYZAL) 5 MG tablet, Take by mouth., Disp: , Rfl:    meloxicam (MOBIC) 15 MG tablet, Take 1 tablet (15 mg total) by mouth daily., Disp: 30 tablet, Rfl: 0   sildenafil (REVATIO) 20 MG tablet, Take 60 mg by mouth daily as needed for erectile dysfunction., Disp: , Rfl:   Social History   Tobacco Use  Smoking Status Light Smoker   Types: Cigarettes  Smokeless Tobacco Never  Tobacco Comments   smokes few cigs each year    Allergies  Allergen Reactions   Adhesive [Tape] Other (See Comments)    Skin bumps use paper tape   Oxycodone Itching   Objective:   There were no vitals filed for this visit.  There is no height or weight on file to calculate BMI. Constitutional Well developed. Well nourished.  Vascular Dorsalis pedis pulses palpable bilaterally. Posterior tibial pulses palpable bilaterally. Capillary refill normal to all digits.  No cyanosis or clubbing noted. Pedal hair growth normal.  Neurologic Normal speech. Oriented to person, place, and time. Epicritic sensation to light touch grossly present bilaterally.  Dermatologic Nails well groomed and normal in appearance. No open wounds. No skin lesions.  Orthopedic: Normal joint ROM without pain or crepitus bilaterally. No visible deformities. Tender to palpation at the calcaneal tuber left. No pain with calcaneal squeeze left. Ankle ROM diminished range of motion left. Silfverskiold Test: positive left.   Radiographs: None  Assessment:   No diagnosis found.  Plan:  Patient was evaluated and treated and all questions answered.  Plantar Fasciitis, left with underlying equinus with underlying ankle equinus - XR reviewed as above.  -  Educated on icing and stretching. Instructions given.  -Second injection delivered to the plantar fascia as below. - DME: Plantar fascial brace dispensed to support the medial longitudinal arch of the foot and offload pressure from the heel and prevent arch collapse during weightbearing - Pharmacologic management: None  Procedure: Injection Tendon/Ligament Location: Left plantar fascia at the glabrous junction; medial approach. Skin Prep: alcohol Injectate: 0.5 cc 0.5% marcaine plain, 0.5 cc of 1% Lidocaine, 0.5 cc kenalog 10. Disposition: Patient tolerated procedure well. Injection site dressed with a band-aid.  No follow-ups on file.

## 2023-01-25 NOTE — Addendum Note (Signed)
Addended by: Geni Bers on: 01/25/2023 09:38 AM   Modules accepted: Level of Service

## 2023-03-16 DIAGNOSIS — J029 Acute pharyngitis, unspecified: Secondary | ICD-10-CM | POA: Diagnosis not present

## 2023-03-16 DIAGNOSIS — J302 Other seasonal allergic rhinitis: Secondary | ICD-10-CM | POA: Diagnosis not present

## 2023-04-27 DIAGNOSIS — L03011 Cellulitis of right finger: Secondary | ICD-10-CM | POA: Diagnosis not present

## 2023-05-17 DIAGNOSIS — H2513 Age-related nuclear cataract, bilateral: Secondary | ICD-10-CM | POA: Diagnosis not present

## 2023-05-23 ENCOUNTER — Encounter: Payer: Self-pay | Admitting: Podiatry

## 2023-05-23 ENCOUNTER — Ambulatory Visit: Payer: Medicare PPO | Admitting: Podiatry

## 2023-05-23 VITALS — BP 123/81 | HR 73

## 2023-05-23 DIAGNOSIS — B353 Tinea pedis: Secondary | ICD-10-CM

## 2023-05-23 DIAGNOSIS — R234 Changes in skin texture: Secondary | ICD-10-CM

## 2023-05-23 MED ORDER — CLOTRIMAZOLE-BETAMETHASONE 1-0.05 % EX CREA: 1.0000 | TOPICAL_CREAM | Freq: Every day | CUTANEOUS | 2 refills | Status: AC

## 2023-05-23 NOTE — Progress Notes (Signed)
  Subjective:  Patient ID: Jesse [name], male    DOB: 10/12/1951,  MRN: [id number]  Chief Complaint  Patient presents with   Callouses    "It's on my left big toe.  It have this little flap of skin on it." N - flap of skin L - hallux left D - about 2 weeks O - it started like a little tear, gradually worse C - rough, cracks A - none T - keep it moisturized, Neosporin    72 y.o. male presents with the above complaint. History confirmed with patient.  Objective:  Physical Exam: warm, good capillary refill, no trophic changes or ulcerative lesions, normal DP and PT pulses, and normal sensory exam. Left Foot: Fissure over medial hallux with cracking and dry skin, tinea pedis scaling moccasin distribution rash  Assessment:   1. Tinea pedis of left foot   2. Fissure of skin      Plan:  Patient was evaluated and treated and all questions answered.    Discussed the etiology and treatment options for tinea pedis.  Discussed topical and oral treatment.  Recommended topical treatment with Lotrisone cream.  This was sent to the patient's pharmacy.     No follow-ups on file.

## 2023-06-09 DIAGNOSIS — H02881 Meibomian gland dysfunction right upper eyelid: Secondary | ICD-10-CM | POA: Diagnosis not present

## 2023-06-09 DIAGNOSIS — H00011 Hordeolum externum right upper eyelid: Secondary | ICD-10-CM | POA: Diagnosis not present

## 2023-06-09 DIAGNOSIS — H02884 Meibomian gland dysfunction left upper eyelid: Secondary | ICD-10-CM | POA: Diagnosis not present

## 2023-06-13 DIAGNOSIS — L309 Dermatitis, unspecified: Secondary | ICD-10-CM | POA: Diagnosis not present

## 2023-06-14 DIAGNOSIS — H00011 Hordeolum externum right upper eyelid: Secondary | ICD-10-CM | POA: Diagnosis not present

## 2023-06-14 DIAGNOSIS — H02881 Meibomian gland dysfunction right upper eyelid: Secondary | ICD-10-CM | POA: Diagnosis not present

## 2023-06-14 DIAGNOSIS — H1131 Conjunctival hemorrhage, right eye: Secondary | ICD-10-CM | POA: Diagnosis not present

## 2023-06-14 DIAGNOSIS — H02884 Meibomian gland dysfunction left upper eyelid: Secondary | ICD-10-CM | POA: Diagnosis not present

## 2023-06-20 DIAGNOSIS — H00011 Hordeolum externum right upper eyelid: Secondary | ICD-10-CM | POA: Diagnosis not present

## 2023-06-22 DIAGNOSIS — H00011 Hordeolum externum right upper eyelid: Secondary | ICD-10-CM | POA: Diagnosis not present

## 2023-06-22 DIAGNOSIS — H02842 Edema of right lower eyelid: Secondary | ICD-10-CM | POA: Diagnosis not present

## 2023-07-14 DIAGNOSIS — L309 Dermatitis, unspecified: Secondary | ICD-10-CM | POA: Diagnosis not present

## 2023-07-22 DIAGNOSIS — H00011 Hordeolum externum right upper eyelid: Secondary | ICD-10-CM | POA: Diagnosis not present

## 2023-07-22 DIAGNOSIS — H02842 Edema of right lower eyelid: Secondary | ICD-10-CM | POA: Diagnosis not present

## 2023-08-03 DIAGNOSIS — H02842 Edema of right lower eyelid: Secondary | ICD-10-CM | POA: Diagnosis not present

## 2023-08-03 DIAGNOSIS — H00011 Hordeolum externum right upper eyelid: Secondary | ICD-10-CM | POA: Diagnosis not present

## 2023-08-15 DIAGNOSIS — L309 Dermatitis, unspecified: Secondary | ICD-10-CM | POA: Diagnosis not present

## 2023-08-18 DIAGNOSIS — L239 Allergic contact dermatitis, unspecified cause: Secondary | ICD-10-CM | POA: Diagnosis not present

## 2023-08-19 DIAGNOSIS — H00011 Hordeolum externum right upper eyelid: Secondary | ICD-10-CM | POA: Diagnosis not present

## 2023-08-19 DIAGNOSIS — H02842 Edema of right lower eyelid: Secondary | ICD-10-CM | POA: Diagnosis not present

## 2023-08-22 DIAGNOSIS — I10 Essential (primary) hypertension: Secondary | ICD-10-CM | POA: Diagnosis not present

## 2023-08-22 DIAGNOSIS — J45909 Unspecified asthma, uncomplicated: Secondary | ICD-10-CM | POA: Diagnosis not present

## 2023-08-22 DIAGNOSIS — R39198 Other difficulties with micturition: Secondary | ICD-10-CM | POA: Diagnosis not present

## 2023-08-22 DIAGNOSIS — E785 Hyperlipidemia, unspecified: Secondary | ICD-10-CM | POA: Diagnosis not present

## 2023-08-22 DIAGNOSIS — Z5181 Encounter for therapeutic drug level monitoring: Secondary | ICD-10-CM | POA: Diagnosis not present

## 2023-08-22 DIAGNOSIS — Z0000 Encounter for general adult medical examination without abnormal findings: Secondary | ICD-10-CM | POA: Diagnosis not present

## 2023-10-03 DIAGNOSIS — R1030 Lower abdominal pain, unspecified: Secondary | ICD-10-CM | POA: Diagnosis not present

## 2023-10-03 DIAGNOSIS — Z8719 Personal history of other diseases of the digestive system: Secondary | ICD-10-CM | POA: Diagnosis not present

## 2023-10-28 ENCOUNTER — Other Ambulatory Visit: Payer: Self-pay | Admitting: Surgery

## 2023-10-28 DIAGNOSIS — R1031 Right lower quadrant pain: Secondary | ICD-10-CM | POA: Diagnosis not present

## 2023-10-28 DIAGNOSIS — G8929 Other chronic pain: Secondary | ICD-10-CM

## 2023-11-03 ENCOUNTER — Other Ambulatory Visit: Payer: Medicare PPO

## 2023-11-03 ENCOUNTER — Ambulatory Visit
Admission: RE | Admit: 2023-11-03 | Discharge: 2023-11-03 | Disposition: A | Discharge status: Home / Self Care | Payer: Medicare PPO | Source: Ambulatory Visit | Attending: Surgery | Admitting: Surgery

## 2023-11-03 DIAGNOSIS — G8929 Other chronic pain: Secondary | ICD-10-CM

## 2023-11-03 DIAGNOSIS — K5730 Diverticulosis of large intestine without perforation or abscess without bleeding: Secondary | ICD-10-CM | POA: Diagnosis not present

## 2023-11-03 DIAGNOSIS — K4090 Unilateral inguinal hernia, without obstruction or gangrene, not specified as recurrent: Secondary | ICD-10-CM | POA: Diagnosis not present

## 2023-11-03 DIAGNOSIS — N400 Enlarged prostate without lower urinary tract symptoms: Secondary | ICD-10-CM | POA: Diagnosis not present

## 2023-11-03 DIAGNOSIS — I700 Atherosclerosis of aorta: Secondary | ICD-10-CM | POA: Diagnosis not present

## 2023-11-03 MED ORDER — IOPAMIDOL (ISOVUE-300) INJECTION 61%
100.0000 mL | Freq: Once | INTRAVENOUS | Status: AC | PRN
  Administered 2023-11-03: 100 mL via INTRAVENOUS

## 2023-11-07 ENCOUNTER — Ambulatory Visit: Payer: Self-pay | Admitting: Surgery

## 2023-11-07 DIAGNOSIS — L80 Vitiligo: Secondary | ICD-10-CM | POA: Diagnosis not present

## 2023-11-07 DIAGNOSIS — L308 Other specified dermatitis: Secondary | ICD-10-CM | POA: Diagnosis not present

## 2023-11-07 DIAGNOSIS — L603 Nail dystrophy: Secondary | ICD-10-CM | POA: Diagnosis not present

## 2023-11-07 NOTE — Progress Notes (Signed)
 Please call the patient and let them know that their scan showed a recurrent right inguinal hernia.  He will need to have surgery to repair this.  If he would like to come in to discuss it, please schedule the appointment.  Otherwise, we can proceed with scheduling.

## 2023-11-07 NOTE — H&P (Signed)
 Subjective    Chief Complaint: New Consultation ( Right inguinal pain)       History of Present Illness: Jesse [name] is a 73 y.o. male who is seen today as an office consultation at the request of Dr. Kip for evaluation of New Consultation ( Right inguinal pain) .   This is a 73 year old male who is status post right inguinal hernia repair with ProGrip mesh on 05/25/2013.  He had a large indirect hernia at that time.  The patient had been doing well for the last 10 years.  Recently, he noticed some discomfort in his right groin.  He palpated this area felt some firmness.  He mentioned this to his primary care physician who referred him back to us  for evaluation for possible recurrent hernia.  No GI obstructive symptoms.  No urinary symptoms.     Review of Systems: A complete review of systems was obtained from the patient.  I have reviewed this information and discussed as appropriate with the patient.  See HPI as well for other ROS.   Review of Systems  HENT:  Positive for tinnitus.   Skin:  Positive for itching.        Medical History: Past Medical History      Past Medical History:  Diagnosis Date   Acid reflux     Allergy     Arthritis     Asthma, unspecified asthma severity, unspecified whether complicated, unspecified whether persistent ([facility]CC)     DVT (deep venous thrombosis) (CMS/HHS-HCC)     Hypertension     Sleep apnea          Problem List     Patient Active Problem List  Diagnosis   Murmur, cardiac   OA (osteoarthritis) of knee   Right bundle branch block   Tinnitus aurium, bilateral   Trochanteric bursitis of right hip   Vertigo        Past Surgical History       Past Surgical History:  Procedure Laterality Date   TRACHEOSTOMY EMERGENT PEDIATRIC OVER TWO YEARS   1986   CORNEAL EYE SURGERY       KNEE ARTHROSCOPY       REPAIR FEMORAL HERNIA       REPAIR HIATAL HERNIA       REPLACEMENT TOTAL KNEE BILATERAL       SEPTOPLASTY        TONSILLECTOMY        Umbilical hernia repair 2002   Allergies      Allergies  Allergen Reactions   Oxycodone-Acetaminophen  Itching              Family History       Family History  Problem Relation Age of Onset   Heart disease Father     Diabetes Father     Cancer Father          Tobacco Use History  Social History       Tobacco Use  Smoking Status Former  Smokeless Tobacco Never        Social History  Social History        Socioeconomic History   Marital status: Married  Tobacco Use   Smoking status: Former   Smokeless tobacco: Never  Vaping Use   Vaping status: Unknown  Substance and Sexual Activity   Alcohol use: Yes   Drug use: Never    Social Drivers of Health        Housing Stability: Unknown (10/28/2023)  Housing Stability Vital Sign     Homeless in the Last Year: No        Objective:         Vitals:    10/28/23 1048  BP: 121/82  Pulse: 73  Temp: 36.6 C (97.9 F)  SpO2: 99%  Weight: 89.3 kg (196 lb 12.8 oz)  Height: 172.7 cm (5' 8)  PainSc: 0-No pain    Body mass index is 29.92 kg/m.   Physical Exam    Constitutional:  WDWN in NAD, conversant, no obvious deformities; lying in bed comfortably Eyes:  Pupils equal, round; sclera anicteric; moist conjunctiva; no lid lag HENT:  Oral mucosa moist; good dentition  Neck:  No masses palpated, trachea midline; no thyromegaly Lungs:  CTA bilaterally; normal respiratory effort CV:  Regular rate and rhythm; no murmurs; extremities well-perfused with no edema Abd:  +bowel sounds, soft, non-tender, no palpable organomegaly; no palpable hernias; healed umbilical incision GU: Bilateral descended testes, no testicular masses, no sign of left inguinal hernia, healed right inguinal incision.  There are some fullness in this area but no reducible hernia with Valsalva maneuver. Musc:  Normal; no apparent clubbing or cyanosis in extremities Lymphatic:  No palpable cervical or axillary  lymphadenopathy Skin:  Warm, dry; no sign of jaundice Psychiatric - alert and oriented x 4; calm mood and affect       Assessment and Plan:  Diagnoses and all orders for this visit:   Right groin pain     No clear evidence on physical examination of a recurrent right inguinal hernia.  This could represent a muscle strain with some resulting inflammation around the mesh attachment.  We will obtain a CT scan of the pelvis to better evaluate for possible recurrent hernia.  Further treatment will be dictated by the CT scan results.   Addendum:  CT scan shows recurrent RIH.  Will plan open repair of recurrent RIH with mesh.  The surgical procedure has been discussed with the patient.  Potential risks, benefits, alternative treatments, and expected outcomes have been explained.  All of the patient's questions at this time have been answered.  The likelihood of reaching the patient's treatment goal is good.  The patient understand the proposed surgical procedure and wishes to proceed.  Donnice POUR. Belinda, MD, [facility]  General Surgery   11/07/2023 12:09 PM

## 2023-11-17 DIAGNOSIS — R1010 Upper abdominal pain, unspecified: Secondary | ICD-10-CM | POA: Diagnosis not present

## 2023-11-17 DIAGNOSIS — N529 Male erectile dysfunction, unspecified: Secondary | ICD-10-CM | POA: Diagnosis not present

## 2023-11-17 DIAGNOSIS — I10 Essential (primary) hypertension: Secondary | ICD-10-CM | POA: Diagnosis not present

## 2023-12-06 DIAGNOSIS — G8918 Other acute postprocedural pain: Secondary | ICD-10-CM | POA: Diagnosis not present

## 2023-12-06 DIAGNOSIS — K4091 Unilateral inguinal hernia, without obstruction or gangrene, recurrent: Secondary | ICD-10-CM | POA: Diagnosis not present

## 2024-01-02 DIAGNOSIS — N5201 Erectile dysfunction due to arterial insufficiency: Secondary | ICD-10-CM | POA: Diagnosis not present

## 2024-02-27 DIAGNOSIS — R053 Chronic cough: Secondary | ICD-10-CM | POA: Diagnosis not present

## 2024-04-25 DIAGNOSIS — N5201 Erectile dysfunction due to arterial insufficiency: Secondary | ICD-10-CM | POA: Diagnosis not present

## 2024-05-24 ENCOUNTER — Ambulatory Visit: Admitting: Podiatry

## 2024-05-24 VITALS — Ht 67.5 in | Wt 185.0 lb

## 2024-05-24 DIAGNOSIS — B351 Tinea unguium: Secondary | ICD-10-CM | POA: Diagnosis not present

## 2024-05-24 DIAGNOSIS — B353 Tinea pedis: Secondary | ICD-10-CM | POA: Diagnosis not present

## 2024-05-24 MED ORDER — TERBINAFINE HCL 250 MG PO TABS: 250.0000 mg | ORAL_TABLET | Freq: Every day | ORAL | 0 refills | Status: AC

## 2024-05-24 NOTE — Progress Notes (Unsigned)
 Subjective:  Patient ID: Jesse [name], male    DOB: 09/26/1951,  MRN: [id number]  Chief Complaint  Patient presents with   Nail Problem    RM 10 Patient is here for nail fungus of the left hallux ( discoloration and thickening).  Patient is requesting a refill of the Clotrimazole -Betamethasone  1-0.05 % 1 Application Topical cream that was previously prescribed for tinea pedis.    Discussed the use of AI scribe software for clinical note transcription with the patient, who gave verbal consent to proceed.  History of Present Illness The patient presents with persistent nail fungus despite previous treatments.  He has ongoing issues with nail fungus, which is not itching, burning, or causing pain, but he describes it as 'just there.' Previous treatments with topical creams for athlete's foot and multiple laser treatments provided some improvement but did not resolve the issue completely. He reports that after some improvement, there was still dark discoloration and thickness under the nail.  He has not been on oral medication for the condition and is concerned about potential side effects and the impact on his liver, although he has no history of liver or kidney issues. He is cautious about starting new medications as he gets older.  He inquires about the possibility of alcohol affecting the treatment. He does not consume alcohol heavily, stating he is not a frequent drinker.  He also mentions a previous issue with plantar fasciitis, for which he received alpaca shots and inserts, resulting in significant improvement and no current issues.  No history of liver or kidney problems. He has not had any recent lab work, with the last being almost a year ago during a regular physical, which showed no abnormalities. He has a physical scheduled for September.      Objective:    Physical Exam VASCULAR: DP and PT pulse palpable. Foot is warm and well-perfused. Capillary fill time is  brisk. DERMATOLOGIC: Mycosis of the left hallux nail, dry scaling, peeling skin on the right foot. Normal skin turgor, texture, and temperature. No open lesions, rashes, or ulcerations. NEUROLOGIC: Normal sensation to light touch and pressure. No paresthesias on examination. ORTHOPEDIC: Smooth pain-free range of motion of all examined joints. No ecchymosis or bruising. No gross deformity. No pain to palpation.       Results    Assessment:  No diagnosis found.   Plan:  Patient was evaluated and treated and all questions answered.  Assessment and Plan Assessment & Plan Onychomycosis of left hallux nail Chronic onychomycosis with persistent thickness and discoloration despite previous topical and laser treatments. No prior use of oral antifungal medication. Oral terbinafine  (Lamisil ) discussed as the most effective treatment with an 85% success rate. Potential side effects include gastrointestinal symptoms such as nausea, cramping, and diarrhea. Nail removal discussed as an alternative treatment with a 50% chance of improvement. Emphasized importance of monitoring liver function due to hepatic metabolism of the medication. Advised moderate alcohol consumption during treatment, with occasional drinks acceptable but heavy use discouraged. - Prescribe oral terbinafine  (Lamisil ) for three months - Schedule follow-up appointment in four months to assess progress - Take photographs of the nail at each visit for comparison - Advise on moderate alcohol consumption and pause medication if heavy drinking occurs - Monitor liver function through upcoming physical exam lab work  Tinea pedis of right foot Chronic tinea pedis with dry, scaling, and peeling skin. Previous topical treatments provided limited improvement. Limited efficacy of topical treatments discussed, with a 30% success rate. Oral  terbinafine  may also aid in treating tinea pedis. - Refill topical antifungal cream for continued  use      Return in about 4 months (around 09/23/2024) for follow up after nail fungus treatment.

## 2024-05-25 MED ORDER — CLOTRIMAZOLE-BETAMETHASONE 1-0.05 % EX CREA: 1.0000 | TOPICAL_CREAM | Freq: Every day | CUTANEOUS | 0 refills | Status: AC

## 2024-06-07 DIAGNOSIS — H524 Presbyopia: Secondary | ICD-10-CM | POA: Diagnosis not present

## 2024-06-07 DIAGNOSIS — H2513 Age-related nuclear cataract, bilateral: Secondary | ICD-10-CM | POA: Diagnosis not present

## 2024-06-07 DIAGNOSIS — H25013 Cortical age-related cataract, bilateral: Secondary | ICD-10-CM | POA: Diagnosis not present

## 2024-06-07 DIAGNOSIS — H43393 Other vitreous opacities, bilateral: Secondary | ICD-10-CM | POA: Diagnosis not present

## 2024-07-03 DIAGNOSIS — Z5181 Encounter for therapeutic drug level monitoring: Secondary | ICD-10-CM | POA: Diagnosis not present

## 2024-08-20 ENCOUNTER — Other Ambulatory Visit: Payer: Self-pay | Admitting: Podiatry

## 2024-08-21 ENCOUNTER — Ambulatory Visit: Admitting: Podiatry

## 2024-08-21 VITALS — Ht 67.5 in | Wt 185.0 lb

## 2024-08-21 DIAGNOSIS — B351 Tinea unguium: Secondary | ICD-10-CM

## 2024-08-21 NOTE — Progress Notes (Signed)
  Subjective:  Patient ID: Jesse [name], male    DOB: Aug 28, 1951,  MRN: [id number]  Chief Complaint  Patient presents with   Nail Problem     Rm 4 Patient is here for painful fungal nail of left great toe. Patient is currently taking Lamisil .    Discussed the use of AI scribe software for clinical note transcription with the patient, who gave verbal consent to proceed.  History of Present Illness The patient presents with persistent nail fungus despite previous treatments.  He returns for follow-up still has very thick nail, has not had any side effects taking Lamisil  says he has been inconsistent in taking it      Objective:    Physical Exam VASCULAR: DP and PT pulse palpable. Foot is warm and well-perfused. Capillary fill time is brisk. DERMATOLOGIC: Mycosis of the left hallux nail, dry scaling, peeling skin on the right foot. Normal skin turgor, texture, and temperature. No open lesions, rashes, or ulcerations. NEUROLOGIC: Normal sensation to light touch and pressure. No paresthesias on examination. ORTHOPEDIC: Smooth pain-free range of motion of all examined joints. No ecchymosis or bruising. No gross deformity. No pain to palpation.       Results He had a metabolic panel September 2025 which showed normal renal and hepatic function   Assessment:   1. Onychomycosis      Plan:  Patient was evaluated and treated and all questions answered.  Assessment and Plan Assessment & Plan Onychomycosis of left hallux nail Has had some improvement but inconsistent use of his Lamisil  only about halfway through his course.  I recommend continuing to take it consistently for the next 6 weeks or so until he finishes the course.  I debrided the nail sharply with a nail nipper and burr to tolerable level today.  If no improvement after this would consider nail removal if he would like      No follow-ups on file.

## 2024-08-30 ENCOUNTER — Ambulatory Visit: Admitting: Podiatry

## 2024-08-30 DIAGNOSIS — I10 Essential (primary) hypertension: Secondary | ICD-10-CM | POA: Diagnosis not present

## 2024-08-30 DIAGNOSIS — E785 Hyperlipidemia, unspecified: Secondary | ICD-10-CM | POA: Diagnosis not present

## 2024-08-30 DIAGNOSIS — R059 Cough, unspecified: Secondary | ICD-10-CM | POA: Diagnosis not present

## 2024-08-30 DIAGNOSIS — Z0000 Encounter for general adult medical examination without abnormal findings: Secondary | ICD-10-CM | POA: Diagnosis not present

## 2024-08-30 DIAGNOSIS — J45909 Unspecified asthma, uncomplicated: Secondary | ICD-10-CM | POA: Diagnosis not present

## 2024-09-19 DIAGNOSIS — H0014 Chalazion left upper eyelid: Secondary | ICD-10-CM | POA: Diagnosis not present

## 2024-09-27 ENCOUNTER — Ambulatory Visit: Admitting: Podiatry

## 2024-11-20 NOTE — Therapy (Incomplete)
 " OUTPATIENT PHYSICAL THERAPY THORACOLUMBAR EVALUATION   Patient Name: Jesse [name] MRN: [id number] DOB:1951-02-17, 74 y.o., male Today's Date: 11/20/2024  END OF SESSION:   Past Medical History:  Diagnosis Date   Arthritis    knees   Asthma    seasonal   Eczema    GERD (gastroesophageal reflux disease)    HLD (hyperlipidemia)    HTN (hypertension)    Pulmonary emboli (HCC)    post op knee scope for a few months   Sleep apnea 2004   TA (temporal arteritis) (HCC)    Wears glasses    Past Surgical History:  Procedure Laterality Date   ABCESS DRAINAGE  02/22/2013   i/d-rectal   COLONOSCOPY  06/2015   No polyps, done for family history of colon polyps Next would be 2026   EYE SURGERY Left age 80   lazy eye   INGUINAL HERNIA REPAIR Right 05/25/2013   Procedure: RIGHT INGUINAL HERNIA REPAIR  WITH MESH;  Surgeon: Donnice POUR. Belinda, MD;  Location: [facility];  Service: General;  Laterality: Right;   INSERTION OF MESH Right 05/25/2013   Procedure: INSERTION OF MESH;  Surgeon: Donnice POUR. Belinda, MD;  Location: [facility];  Service: General;  Laterality: Right;   NASAL SEPTUM SURGERY  10/26/2003   TONSILLECTOMY  74 years old   TOTAL KNEE ARTHROPLASTY Right 10/25/2010   rt total knee   TOTAL KNEE ARTHROPLASTY Left 03/03/2020   Procedure: TOTAL KNEE ARTHROPLASTY;  Surgeon: Melodi Lerner, MD;  Location: [facility];  Service: Orthopedics;  Laterality: Left;    TRACHEAL SURGERY  10/26/1983   emergency trach from throat infection   UMBILICAL HERNIA REPAIR  10/26/1999   WISDOM TOOTH EXTRACTION     Patient Active Problem List   Diagnosis Date Noted   OA (osteoarthritis) of knee 03/03/2020   Murmur, cardiac 05/08/2014   RBBB 05/08/2014   Right inguinal hernia 03/06/2013   Abscess of right buttock 11/23/2012    PCP: Kip Righter, MD   REFERRING PROVIDER: Duwayne Purchase, MD  REFERRING DIAG:  Diagnosis  M54.50 (ICD-10-CM) - Low back pain,  unspecified    Rationale for Evaluation and Treatment: Rehabilitation  THERAPY DIAG:  No diagnosis found.  ONSET DATE: ***  SUBJECTIVE:                                                                                                                                                                                           SUBJECTIVE STATEMENT: ***  PERTINENT HISTORY:  OA, HTN, Hx right and left TKA  PAIN:  Are you having pain? Yes: NPRS scale: ***  Pain location: *** Pain description: *** Aggravating factors: *** Relieving factors: ***  PRECAUTIONS: {Therapy precautions:24002}  RED FLAGS: {PT Red Flags:29287}   WEIGHT BEARING RESTRICTIONS: {Yes ***/No:24003}  FALLS:  Has patient fallen in last 6 months? {fallsyesno:27318}  LIVING ENVIRONMENT: Lives with: {[facility] lives with:25569::lives with their family} Lives in: {Lives in:25570} Stairs: {opstairs:27293} Has following equipment at home: {Assistive devices:23999}  OCCUPATION: ***  PLOF: {PLOF:24004}  PATIENT GOALS: ***  NEXT MD VISIT: ***  OBJECTIVE:  Note: Objective measures were completed at Evaluation unless otherwise noted.  DIAGNOSTIC FINDINGS:  ***  PATIENT SURVEYS:  {rehab surveys:24030}  COGNITION: Overall cognitive status: {cognition:24006}     SENSATION: {sensation:27233}  MUSCLE LENGTH: Hamstrings: Right *** deg; Left *** deg Debby test: Right *** deg; Left *** deg  POSTURE: {posture:25561}  PALPATION: ***  LUMBAR ROM:   AROM eval  Flexion   Extension   Right lateral flexion   Left lateral flexion   Right rotation   Left rotation    (Blank rows = not tested)  LOWER EXTREMITY ROM:     {AROM/PROM:27142}  Right eval Left eval  Hip flexion    Hip extension    Hip abduction    Hip adduction    Hip internal rotation    Hip external rotation    Knee flexion    Knee extension    Ankle dorsiflexion    Ankle plantarflexion    Ankle inversion    Ankle eversion      (Blank rows = not tested)  LOWER EXTREMITY MMT:    MMT Right eval Left eval  Hip flexion    Hip extension    Hip abduction    Hip adduction    Hip internal rotation    Hip external rotation    Knee flexion    Knee extension    Ankle dorsiflexion    Ankle plantarflexion    Ankle inversion    Ankle eversion     (Blank rows = not tested)  LUMBAR SPECIAL TESTS:  {lumbar special test:25242}  FUNCTIONAL TESTS:  {Functional tests:24029}  GAIT: Distance walked: *** Assistive device utilized: {Assistive devices:23999} Level of assistance: {Levels of assistance:24026} Comments: ***  TREATMENT DATE: ***                                                                                                                                 PATIENT EDUCATION:  Education details: *** Person educated: {Person educated:25204} Education method: {Education Method:25205} Education comprehension: {Education Comprehension:25206}  HOME EXERCISE PROGRAM: ***  ASSESSMENT:  CLINICAL IMPRESSION: Patient is a *** y.o. *** who was seen today for physical therapy evaluation and treatment for ***.   OBJECTIVE IMPAIRMENTS: {opptimpairments:25111}.   ACTIVITY LIMITATIONS: {activitylimitations:27494}  PARTICIPATION LIMITATIONS: {participationrestrictions:25113}  PERSONAL FACTORS: {Personal factors:25162} are also affecting patient's functional outcome.   REHAB POTENTIAL: {rehabpotential:25112}  CLINICAL DECISION MAKING: {clinical decision making:25114}  EVALUATION COMPLEXITY: {Evaluation complexity:25115}   GOALS: Goals reviewed with  patient? {yes/no:20286}  SHORT TERM GOALS: Target date: ***  *** Baseline: Goal status: INITIAL  2.  *** Baseline:  Goal status: INITIAL  3.  *** Baseline:  Goal status: INITIAL  4.  *** Baseline:  Goal status: INITIAL  5.  *** Baseline:  Goal status: INITIAL  6.  *** Baseline:  Goal status: INITIAL  LONG TERM GOALS: Target date:  ***  *** Baseline:  Goal status: INITIAL  2.  *** Baseline:  Goal status: INITIAL  3.  *** Baseline:  Goal status: INITIAL  4.  *** Baseline:  Goal status: INITIAL  5.  *** Baseline:  Goal status: INITIAL  6.  *** Baseline:  Goal status: INITIAL  PLAN:  PT FREQUENCY: {rehab frequency:25116}  PT DURATION: {rehab duration:25117}  PLANNED INTERVENTIONS: {rehab planned interventions:25118::97110-Therapeutic exercises,97530- Therapeutic [phone]- Neuromuscular re-education,97535- Self Rjmz,02859- Manual therapy,Patient/Family education}.  PLAN FOR NEXT SESSION: PIERRETTE Kristeen Sar, PT 11/20/2024, 4:45 PM  "

## 2024-11-21 ENCOUNTER — Ambulatory Visit: Attending: Specialist

## 2024-11-21 ENCOUNTER — Other Ambulatory Visit: Payer: Self-pay

## 2024-11-21 DIAGNOSIS — M25551 Pain in right hip: Secondary | ICD-10-CM | POA: Insufficient documentation

## 2024-11-21 DIAGNOSIS — R262 Difficulty in walking, not elsewhere classified: Secondary | ICD-10-CM | POA: Insufficient documentation

## 2024-11-21 DIAGNOSIS — R293 Abnormal posture: Secondary | ICD-10-CM | POA: Diagnosis present

## 2024-11-21 DIAGNOSIS — R252 Cramp and spasm: Secondary | ICD-10-CM | POA: Diagnosis present

## 2024-11-21 DIAGNOSIS — M6281 Muscle weakness (generalized): Secondary | ICD-10-CM | POA: Insufficient documentation

## 2024-11-21 DIAGNOSIS — M5459 Other low back pain: Secondary | ICD-10-CM | POA: Insufficient documentation

## 2024-11-21 NOTE — Therapy (Signed)
 " OUTPATIENT PHYSICAL THERAPY THORACOLUMBAR EVALUATION   Patient Name: Jesse [name] MRN: [id number] DOB:05-May-1951, 74 y.o., male Today's Date: 11/21/2024  END OF SESSION:  PT End of Session - 11/21/24 1147     Visit Number 1    Date for Recertification  01/16/25    Authorization Type [employer]: auth requested    Progress Note Due on Visit 10    PT Start Time 1112    PT Stop Time 1150    PT Time Calculation (min) 38 min    Activity Tolerance Patient tolerated treatment well    Behavior During Therapy WFL for tasks assessed/performed          Past Medical History:  Diagnosis Date   Arthritis    knees   Asthma    seasonal   Eczema    GERD (gastroesophageal reflux disease)    HLD (hyperlipidemia)    HTN (hypertension)    Pulmonary emboli (HCC)    post op knee scope for a few months   Sleep apnea 2004   TA (temporal arteritis) (HCC)    Wears glasses    Past Surgical History:  Procedure Laterality Date   ABCESS DRAINAGE  02/22/2013   i/d-rectal   COLONOSCOPY  06/2015   No polyps, done for family history of colon polyps Next would be 2026   EYE SURGERY Left age 53   lazy eye   INGUINAL HERNIA REPAIR Right 05/25/2013   Procedure: RIGHT INGUINAL HERNIA REPAIR  WITH MESH;  Surgeon: Donnice POUR. Belinda, MD;  Location: [facility];  Service: General;  Laterality: Right;   INSERTION OF MESH Right 05/25/2013   Procedure: INSERTION OF MESH;  Surgeon: Donnice POUR. Belinda, MD;  Location: [facility];  Service: General;  Laterality: Right;   NASAL SEPTUM SURGERY  10/26/2003   TONSILLECTOMY  74 years old   TOTAL KNEE ARTHROPLASTY Right 10/25/2010   rt total knee   TOTAL KNEE ARTHROPLASTY Left 03/03/2020   Procedure: TOTAL KNEE ARTHROPLASTY;  Surgeon: Melodi Lerner, MD;  Location: [facility];  Service: Orthopedics;  Laterality: Left;    TRACHEAL SURGERY  10/26/1983   emergency trach from throat infection   UMBILICAL HERNIA REPAIR  10/26/1999   WISDOM  TOOTH EXTRACTION     Patient Active Problem List   Diagnosis Date Noted   OA (osteoarthritis) of knee 03/03/2020   Murmur, cardiac 05/08/2014   RBBB 05/08/2014   Right inguinal hernia 03/06/2013   Abscess of right buttock 11/23/2012    PCP: Kip Righter, MD  REFERRING PROVIDER: Duwayne Purchase, MD  REFERRING DIAG:  Diagnosis  M54.50 (ICD-10-CM) - Low back pain, unspecified    Rationale for Evaluation and Treatment: Rehabilitation  THERAPY DIAG:  Cramp and spasm - Plan: PT plan of care cert/re-cert  Pain in right hip - Plan: PT plan of care cert/re-cert  Other low back pain - Plan: PT plan of care cert/re-cert  Muscle weakness (generalized) - Plan: PT plan of care cert/re-cert  ONSET DATE: 08/2024  SUBJECTIVE:  SUBJECTIVE STATEMENT: I started having Rt glute/lateral hip pain ~2 months ago.  I had an x-ray and Dr Duwayne said I have hip bursitis.  I took Prednisone  and it helped while I was taking it.  Started working out in September and lost 12 lbs.  Still going to the gym and stretches hip hip regularly.   PERTINENT HISTORY:  Lipoma right lateral trunk; right TKR   PAIN: 11/21/24 Are you having pain? Yes: NPRS scale: 0-3/10 Pain location: Rt glutes, trochanteric bursa and radiates down the leg to the knee Pain description: intermittent, sore, aching Aggravating factors: standing/walking, when standing up after sitting, heavy lifting  Relieving factors: stretching, when sitting, once get walking it is OK  PRECAUTIONS: None  RED FLAGS: None   WEIGHT BEARING RESTRICTIONS: No  FALLS:  Has patient fallen in last 6 months? No  LIVING ENVIRONMENT: Lives with: lives with their spouse Lives in: House/apartment   PLOF: Independent and Leisure: exercises at the gym 3x/wk  PATIENT  GOALS: reduce pain in hip  NEXT MD VISIT: after PT  OBJECTIVE:  Note: Objective measures were completed at Evaluation unless otherwise noted.  DIAGNOSTIC FINDINGS:  11/02/24: CT of pelvis:  IMPRESSION: 1. Redemonstrated mesenteric fat containing right-sided indirect inguinal hernia measuring 5.7 x 3.4 x 2.8 cm, grossly unchanged compared to remote abdominal CT performed in 2014. 2. Scattered colonic diverticulosis without evidence of superimposed acute diverticulitis. 3. Prostatomegaly. 4. Aortic Atherosclerosis (ICD10-I70.0). 5. 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  PATIENT SURVEYS:  11/21/24: Modified Oswestry: 14/50=28% limitation   COGNITION: Overall cognitive status: Within functional limits for tasks assessed     SENSATION: WFL  MUSCLE LENGTH: Limited by 25-50% bil hips   POSTURE: forward head and flexed trunk   PALPATION: Palpable tenderness over Rt lateral hamstrings and gluteals adjacent to greater trochanter  LUMBAR ROM:  WFLs without pain    LOWER EXTREMITY MMT:    Rt hip 4/5, Lt hip 4+/5, knees bil 5/5  LUMBAR SPECIAL TESTS:  Straight leg raise test: Negative   GAIT: Distance walked: 50 Assistive device utilized: None Level of assistance: Complete Independence Comments: flexed trunk   TREATMENT DATE:  11/21/24:  Findings from evaluation discussed, pt educated on plan of care, HEP initiated.                                                                                                                                 PATIENT EDUCATION:  Education details: Access Code: [phone] Person educated: Patient Education method: Explanation, Demonstration, and Handouts Education comprehension: verbalized understanding and returned demonstration  HOME EXERCISE PROGRAM: Access Code: [email or web address] URL: [email or web address] Date: 11/21/2024 Prepared by: Burnard  Exercises - Seated Figure 4 Piriformis Stretch  - 3 x daily - 7 x weekly - 1  sets - 3 reps - 20 hold - Seated Hamstring Stretch  - 3 x daily - 7 x weekly - 1 sets - 3 reps -  20 hold - Hip Flexor Stretch on Step  - 3 x daily - 7 x weekly - 1 sets - 3 reps - 20 hold  ASSESSMENT:  CLINICAL IMPRESSION: Patient is a 74 y.o. male who was seen today for physical therapy evaluation and treatment for Rt gluteal and hip pain.  Pt reports that pain began ~2 months ago without cause.  Pt had x-ray at the [facility] that was negative.  He took a 6 day dose pack of steroids and felt better while taking it.  Pain has now returned.  He reports 3/10 Rt hip and gluteal pain with stand to sit to transition and with lifting heavy objects.  Pain resolves with walking for a short time.  Pt with reduced hip flexibility bil and mild weakness in Rt hip vs Lt.  Palpable tenderness over Rt lateral hamstrings, proximal gluteals and locally to Rt greater trochanter.  Pt with flexed trunk and shortened hip flexors in standing.  Patient will benefit from skilled PT to address the below impairments and improve overall function.   OBJECTIVE IMPAIRMENTS: decreased activity tolerance, decreased ROM, increased fascial restrictions, increased muscle spasms, impaired flexibility, and pain.   ACTIVITY LIMITATIONS: carrying, lifting, sitting, standing, and locomotion level  PARTICIPATION LIMITATIONS: driving and community activity  PERSONAL FACTORS: Time since onset of injury/illness/exacerbation and 1 comorbidity: Rt TKA are also affecting patient's functional outcome.   REHAB POTENTIAL: Good  CLINICAL DECISION MAKING: Stable/uncomplicated  EVALUATION COMPLEXITY: Low   GOALS: Goals reviewed with patient? Yes  SHORT TERM GOALS: Target date: 12/19/2024    Be independent in initial HEP Baseline: Goal status: INITIAL  2.  Report > or = to 30% reduction in frequency and intensity Rt hip/gluteal pain after sitting  Baseline:  Goal status: INITIAL  3.  Verbalize and demonstrate body mechanics modifications  for lumbar protection with lifting objects for daily tasks  Baseline:  Goal status: INITIAL    LONG TERM GOALS: Target date: 01/16/2025    Be independent in advanced HEP Baseline:  Goal status: INITIAL  2.  Improve Modified Oswestry to < or = to 8/50 (16% disability) Baseline: 14/50=28%  Goal status: INITIAL  3.  Lift and carry his guitar amp without limitation due to Rt hip pain Baseline:  Goal status: INITIAL  4.  Report > or = to 70% reduction in frequency and intensity Rt hip/gluteal pain after sitting  Baseline:  Goal status: INITIAL    PLAN:  PT FREQUENCY: 2x/week  PT DURATION: 8 weeks  PLANNED INTERVENTIONS: 97110-Therapeutic exercises, 97530- Therapeutic activity, 97112- Neuromuscular re-education, 97535- Self Care, 02859- Manual therapy, [phone]- Gait training, [phone]- Canalith repositioning, [id number]- Aquatic Therapy, [phone]- Ionotophoresis 4mg /ml Dexamethasone , 79439 (1-2 muscles), 20561 (3+ muscles)- Dry Needling, Patient/Family education, Balance training, Stair training, Taping, Joint mobilization, Joint manipulation, Spinal manipulation, Spinal mobilization, Vestibular training, Cryotherapy, and Moist heat.  PLAN FOR NEXT SESSION: review HEP, Rt hip flexibility, lumbar flexibility, Dry needling to Rt lateral hamstrings and gluteals (PT discussed cost and issued handout)   Burnard Joy, PT 11/21/24 1:24 PM   [facility] [address] [place], [place] [address] Phone # [phone] Fax [phone]  "

## 2024-11-21 NOTE — Patient Instructions (Signed)

## 2024-11-22 ENCOUNTER — Ambulatory Visit: Admitting: Podiatry

## 2024-11-23 ENCOUNTER — Ambulatory Visit

## 2024-11-23 DIAGNOSIS — R252 Cramp and spasm: Secondary | ICD-10-CM

## 2024-11-23 DIAGNOSIS — R262 Difficulty in walking, not elsewhere classified: Secondary | ICD-10-CM

## 2024-11-23 DIAGNOSIS — M25551 Pain in right hip: Secondary | ICD-10-CM

## 2024-11-23 DIAGNOSIS — M5459 Other low back pain: Secondary | ICD-10-CM

## 2024-11-23 DIAGNOSIS — R293 Abnormal posture: Secondary | ICD-10-CM

## 2024-11-23 DIAGNOSIS — M6281 Muscle weakness (generalized): Secondary | ICD-10-CM

## 2024-11-23 NOTE — Therapy (Unsigned)
 " OUTPATIENT PHYSICAL THERAPY THORACOLUMBAR EVALUATION   Patient Name: Jesse [name] MRN: [id number] DOB:12-Oct-1951, 74 y.o., male Today's Date: 11/23/2024  END OF SESSION:  PT End of Session - 11/23/24 0935     Visit Number 2    Number of Visits 16    Date for Recertification  01/16/25    Authorization Type [employer] Approved 16 vl-11/21/24-01/16/25-auth#[id number] [employer] 2026    Progress Note Due on Visit 10    PT Start Time 0935    Activity Tolerance Patient tolerated treatment well    Behavior During Therapy [facility] for tasks assessed/performed          Past Medical History:  Diagnosis Date   Arthritis    knees   Asthma    seasonal   Eczema    GERD (gastroesophageal reflux disease)    HLD (hyperlipidemia)    HTN (hypertension)    Pulmonary emboli (HCC)    post op knee scope for a few months   Sleep apnea 2004   TA (temporal arteritis) (HCC)    Wears glasses    Past Surgical History:  Procedure Laterality Date   ABCESS DRAINAGE  02/22/2013   i/d-rectal   COLONOSCOPY  06/2015   No polyps, done for family history of colon polyps Next would be 2026   EYE SURGERY Left age 57   lazy eye   INGUINAL HERNIA REPAIR Right 05/25/2013   Procedure: RIGHT INGUINAL HERNIA REPAIR  WITH MESH;  Surgeon: Donnice POUR. Belinda, MD;  Location: [facility];  Service: General;  Laterality: Right;   INSERTION OF MESH Right 05/25/2013   Procedure: INSERTION OF MESH;  Surgeon: Donnice POUR. Belinda, MD;  Location: [facility];  Service: General;  Laterality: Right;   NASAL SEPTUM SURGERY  10/26/2003   TONSILLECTOMY  74 years old   TOTAL KNEE ARTHROPLASTY Right 10/25/2010   rt total knee   TOTAL KNEE ARTHROPLASTY Left 03/03/2020   Procedure: TOTAL KNEE ARTHROPLASTY;  Surgeon: Melodi Lerner, MD;  Location: [facility];  Service: Orthopedics;  Laterality: Left;    TRACHEAL SURGERY  10/26/1983   emergency trach from throat infection   UMBILICAL HERNIA REPAIR   10/26/1999   WISDOM TOOTH EXTRACTION     Patient Active Problem List   Diagnosis Date Noted   OA (osteoarthritis) of knee 03/03/2020   Murmur, cardiac 05/08/2014   RBBB 05/08/2014   Right inguinal hernia 03/06/2013   Abscess of right buttock 11/23/2012    PCP: Kip Righter, MD  REFERRING PROVIDER: Duwayne Purchase, MD  REFERRING DIAG:  Diagnosis  M54.50 (ICD-10-CM) - Low back pain, unspecified    Rationale for Evaluation and Treatment: Rehabilitation  THERAPY DIAG:  Cramp and spasm  Pain in right hip  Other low back pain  Muscle weakness (generalized)  Abnormal posture  Difficulty in walking, not elsewhere classified  ONSET DATE: 08/2024  SUBJECTIVE:  SUBJECTIVE STATEMENT: Patient reports he is about the same.  Pain level 5/10.  No questions about HEP.    Initial eval subjective: I started having Rt glute/lateral hip pain ~2 months ago.  I had an x-ray and Dr Duwayne said I have hip bursitis.  I took Prednisone  and it helped while I was taking it.  Started working out in September and lost 12 lbs.  Still going to the gym and stretches hip hip regularly.   PERTINENT HISTORY:  Lipoma right lateral trunk; right TKR   PAIN: 11/23/24 Are you having pain? Yes: NPRS scale: 5/10 Pain location: Rt glutes, trochanteric bursa and radiates down the leg to the knee Pain description: intermittent, sore, aching Aggravating factors: standing/walking, when standing up after sitting, heavy lifting  Relieving factors: stretching, when sitting, once get walking it is OK  PRECAUTIONS: None  RED FLAGS: None   WEIGHT BEARING RESTRICTIONS: No  FALLS:  Has patient fallen in last 6 months? No  LIVING ENVIRONMENT: Lives with: lives with their spouse Lives in: House/apartment   PLOF: Independent  and Leisure: exercises at the gym 3x/wk  PATIENT GOALS: reduce pain in hip  NEXT MD VISIT: after PT  OBJECTIVE:  Note: Objective measures were completed at Evaluation unless otherwise noted.  DIAGNOSTIC FINDINGS:  11/02/24: CT of pelvis:  IMPRESSION: 1. Redemonstrated mesenteric fat containing right-sided indirect inguinal hernia measuring 5.7 x 3.4 x 2.8 cm, grossly unchanged compared to remote abdominal CT performed in 2014. 2. Scattered colonic diverticulosis without evidence of superimposed acute diverticulitis. 3. Prostatomegaly. 4. Aortic Atherosclerosis (ICD10-I70.0). 5. 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  PATIENT SURVEYS:  11/21/24: Modified Oswestry: 14/50=28% limitation   COGNITION: Overall cognitive status: Within functional limits for tasks assessed     SENSATION: WFL  MUSCLE LENGTH: Limited by 25-50% bil hips   POSTURE: forward head and flexed trunk   PALPATION: Palpable tenderness over Rt lateral hamstrings and gluteals adjacent to greater trochanter  LUMBAR ROM:  WFLs without pain    LOWER EXTREMITY MMT:    Rt hip 4/5, Lt hip 4+/5, knees bil 5/5  LUMBAR SPECIAL TESTS:  Straight leg raise test: Negative   GAIT: Distance walked: 50 Assistive device utilized: None Level of assistance: Complete Independence Comments: flexed trunk   TREATMENT DATE:  11/23/24:  Nustep x 5 min level 4 IT band stretching 5 x 10 sec with knee bent then 5 x 10 sec with knee straight right IT band rolling with pink foam roller 2 x 30 sec right Hip IR/ER combo x 5 each side, 10 sec hold Bridging 2 x 10 with isometric ball squeeze using purple ball Hook lying clam 2 x 10 with yellow loop Side lying clam 2 x 10 no resistance right Reverse clam 2 x 10 no resistance right Lateral band walks with light green loop 3 laps of 10 steps Ice to right hip x 10 min seated in arm chair with pillow to approximate ice to desired area   11/21/24:  Findings from evaluation  discussed, pt educated on plan of care, HEP initiated.  PATIENT EDUCATION:  Education details: Access Code: [phone] Person educated: Patient Education method: Explanation, Demonstration, and Handouts Education comprehension: verbalized understanding and returned demonstration  HOME EXERCISE PROGRAM: Access Code: [email or web address] URL: [email or web address] Date: 11/21/2024 Prepared by: Burnard  Exercises - Seated Figure 4 Piriformis Stretch  - 3 x daily - 7 x weekly - 1 sets - 3 reps - 20 hold - Seated Hamstring Stretch  - 3 x daily - 7 x weekly - 1 sets - 3 reps - 20 hold - Hip Flexor Stretch on Step  - 3 x daily - 7 x weekly - 1 sets - 3 reps - 20 hold  ASSESSMENT:  CLINICAL IMPRESSION: Mr. Diemer was able to complete several tasks with minimal pain.  He lack flexibility throughout the hip and appears to be quite weak in glut medius.  We added ice to calm the inflammation and suggested he do this at home as well.   Patient will benefit from skilled PT to address the below impairments and improve overall function.   OBJECTIVE IMPAIRMENTS: decreased activity tolerance, decreased ROM, increased fascial restrictions, increased muscle spasms, impaired flexibility, and pain.   ACTIVITY LIMITATIONS: carrying, lifting, sitting, standing, and locomotion level  PARTICIPATION LIMITATIONS: driving and community activity  PERSONAL FACTORS: Time since onset of injury/illness/exacerbation and 1 comorbidity: Rt TKA are also affecting patient's functional outcome.   REHAB POTENTIAL: Good  CLINICAL DECISION MAKING: Stable/uncomplicated  EVALUATION COMPLEXITY: Low   GOALS: Goals reviewed with patient? Yes  SHORT TERM GOALS: Target date: 12/19/2024    Be independent in initial HEP Baseline: Goal status: INITIAL  2.  Report > or = to 30% reduction in  frequency and intensity Rt hip/gluteal pain after sitting  Baseline:  Goal status: INITIAL  3.  Verbalize and demonstrate body mechanics modifications for lumbar protection with lifting objects for daily tasks  Baseline:  Goal status: INITIAL    LONG TERM GOALS: Target date: 01/16/2025    Be independent in advanced HEP Baseline:  Goal status: INITIAL  2.  Improve Modified Oswestry to < or = to 8/50 (16% disability) Baseline: 14/50=28%  Goal status: INITIAL  3.  Lift and carry his guitar amp without limitation due to Rt hip pain Baseline:  Goal status: INITIAL  4.  Report > or = to 70% reduction in frequency and intensity Rt hip/gluteal pain after sitting  Baseline:  Goal status: INITIAL    PLAN:  PT FREQUENCY: 2x/week  PT DURATION: 8 weeks  PLANNED INTERVENTIONS: 97110-Therapeutic exercises, 97530- Therapeutic activity, 97112- Neuromuscular re-education, 97535- Self Care, 02859- Manual therapy, [phone]- Gait training, [phone]- Canalith repositioning, [id number]- Aquatic Therapy, [phone]- Ionotophoresis 4mg /ml Dexamethasone , 79439 (1-2 muscles), [id number] (3+ muscles)- Dry Needling, Patient/Family education, Balance training, Stair training, Taping, Joint mobilization, Joint manipulation, Spinal manipulation, Spinal mobilization, Vestibular training, Cryotherapy, and Moist heat.  PLAN FOR NEXT SESSION: Nustep, Rt hip flexibility, lumbar flexibility, Dry needling to Rt lateral hamstrings and gluteals (PT discussed cost and issued handout)   Delon B. Shakyra Mattera, PT 11/23/24 10:20 AM [facility] [address] [place], [place] [address] Phone # [phone] Fax [phone]  "

## 2024-11-28 ENCOUNTER — Ambulatory Visit

## 2024-11-28 DIAGNOSIS — M25551 Pain in right hip: Secondary | ICD-10-CM

## 2024-11-28 DIAGNOSIS — M6281 Muscle weakness (generalized): Secondary | ICD-10-CM

## 2024-11-28 DIAGNOSIS — R293 Abnormal posture: Secondary | ICD-10-CM

## 2024-11-28 DIAGNOSIS — R262 Difficulty in walking, not elsewhere classified: Secondary | ICD-10-CM

## 2024-11-28 DIAGNOSIS — M5459 Other low back pain: Secondary | ICD-10-CM

## 2024-11-28 DIAGNOSIS — R252 Cramp and spasm: Secondary | ICD-10-CM

## 2024-11-28 NOTE — Therapy (Signed)
 " OUTPATIENT PHYSICAL THERAPY THORACOLUMBAR EVALUATION   Patient Name: Jesse [name] MRN: [id number] DOB:1951/06/02, 74 y.o., male Today's Date: 11/28/2024  END OF SESSION:  PT End of Session - 11/28/24 1012     Visit Number 3    Date for Recertification  01/16/25    Authorization Type [employer] Approved 16 vl-11/21/24-01/16/25-auth#[id number] [employer] 2026    Progress Note Due on Visit 10    PT Start Time 0931    PT Stop Time 1013    PT Time Calculation (min) 42 min    Activity Tolerance Patient tolerated treatment well    Behavior During Therapy WFL for tasks assessed/performed           Past Medical History:  Diagnosis Date   Arthritis    knees   Asthma    seasonal   Eczema    GERD (gastroesophageal reflux disease)    HLD (hyperlipidemia)    HTN (hypertension)    Pulmonary emboli (HCC)    post op knee scope for a few months   Sleep apnea 2004   TA (temporal arteritis) (HCC)    Wears glasses    Past Surgical History:  Procedure Laterality Date   ABCESS DRAINAGE  02/22/2013   i/d-rectal   COLONOSCOPY  06/2015   No polyps, done for family history of colon polyps Next would be 2026   EYE SURGERY Left age 34   lazy eye   INGUINAL HERNIA REPAIR Right 05/25/2013   Procedure: RIGHT INGUINAL HERNIA REPAIR  WITH MESH;  Surgeon: Donnice POUR. Belinda, MD;  Location: [facility];  Service: General;  Laterality: Right;   INSERTION OF MESH Right 05/25/2013   Procedure: INSERTION OF MESH;  Surgeon: Donnice POUR. Belinda, MD;  Location: [facility];  Service: General;  Laterality: Right;   NASAL SEPTUM SURGERY  10/26/2003   TONSILLECTOMY  74 years old   TOTAL KNEE ARTHROPLASTY Right 10/25/2010   rt total knee   TOTAL KNEE ARTHROPLASTY Left 03/03/2020   Procedure: TOTAL KNEE ARTHROPLASTY;  Surgeon: Melodi Lerner, MD;  Location: [facility];  Service: Orthopedics;  Laterality: Left;    TRACHEAL SURGERY  10/26/1983   emergency trach from throat  infection   UMBILICAL HERNIA REPAIR  10/26/1999   WISDOM TOOTH EXTRACTION     Patient Active Problem List   Diagnosis Date Noted   OA (osteoarthritis) of knee 03/03/2020   Murmur, cardiac 05/08/2014   RBBB 05/08/2014   Right inguinal hernia 03/06/2013   Abscess of right buttock 11/23/2012    PCP: Kip Righter, MD  REFERRING PROVIDER: Duwayne Purchase, MD  REFERRING DIAG:  Diagnosis  M54.50 (ICD-10-CM) - Low back pain, unspecified    Rationale for Evaluation and Treatment: Rehabilitation  THERAPY DIAG:  Cramp and spasm  Pain in right hip  Other low back pain  Muscle weakness (generalized)  Abnormal posture  Difficulty in walking, not elsewhere classified  ONSET DATE: 08/2024  SUBJECTIVE:  SUBJECTIVE STATEMENT: I've been better.  Rt>Lt quad pain.  It is always the worst in the morning.     Initial eval subjective: I started having Rt glute/lateral hip pain ~2 months ago.  I had an x-ray and Dr Duwayne said I have hip bursitis.  I took Prednisone  and it helped while I was taking it.  Started working out in September and lost 12 lbs.  Still going to the gym and stretches hip hip regularly.   PERTINENT HISTORY:  Lipoma right lateral trunk; right TKR   PAIN: 11/28/24 Are you having pain? Yes: NPRS scale: 6/10 Pain location: Rt glutes, trochanteric bursa and radiates down the leg to the knee Pain description: intermittent, sore, aching Aggravating factors: standing/walking, when standing up after sitting, heavy lifting, morning Relieving factors: stretching, when sitting, once get walking it is OK  PRECAUTIONS: None  RED FLAGS: None   WEIGHT BEARING RESTRICTIONS: No  FALLS:  Has patient fallen in last 6 months? No  LIVING ENVIRONMENT: Lives with: lives with their spouse Lives  in: House/apartment   PLOF: Independent and Leisure: exercises at the gym 3x/wk  PATIENT GOALS: reduce pain in hip  NEXT MD VISIT: after PT  OBJECTIVE:  Note: Objective measures were completed at Evaluation unless otherwise noted.  DIAGNOSTIC FINDINGS:  11/02/24: CT of pelvis:  IMPRESSION: 1. Redemonstrated mesenteric fat containing right-sided indirect inguinal hernia measuring 5.7 x 3.4 x 2.8 cm, grossly unchanged compared to remote abdominal CT performed in 2014. 2. Scattered colonic diverticulosis without evidence of superimposed acute diverticulitis. 3. Prostatomegaly. 4. Aortic Atherosclerosis (ICD10-I70.0). 5. 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  PATIENT SURVEYS:  11/21/24: Modified Oswestry: 14/50=28% limitation   COGNITION: Overall cognitive status: Within functional limits for tasks assessed     SENSATION: WFL  MUSCLE LENGTH: Limited by 25-50% bil hips   POSTURE: forward head and flexed trunk   PALPATION: Palpable tenderness over Rt lateral hamstrings and gluteals adjacent to greater trochanter  LUMBAR ROM:  WFLs without pain    LOWER EXTREMITY MMT:    Rt hip 4/5, Lt hip 4+/5, knees bil 5/5  LUMBAR SPECIAL TESTS:  Straight leg raise test: Negative   GAIT: Distance walked: 50 Assistive device utilized: None Level of assistance: Complete Independence Comments: flexed trunk   TREATMENT DATE:  11/28/24:  Nustep x 6 min level 5- PT present to discuss progress  Standing quad stretch using chair at steps 2x20 seconds   Hip IR/ER combo x 5 each side, 10 sec hold Bridging 2 x 10 with isometric ball squeeze using purple ball Hook lying clam 2 x 10 with yellow loop Side lying clam 2 x 10 no resistance right Reverse clam 2 x 10 no resistance right Lateral band walks with light green loop 3 laps of 10 steps Ice to right hip x 10 min seated in arm chair with pillow to approximate ice to desired area    11/23/24:  Nustep x 5 min level 4 IT band  stretching 5 x 10 sec with knee bent then 5 x 10 sec with knee straight right IT band rolling with pink foam roller 2 x 30 sec right Hip IR/ER combo x 5 each side, 10 sec hold Bridging 2 x 10 with isometric ball squeeze using purple ball Hook lying clam 2 x 10 with yellow loop Side lying clam 2 x 10 no resistance right Reverse clam 2 x 10 no resistance right Lateral band walks with light green loop 3 laps of 10  steps Ice to right hip x 10 min seated in arm chair with pillow to approximate ice to desired area   11/21/24:  Findings from evaluation discussed, pt educated on plan of care, HEP initiated.                                                                                                                                 PATIENT EDUCATION:  Education details: Access Code: [phone] Person educated: Patient Education method: Explanation, Demonstration, and Handouts Education comprehension: verbalized understanding and returned demonstration  HOME EXERCISE PROGRAM: Access Code: [email or web address] Access Code: [email or web address] URL: [email or web address] Date: 11/28/2024 Prepared by: Burnard  Exercises - Seated Figure 4 Piriformis Stretch  - 3 x daily - 7 x weekly - 1 sets - 3 reps - 20 hold - Seated Hamstring Stretch  - 3 x daily - 7 x weekly - 1 sets - 3 reps - 20 hold - Hip Flexor Stretch on Step  - 3 x daily - 7 x weekly - 1 sets - 3 reps - 20 hold - Supine Hip Internal and External Rotation  - 2 x daily - 7 x weekly - 1 sets - 10 reps - Clamshell  - 1 x daily - 7 x weekly - 1-2 sets - 10 reps - Sidelying Reverse Clamshell  - 1 x daily - 7 x weekly - 1-2 sets - 10 reps - Hooklying Clamshell with Resistance  - 2 x daily - 7 x weekly - 1 sets - 10 reps - Supine Bridge with Mini Swiss Ball Between Knees  - 1 x daily - 7 x weekly - 1-2 sets - 10 reps - 5 hold ASSESSMENT:  CLINICAL IMPRESSION: Mr. Baines arrived with increased pain as he reports that he is worse in the morning due to  stiffness.  He lack flexibility throughout the hip and appears to be quite weak in glut medius. Exercises focused to address this today during session. He tolerated all exercises well today and PT provided supervision and cueing for form.   Pain was reduced to 4/10 at the end of session. He didn't want to try dry needling just yet.   Patient will benefit from skilled PT to address the below impairments and improve overall function.   OBJECTIVE IMPAIRMENTS: decreased activity tolerance, decreased ROM, increased fascial restrictions, increased muscle spasms, impaired flexibility, and pain.   ACTIVITY LIMITATIONS: carrying, lifting, sitting, standing, and locomotion level  PARTICIPATION LIMITATIONS: driving and community activity  PERSONAL FACTORS: Time since onset of injury/illness/exacerbation and 1 comorbidity: Rt TKA are also affecting patient's functional outcome.   REHAB POTENTIAL: Good  CLINICAL DECISION MAKING: Stable/uncomplicated  EVALUATION COMPLEXITY: Low   GOALS: Goals reviewed with patient? Yes  SHORT TERM GOALS: Target date: 12/19/2024    Be independent in initial HEP Baseline: added strength today (11/28/24) Goal status: In progress   2.  Report > or = to 30% reduction in frequency and intensity Rt  hip/gluteal pain after sitting  Baseline:  Goal status: INITIAL  3.  Verbalize and demonstrate body mechanics modifications for lumbar protection with lifting objects for daily tasks  Baseline:  Goal status: INITIAL    LONG TERM GOALS: Target date: 01/16/2025    Be independent in advanced HEP Baseline:  Goal status: INITIAL  2.  Improve Modified Oswestry to < or = to 8/50 (16% disability) Baseline: 14/50=28%  Goal status: INITIAL  3.  Lift and carry his guitar amp without limitation due to Rt hip pain Baseline:  Goal status: INITIAL  4.  Report > or = to 70% reduction in frequency and intensity Rt hip/gluteal pain after sitting  Baseline:  Goal status:  INITIAL    PLAN:  PT FREQUENCY: 2x/week  PT DURATION: 8 weeks  PLANNED INTERVENTIONS: 97110-Therapeutic exercises, 97530- Therapeutic activity, 97112- Neuromuscular re-education, 97535- Self Care, 02859- Manual therapy, [phone]- Gait training, [phone]- Canalith repositioning, [id number]- Aquatic Therapy, [phone]- Ionotophoresis 4mg /ml Dexamethasone , 79439 (1-2 muscles), [phone] (3+ muscles)- Dry Needling, Patient/Family education, Balance training, Stair training, Taping, Joint mobilization, Joint manipulation, Spinal manipulation, Spinal mobilization, Vestibular training, Cryotherapy, and Moist heat.  PLAN FOR NEXT SESSION: Nustep, Rt hip flexibility, lumbar flexibility, Dry needling to Rt lateral hamstrings and gluteals (PT discussed cost and issued handout)   Burnard Joy, PT 11/28/24 10:26 AM  [facility] [address] [place], [place] [address] Phone # [phone] Fax [phone]  "

## 2024-11-30 ENCOUNTER — Encounter: Payer: Self-pay | Admitting: Physical Therapy

## 2024-11-30 ENCOUNTER — Ambulatory Visit: Admitting: Physical Therapy

## 2024-11-30 DIAGNOSIS — R293 Abnormal posture: Secondary | ICD-10-CM

## 2024-11-30 DIAGNOSIS — R252 Cramp and spasm: Secondary | ICD-10-CM

## 2024-11-30 DIAGNOSIS — M25551 Pain in right hip: Secondary | ICD-10-CM

## 2024-11-30 DIAGNOSIS — R262 Difficulty in walking, not elsewhere classified: Secondary | ICD-10-CM

## 2024-11-30 DIAGNOSIS — M6281 Muscle weakness (generalized): Secondary | ICD-10-CM

## 2024-11-30 DIAGNOSIS — M5459 Other low back pain: Secondary | ICD-10-CM

## 2024-11-30 NOTE — Therapy (Signed)
 " OUTPATIENT PHYSICAL THERAPY THORACOLUMBAR TREATMENT   Patient Name: Jesse [name] MRN: [id number] DOB:1951-05-15, 74 y.o., male Today's Date: 11/30/2024  END OF SESSION:  PT End of Session - 11/30/24 1047     Visit Number 4    Number of Visits 16    Date for Recertification  01/16/25    Authorization Type [employer] Approved 16 vl-11/21/24-01/16/25-auth#[id number] [employer] 2026    Authorization - Visit Number 4    Authorization - Number of Visits 16    Progress Note Due on Visit 10    PT Start Time 0933    PT Stop Time 1027    PT Time Calculation (min) 54 min    Activity Tolerance Patient tolerated treatment well    Behavior During Therapy WFL for tasks assessed/performed            Past Medical History:  Diagnosis Date   Arthritis    knees   Asthma    seasonal   Eczema    GERD (gastroesophageal reflux disease)    HLD (hyperlipidemia)    HTN (hypertension)    Pulmonary emboli (HCC)    post op knee scope for a few months   Sleep apnea 2004   TA (temporal arteritis) (HCC)    Wears glasses    Past Surgical History:  Procedure Laterality Date   ABCESS DRAINAGE  02/22/2013   i/d-rectal   COLONOSCOPY  06/2015   No polyps, done for family history of colon polyps Next would be 2026   EYE SURGERY Left age 64   lazy eye   INGUINAL HERNIA REPAIR Right 05/25/2013   Procedure: RIGHT INGUINAL HERNIA REPAIR  WITH MESH;  Surgeon: Donnice POUR. Belinda, MD;  Location: [facility];  Service: General;  Laterality: Right;   INSERTION OF MESH Right 05/25/2013   Procedure: INSERTION OF MESH;  Surgeon: Donnice POUR. Belinda, MD;  Location: [facility];  Service: General;  Laterality: Right;   NASAL SEPTUM SURGERY  10/26/2003   TONSILLECTOMY  74 years old   TOTAL KNEE ARTHROPLASTY Right 10/25/2010   rt total knee   TOTAL KNEE ARTHROPLASTY Left 03/03/2020   Procedure: TOTAL KNEE ARTHROPLASTY;  Surgeon: Melodi Lerner, MD;  Location: [facility];  Service:  Orthopedics;  Laterality: Left;    TRACHEAL SURGERY  10/26/1983   emergency trach from throat infection   UMBILICAL HERNIA REPAIR  10/26/1999   WISDOM TOOTH EXTRACTION     Patient Active Problem List   Diagnosis Date Noted   OA (osteoarthritis) of knee 03/03/2020   Murmur, cardiac 05/08/2014   RBBB 05/08/2014   Right inguinal hernia 03/06/2013   Abscess of right buttock 11/23/2012    PCP: Kip Righter, MD  REFERRING PROVIDER: Duwayne Purchase, MD  REFERRING DIAG:  Diagnosis  M54.50 (ICD-10-CM) - Low back pain, unspecified    Rationale for Evaluation and Treatment: Rehabilitation  THERAPY DIAG:  Cramp and spasm  Pain in right hip  Other low back pain  Muscle weakness (generalized)  Abnormal posture  Difficulty in walking, not elsewhere classified  ONSET DATE: 08/2024  SUBJECTIVE:  SUBJECTIVE STATEMENT: Patient reports he is doing okay today. His hips felt stiff this morning but they are doing better currently. He felt good after last session.    Initial eval subjective: I started having Rt glute/lateral hip pain ~2 months ago.  I had an x-ray and Dr Duwayne said I have hip bursitis.  I took Prednisone  and it helped while I was taking it.  Started working out in September and lost 12 lbs.  Still going to the gym and stretches hip hip regularly.   PERTINENT HISTORY:  Lipoma right lateral trunk; right TKR   PAIN: 11/28/24 Are you having pain? Yes: NPRS scale: 6/10 Pain location: Rt glutes, trochanteric bursa and radiates down the leg to the knee Pain description: intermittent, sore, aching Aggravating factors: standing/walking, when standing up after sitting, heavy lifting, morning Relieving factors: stretching, when sitting, once get walking it is OK  PRECAUTIONS: None  RED  FLAGS: None   WEIGHT BEARING RESTRICTIONS: No  FALLS:  Has patient fallen in last 6 months? No  LIVING ENVIRONMENT: Lives with: lives with their spouse Lives in: House/apartment   PLOF: Independent and Leisure: exercises at the gym 3x/wk  PATIENT GOALS: reduce pain in hip  NEXT MD VISIT: after PT  OBJECTIVE:  Note: Objective measures were completed at Evaluation unless otherwise noted.  DIAGNOSTIC FINDINGS:  11/02/24: CT of pelvis:  IMPRESSION: 1. Redemonstrated mesenteric fat containing right-sided indirect inguinal hernia measuring 5.7 x 3.4 x 2.8 cm, grossly unchanged compared to remote abdominal CT performed in 2014. 2. Scattered colonic diverticulosis without evidence of superimposed acute diverticulitis. 3. Prostatomegaly. 4. Aortic Atherosclerosis (ICD10-I70.0). 5. 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  PATIENT SURVEYS:  11/21/24: Modified Oswestry: 14/50=28% limitation   COGNITION: Overall cognitive status: Within functional limits for tasks assessed     SENSATION: WFL  MUSCLE LENGTH: Limited by 25-50% bil hips   POSTURE: forward head and flexed trunk   PALPATION: Palpable tenderness over Rt lateral hamstrings and gluteals adjacent to greater trochanter  LUMBAR ROM:  WFLs without pain    LOWER EXTREMITY MMT:    Rt hip 4/5, Lt hip 4+/5, knees bil 5/5  LUMBAR SPECIAL TESTS:  Straight leg raise test: Negative   GAIT: Distance walked: 50 Assistive device utilized: None Level of assistance: Complete Independence Comments: flexed trunk   TREATMENT DATE:  11/30/24:  Nustep x 6 min level 5- PT present to discuss progress  Hamstring stretch at stair 2 x 30 bilateral  Standing quad stretch using chair at steps 2x 30 seconds Seated hip IR with purple ball in between knees 3 x 10 Lateral band walks with light green loop 3 laps of 10 steps Bridging 3 x 10 with isometric ball squeeze using purple ball Supine SLR 2 x 10 bilateral Sit to stand  holding 7# DB 2 x10 Leg press (seat 7) 80# 2 x 15 bilateral then unilateral 45# x 15 bilateral  Supine TFL stretch with green strap x 30 sec bilateral  Ice to right hip x 10 min seated in arm chair with pillow to approximate ice to desired area    11/28/24:  Nustep x 6 min level 5- PT present to discuss progress  Standing quad stretch using chair at steps 2x20 seconds   Hip IR/ER combo x 5 each side, 10 sec hold Bridging 2 x 10 with isometric ball squeeze using purple ball Hook lying clam 2 x 10 with yellow loop Side lying clam 2 x 10 no resistance right  Reverse clam 2 x 10 no resistance right Lateral band walks with light green loop 3 laps of 10 steps Ice to right hip x 10 min seated in arm chair with pillow to approximate ice to desired area    11/23/24:  Nustep x 5 min level 4 IT band stretching 5 x 10 sec with knee bent then 5 x 10 sec with knee straight right IT band rolling with pink foam roller 2 x 30 sec right Hip IR/ER combo x 5 each side, 10 sec hold Bridging 2 x 10 with isometric ball squeeze using purple ball Hook lying clam 2 x 10 with yellow loop Side lying clam 2 x 10 no resistance right Reverse clam 2 x 10 no resistance right Lateral band walks with light green loop 3 laps of 10 steps Ice to right hip x 10 min seated in arm chair with pillow to approximate ice to desired area                                                                                                               PATIENT EDUCATION:  Education details: Access Code: [email or web address] Person educated: Patient Education method: Explanation, Demonstration, and Handouts Education comprehension: verbalized understanding and returned demonstration  HOME EXERCISE PROGRAM: Access Code: [email or web address] URL: [email or web address] Date: 11/30/2024 Prepared by: Kristeen Sar  Exercises - Seated Figure 4 Piriformis Stretch  - 3 x daily - 7 x weekly - 1 sets - 3 reps - 20 hold - Seated Hamstring Stretch  -  3 x daily - 7 x weekly - 1 sets - 3 reps - 20 hold - Hip Flexor Stretch on Step  - 3 x daily - 7 x weekly - 1 sets - 3 reps - 20 hold - Supine Hip Internal and External Rotation  - 2 x daily - 7 x weekly - 1 sets - 10 reps - Clamshell  - 1 x daily - 7 x weekly - 1-2 sets - 10 reps - Sidelying Reverse Clamshell  - 1 x daily - 7 x weekly - 1-2 sets - 10 reps - Hooklying Clamshell with Resistance  - 2 x daily - 7 x weekly - 1 sets - 10 reps - Supine Bridge with Mini Swiss Ball Between Knees  - 1 x daily - 7 x weekly - 1-2 sets - 10 reps - 5 hold - Supine Active Straight Leg Raise  - 1 x daily - 7 x weekly - 2-3 sets - 10 reps - Seated Hip Internal Rotation with Ball and Resistance  - 1 x daily - 7 x weekly - 3 sets - 10 reps ASSESSMENT:  CLINICAL IMPRESSION: Mr. Roes continues to verbalize increased hip stiffness in the mornings, but after performing HEP exercises it is improved. Today we continued focusing in hip ROM and strengthening. He enjoy seated him IR ROM exercise and supine SLR so added these to HEP. PT provided verbal and visual cues as needed for correct exercise performance. He tolerated strength progressions well today. He plans to start  going back to the gym next week. Patient should continue to respond well to skilled therapy. Plan to do dry needling next treatment session.  OBJECTIVE IMPAIRMENTS: decreased activity tolerance, decreased ROM, increased fascial restrictions, increased muscle spasms, impaired flexibility, and pain.   ACTIVITY LIMITATIONS: carrying, lifting, sitting, standing, and locomotion level  PARTICIPATION LIMITATIONS: driving and community activity  PERSONAL FACTORS: Time since onset of injury/illness/exacerbation and 1 comorbidity: Rt TKA are also affecting patient's functional outcome.   REHAB POTENTIAL: Good  CLINICAL DECISION MAKING: Stable/uncomplicated  EVALUATION COMPLEXITY: Low   GOALS: Goals reviewed with patient? Yes  SHORT TERM GOALS: Target  date: 12/19/2024    Be independent in initial HEP Baseline: added strength today (11/28/24) Goal status: In progress   2.  Report > or = to 30% reduction in frequency and intensity Rt hip/gluteal pain after sitting  Baseline:  Goal status: INITIAL  3.  Verbalize and demonstrate body mechanics modifications for lumbar protection with lifting objects for daily tasks  Baseline:  Goal status: INITIAL    LONG TERM GOALS: Target date: 01/16/2025    Be independent in advanced HEP Baseline:  Goal status: INITIAL  2.  Improve Modified Oswestry to < or = to 8/50 (16% disability) Baseline: 14/50=28%  Goal status: INITIAL  3.  Lift and carry his guitar amp without limitation due to Rt hip pain Baseline:  Goal status: INITIAL  4.  Report > or = to 70% reduction in frequency and intensity Rt hip/gluteal pain after sitting  Baseline:  Goal status: INITIAL    PLAN:  PT FREQUENCY: 2x/week  PT DURATION: 8 weeks  PLANNED INTERVENTIONS: 97110-Therapeutic exercises, 97530- Therapeutic activity, 97112- Neuromuscular re-education, 97535- Self Care, [id number]- Manual therapy, [phone]- Gait training, [phone]- Canalith repositioning, [id number]- Aquatic Therapy, [phone]- Ionotophoresis 4mg /ml Dexamethasone , [id number] (1-2 muscles), [phone] (3+ muscles)- Dry Needling, Patient/Family education, Balance training, Stair training, Taping, Joint mobilization, Joint manipulation, Spinal manipulation, Spinal mobilization, Vestibular training, Cryotherapy, and Moist heat.  PLAN FOR NEXT SESSION:  Dry needling to Rt lateral hamstrings and gluteals Nustep, Rt hip flexibility, lumbar flexibility,   Kristeen Sar, PT, DPT 11/30/24 10:48 AM [facility] [address], [address] [place], [place] [address] Phone # [phone] Fax [phone]  "

## 2024-12-04 ENCOUNTER — Ambulatory Visit

## 2024-12-06 ENCOUNTER — Ambulatory Visit

## 2024-12-11 ENCOUNTER — Ambulatory Visit

## 2024-12-13 ENCOUNTER — Ambulatory Visit: Admitting: Physical Therapy

## 2024-12-18 ENCOUNTER — Ambulatory Visit: Admitting: Physical Therapy

## 2024-12-20 ENCOUNTER — Ambulatory Visit

## 2024-12-25 ENCOUNTER — Ambulatory Visit: Admitting: Physical Therapy

## 2024-12-27 ENCOUNTER — Ambulatory Visit: Admitting: Physical Therapy

## 2025-01-01 ENCOUNTER — Ambulatory Visit: Admitting: Physical Therapy

## 2025-01-03 ENCOUNTER — Ambulatory Visit: Admitting: Physical Therapy

## 2025-01-08 ENCOUNTER — Ambulatory Visit: Admitting: Physical Therapy

## 2025-01-10 ENCOUNTER — Ambulatory Visit: Admitting: Physical Therapy

## 2025-01-15 ENCOUNTER — Ambulatory Visit: Admitting: Physical Therapy
